# Patient Record
Sex: Female | Born: 1965 | Race: Black or African American | Hispanic: No | Marital: Married | Smoking: Never smoker
Health system: Southern US, Community
[De-identification: ages and names within clinical notes are randomized; demographics above are authoritative.]

## PROBLEM LIST (undated history)

## (undated) ENCOUNTER — Ambulatory Visit

## (undated) DIAGNOSIS — M5137 Other intervertebral disc degeneration, lumbosacral region: Secondary | ICD-10-CM

## (undated) DIAGNOSIS — D649 Anemia, unspecified: Secondary | ICD-10-CM

## (undated) DIAGNOSIS — K219 Gastro-esophageal reflux disease without esophagitis: Secondary | ICD-10-CM

## (undated) DIAGNOSIS — I4949 Other premature depolarization: Secondary | ICD-10-CM

## (undated) DIAGNOSIS — M25569 Pain in unspecified knee: Secondary | ICD-10-CM

## (undated) DIAGNOSIS — N83209 Unspecified ovarian cyst, unspecified side: Secondary | ICD-10-CM

## (undated) DIAGNOSIS — M5126 Other intervertebral disc displacement, lumbar region: Secondary | ICD-10-CM

## (undated) DIAGNOSIS — Z1509 Genetic susceptibility to other malignant neoplasm: Secondary | ICD-10-CM

## (undated) DIAGNOSIS — R112 Nausea with vomiting, unspecified: Secondary | ICD-10-CM

## (undated) DIAGNOSIS — Z15068 Genetic susceptibility to other malignant neoplasm of digestive system: Secondary | ICD-10-CM

## (undated) DIAGNOSIS — K869 Disease of pancreas, unspecified: Secondary | ICD-10-CM

## (undated) DIAGNOSIS — Z973 Presence of spectacles and contact lenses: Secondary | ICD-10-CM

## (undated) DIAGNOSIS — B009 Herpesviral infection, unspecified: Secondary | ICD-10-CM

## (undated) DIAGNOSIS — K635 Polyp of colon: Secondary | ICD-10-CM

## (undated) DIAGNOSIS — R5383 Other fatigue: Secondary | ICD-10-CM

## (undated) DIAGNOSIS — J00 Acute nasopharyngitis [common cold]: Secondary | ICD-10-CM

## (undated) DIAGNOSIS — T7840XA Allergy, unspecified, initial encounter: Secondary | ICD-10-CM

## (undated) DIAGNOSIS — L0291 Cutaneous abscess, unspecified: Secondary | ICD-10-CM

## (undated) DIAGNOSIS — Z8719 Personal history of other diseases of the digestive system: Secondary | ICD-10-CM

## (undated) DIAGNOSIS — K5909 Other constipation: Secondary | ICD-10-CM

## (undated) DIAGNOSIS — R42 Dizziness and giddiness: Secondary | ICD-10-CM

## (undated) DIAGNOSIS — G44209 Tension-type headache, unspecified, not intractable: Secondary | ICD-10-CM

## (undated) DIAGNOSIS — L0390 Cellulitis, unspecified: Secondary | ICD-10-CM

## (undated) DIAGNOSIS — J309 Allergic rhinitis, unspecified: Secondary | ICD-10-CM

## (undated) DIAGNOSIS — R21 Rash and other nonspecific skin eruption: Secondary | ICD-10-CM

## (undated) DIAGNOSIS — R0602 Shortness of breath: Secondary | ICD-10-CM

## (undated) DIAGNOSIS — R002 Palpitations: Secondary | ICD-10-CM

## (undated) DIAGNOSIS — I83893 Varicose veins of bilateral lower extremities with other complications: Secondary | ICD-10-CM

## (undated) DIAGNOSIS — M5432 Sciatica, left side: Secondary | ICD-10-CM

## (undated) DIAGNOSIS — R1011 Right upper quadrant pain: Secondary | ICD-10-CM

## (undated) DIAGNOSIS — D573 Sickle-cell trait: Secondary | ICD-10-CM

## (undated) DIAGNOSIS — Z8672 Personal history of thrombophlebitis: Secondary | ICD-10-CM

## (undated) DIAGNOSIS — R5381 Other malaise: Secondary | ICD-10-CM

## (undated) DIAGNOSIS — G43909 Migraine, unspecified, not intractable, without status migrainosus: Secondary | ICD-10-CM

## (undated) DIAGNOSIS — M549 Dorsalgia, unspecified: Secondary | ICD-10-CM

## (undated) DIAGNOSIS — R079 Chest pain, unspecified: Secondary | ICD-10-CM

## (undated) DIAGNOSIS — Z9889 Other specified postprocedural states: Secondary | ICD-10-CM

## (undated) DIAGNOSIS — F419 Anxiety disorder, unspecified: Secondary | ICD-10-CM

## (undated) HISTORY — DX: Tension-type headache, unspecified, not intractable: G44.209

## (undated) HISTORY — DX: Rash and other nonspecific skin eruption: R21

## (undated) HISTORY — PX: UPPER GASTROINTESTINAL ENDOSCOPY: SHX188

## (undated) HISTORY — DX: Disease of pancreas, unspecified: K86.9

## (undated) HISTORY — DX: Cellulitis, unspecified: L03.90

## (undated) HISTORY — DX: Anxiety disorder, unspecified: F41.9

## (undated) HISTORY — DX: Allergy, unspecified, initial encounter: T78.40XA

## (undated) HISTORY — DX: Cutaneous abscess, unspecified: L02.91

## (undated) HISTORY — DX: Pain in unspecified knee: M25.569

## (undated) HISTORY — DX: Sickle-cell trait: D57.3

## (undated) HISTORY — DX: Other premature depolarization: I49.49

## (undated) HISTORY — DX: Other malaise: R53.81

## (undated) HISTORY — DX: Migraine, unspecified, not intractable, without status migrainosus: G43.909

## (undated) HISTORY — DX: Shortness of breath: R06.02

## (undated) HISTORY — DX: Other constipation: K59.09

## (undated) HISTORY — DX: Personal history of thrombophlebitis: Z86.72

## (undated) HISTORY — DX: Right upper quadrant pain: R10.11

## (undated) HISTORY — DX: Polyp of colon: K63.5

## (undated) HISTORY — DX: Dorsalgia, unspecified: M54.9

## (undated) HISTORY — DX: Other fatigue: R53.83

## (undated) HISTORY — DX: Gastro-esophageal reflux disease without esophagitis: K21.9

## (undated) HISTORY — DX: Varicose veins of bilateral lower extremities with other complications: I83.893

## (undated) HISTORY — DX: Dizziness and giddiness: R42

## (undated) HISTORY — DX: Palpitations: R00.2

## (undated) HISTORY — DX: Unspecified ovarian cyst, unspecified side: N83.209

## (undated) HISTORY — DX: Chest pain, unspecified: R07.9

## (undated) HISTORY — DX: Acute nasopharyngitis (common cold): J00

## (undated) HISTORY — DX: Sciatica, left side: M54.32

## (undated) HISTORY — DX: Other intervertebral disc degeneration, lumbosacral region: M51.37

## (undated) HISTORY — DX: Allergic rhinitis, unspecified: J30.9

---

## 1998-07-02 ENCOUNTER — Encounter: Payer: Self-pay | Admitting: Internal Medicine

## 1998-07-02 ENCOUNTER — Ambulatory Visit (HOSPITAL_COMMUNITY): Admission: RE | Admit: 1998-07-02 | Discharge: 1998-07-02 | Payer: Self-pay | Admitting: Internal Medicine

## 1999-09-17 ENCOUNTER — Other Ambulatory Visit: Admission: RE | Admit: 1999-09-17 | Discharge: 1999-09-17 | Payer: Self-pay | Admitting: Obstetrics and Gynecology

## 2000-10-05 ENCOUNTER — Other Ambulatory Visit: Admission: RE | Admit: 2000-10-05 | Discharge: 2000-10-05 | Payer: Self-pay | Admitting: Obstetrics and Gynecology

## 2001-09-27 ENCOUNTER — Other Ambulatory Visit: Admission: RE | Admit: 2001-09-27 | Discharge: 2001-09-27 | Payer: Self-pay | Admitting: Obstetrics and Gynecology

## 2001-10-10 ENCOUNTER — Encounter: Admission: RE | Admit: 2001-10-10 | Discharge: 2001-10-10 | Payer: Self-pay | Admitting: Internal Medicine

## 2001-10-10 ENCOUNTER — Encounter: Payer: Self-pay | Admitting: Internal Medicine

## 2002-07-19 ENCOUNTER — Encounter: Payer: Self-pay | Admitting: Internal Medicine

## 2002-09-03 ENCOUNTER — Other Ambulatory Visit: Admission: RE | Admit: 2002-09-03 | Discharge: 2002-09-03 | Payer: Self-pay | Admitting: Gynecology

## 2002-10-11 ENCOUNTER — Encounter: Admission: RE | Admit: 2002-10-11 | Discharge: 2003-01-09 | Payer: Self-pay | Admitting: Gynecology

## 2003-04-16 ENCOUNTER — Inpatient Hospital Stay (HOSPITAL_COMMUNITY): Admission: AD | Admit: 2003-04-16 | Discharge: 2003-04-20 | Payer: Self-pay | Admitting: Gynecology

## 2003-04-17 ENCOUNTER — Encounter (INDEPENDENT_AMBULATORY_CARE_PROVIDER_SITE_OTHER): Payer: Self-pay

## 2003-05-27 ENCOUNTER — Other Ambulatory Visit: Admission: RE | Admit: 2003-05-27 | Discharge: 2003-05-27 | Payer: Self-pay | Admitting: Gynecology

## 2003-08-25 ENCOUNTER — Encounter: Admission: RE | Admit: 2003-08-25 | Discharge: 2003-08-25 | Payer: Self-pay | Admitting: Family Medicine

## 2004-05-28 ENCOUNTER — Ambulatory Visit: Payer: Self-pay | Admitting: Internal Medicine

## 2004-08-06 ENCOUNTER — Other Ambulatory Visit: Admission: RE | Admit: 2004-08-06 | Discharge: 2004-08-06 | Payer: Self-pay | Admitting: Internal Medicine

## 2004-08-06 ENCOUNTER — Ambulatory Visit: Payer: Self-pay | Admitting: Internal Medicine

## 2004-11-09 ENCOUNTER — Ambulatory Visit: Payer: Self-pay | Admitting: Cardiology

## 2004-11-09 ENCOUNTER — Ambulatory Visit: Payer: Self-pay | Admitting: Internal Medicine

## 2004-11-12 ENCOUNTER — Ambulatory Visit: Payer: Self-pay | Admitting: Internal Medicine

## 2004-12-02 ENCOUNTER — Encounter: Admission: RE | Admit: 2004-12-02 | Discharge: 2004-12-02 | Payer: Self-pay | Admitting: Emergency Medicine

## 2005-03-30 ENCOUNTER — Ambulatory Visit: Payer: Self-pay | Admitting: *Deleted

## 2005-08-12 ENCOUNTER — Other Ambulatory Visit: Admission: RE | Admit: 2005-08-12 | Discharge: 2005-08-12 | Payer: Self-pay | Admitting: Obstetrics and Gynecology

## 2006-08-10 ENCOUNTER — Emergency Department (HOSPITAL_COMMUNITY): Admission: EM | Admit: 2006-08-10 | Discharge: 2006-08-10 | Payer: Self-pay | Admitting: Emergency Medicine

## 2006-08-11 ENCOUNTER — Encounter: Payer: Self-pay | Admitting: Vascular Surgery

## 2006-08-11 ENCOUNTER — Ambulatory Visit: Payer: Self-pay | Admitting: Internal Medicine

## 2006-08-11 ENCOUNTER — Inpatient Hospital Stay (HOSPITAL_COMMUNITY): Admission: AD | Admit: 2006-08-11 | Discharge: 2006-08-13 | Payer: Self-pay | Admitting: Internal Medicine

## 2006-08-11 ENCOUNTER — Ambulatory Visit (HOSPITAL_COMMUNITY): Admission: RE | Admit: 2006-08-11 | Discharge: 2006-08-11 | Payer: Self-pay | Admitting: Emergency Medicine

## 2006-08-15 LAB — CONVERTED CEMR LAB
Basophils Absolute: 0 10*3/uL (ref 0.0–0.1)
Eosinophils Absolute: 0.1 10*3/uL (ref 0.0–0.6)
Eosinophils Relative: 2.8 % (ref 0.0–5.0)
Lymphocytes Relative: 42.5 % (ref 12.0–46.0)
MCHC: 33.3 g/dL (ref 30.0–36.0)
MCV: 82.1 fL (ref 78.0–100.0)
Neutro Abs: 2.2 10*3/uL (ref 1.4–7.7)
Platelets: 244 10*3/uL (ref 150–400)
RBC: 4.9 M/uL (ref 3.87–5.11)
WBC: 4.3 10*3/uL — ABNORMAL LOW (ref 4.5–10.5)

## 2006-08-18 ENCOUNTER — Ambulatory Visit: Payer: Self-pay | Admitting: Internal Medicine

## 2006-08-19 ENCOUNTER — Ambulatory Visit: Payer: Self-pay | Admitting: Internal Medicine

## 2006-08-24 ENCOUNTER — Ambulatory Visit: Payer: Self-pay | Admitting: Cardiology

## 2006-08-31 ENCOUNTER — Ambulatory Visit: Payer: Self-pay | Admitting: *Deleted

## 2006-09-05 ENCOUNTER — Emergency Department (HOSPITAL_COMMUNITY): Admission: EM | Admit: 2006-09-05 | Discharge: 2006-09-06 | Payer: Self-pay | Admitting: Emergency Medicine

## 2006-09-06 ENCOUNTER — Emergency Department (HOSPITAL_COMMUNITY): Admission: EM | Admit: 2006-09-06 | Discharge: 2006-09-06 | Payer: Self-pay | Admitting: Emergency Medicine

## 2006-09-07 ENCOUNTER — Ambulatory Visit: Payer: Self-pay | Admitting: Cardiovascular Disease

## 2006-09-14 ENCOUNTER — Ambulatory Visit: Payer: Self-pay | Admitting: Internal Medicine

## 2006-09-23 ENCOUNTER — Ambulatory Visit: Payer: Self-pay | Admitting: Cardiology

## 2006-09-27 ENCOUNTER — Ambulatory Visit: Payer: Self-pay | Admitting: Internal Medicine

## 2006-10-05 ENCOUNTER — Ambulatory Visit: Payer: Self-pay | Admitting: Cardiovascular Disease

## 2006-10-19 ENCOUNTER — Ambulatory Visit: Payer: Self-pay | Admitting: Cardiology

## 2006-10-21 ENCOUNTER — Ambulatory Visit: Payer: Self-pay | Admitting: Internal Medicine

## 2006-11-04 ENCOUNTER — Ambulatory Visit: Payer: Self-pay | Admitting: Cardiology

## 2006-11-11 ENCOUNTER — Ambulatory Visit: Payer: Self-pay | Admitting: Cardiology

## 2006-11-17 ENCOUNTER — Ambulatory Visit: Payer: Self-pay | Admitting: Cardiovascular Disease

## 2006-11-24 ENCOUNTER — Ambulatory Visit: Payer: Self-pay | Admitting: Cardiology

## 2006-11-28 ENCOUNTER — Ambulatory Visit: Payer: Self-pay | Admitting: Internal Medicine

## 2006-11-28 LAB — CONVERTED CEMR LAB
Basophils Relative: 1.1 % — ABNORMAL HIGH (ref 0.0–1.0)
Eosinophils Absolute: 0.1 10*3/uL (ref 0.0–0.6)
Lymphocytes Relative: 39.4 % (ref 12.0–46.0)
MCV: 79.6 fL (ref 78.0–100.0)
Monocytes Relative: 7.8 % (ref 3.0–11.0)
Neutro Abs: 2.2 10*3/uL (ref 1.4–7.7)
Platelets: 300 10*3/uL (ref 150–400)
Prothrombin Time: 18.4 s — ABNORMAL HIGH (ref 10.0–14.0)
RBC: 4.82 M/uL (ref 3.87–5.11)

## 2006-12-02 ENCOUNTER — Ambulatory Visit: Payer: Self-pay | Admitting: Cardiology

## 2006-12-06 ENCOUNTER — Ambulatory Visit: Payer: Self-pay | Admitting: Cardiology

## 2006-12-06 ENCOUNTER — Ambulatory Visit: Payer: Self-pay

## 2006-12-19 ENCOUNTER — Ambulatory Visit: Payer: Self-pay | Admitting: Internal Medicine

## 2006-12-19 ENCOUNTER — Ambulatory Visit: Payer: Self-pay | Admitting: Cardiovascular Disease

## 2006-12-29 ENCOUNTER — Ambulatory Visit: Payer: Self-pay | Admitting: Cardiology

## 2007-01-13 ENCOUNTER — Ambulatory Visit: Payer: Self-pay | Admitting: Cardiology

## 2007-01-17 ENCOUNTER — Ambulatory Visit: Payer: Self-pay | Admitting: Cardiology

## 2007-01-30 ENCOUNTER — Ambulatory Visit: Payer: Self-pay | Admitting: Internal Medicine

## 2007-02-27 ENCOUNTER — Ambulatory Visit: Payer: Self-pay | Admitting: Internal Medicine

## 2007-02-27 LAB — CONVERTED CEMR LAB
AntiThromb III Func: 107 % (ref 76–126)
Anticardiolipin IgG: <7 (ref <11)
Homocysteine: 11.3 micromoles/L (ref 4.0–15.4)

## 2007-03-01 ENCOUNTER — Ambulatory Visit: Payer: Self-pay | Admitting: Internal Medicine

## 2007-03-01 ENCOUNTER — Ambulatory Visit: Payer: Self-pay | Admitting: *Deleted

## 2007-03-01 ENCOUNTER — Inpatient Hospital Stay (HOSPITAL_COMMUNITY): Admission: EM | Admit: 2007-03-01 | Discharge: 2007-03-03 | Payer: Self-pay | Admitting: Emergency Medicine

## 2007-03-02 ENCOUNTER — Encounter: Payer: Self-pay | Admitting: Cardiology

## 2007-03-06 ENCOUNTER — Ambulatory Visit: Payer: Self-pay

## 2007-03-06 ENCOUNTER — Ambulatory Visit: Payer: Self-pay | Admitting: Cardiology

## 2007-03-07 ENCOUNTER — Encounter: Payer: Self-pay | Admitting: Internal Medicine

## 2007-03-07 DIAGNOSIS — F411 Generalized anxiety disorder: Secondary | ICD-10-CM | POA: Insufficient documentation

## 2007-03-21 ENCOUNTER — Ambulatory Visit: Payer: Self-pay | Admitting: Cardiology

## 2007-04-05 ENCOUNTER — Ambulatory Visit: Payer: Self-pay | Admitting: Internal Medicine

## 2007-04-05 ENCOUNTER — Ambulatory Visit: Payer: Self-pay

## 2007-04-07 ENCOUNTER — Ambulatory Visit: Payer: Self-pay | Admitting: Cardiovascular Disease

## 2007-04-09 ENCOUNTER — Encounter: Payer: Self-pay | Admitting: Internal Medicine

## 2007-04-09 DIAGNOSIS — K5909 Other constipation: Secondary | ICD-10-CM

## 2007-04-09 DIAGNOSIS — D573 Sickle-cell trait: Secondary | ICD-10-CM

## 2007-04-09 DIAGNOSIS — G44209 Tension-type headache, unspecified, not intractable: Secondary | ICD-10-CM | POA: Insufficient documentation

## 2007-04-09 DIAGNOSIS — Z8672 Personal history of thrombophlebitis: Secondary | ICD-10-CM

## 2007-04-09 DIAGNOSIS — G43909 Migraine, unspecified, not intractable, without status migrainosus: Secondary | ICD-10-CM | POA: Insufficient documentation

## 2007-04-09 HISTORY — DX: Other constipation: K59.09

## 2007-04-09 HISTORY — DX: Migraine, unspecified, not intractable, without status migrainosus: G43.909

## 2007-04-09 HISTORY — DX: Personal history of thrombophlebitis: Z86.72

## 2007-04-09 HISTORY — DX: Sickle-cell trait: D57.3

## 2007-04-09 HISTORY — DX: Tension-type headache, unspecified, not intractable: G44.209

## 2007-04-10 ENCOUNTER — Ambulatory Visit (HOSPITAL_COMMUNITY): Admission: RE | Admit: 2007-04-10 | Discharge: 2007-04-10 | Payer: Self-pay | Admitting: Cardiovascular Disease

## 2007-04-19 ENCOUNTER — Ambulatory Visit: Payer: Self-pay | Admitting: Internal Medicine

## 2007-05-12 ENCOUNTER — Ambulatory Visit: Payer: Self-pay | Admitting: Internal Medicine

## 2007-05-12 DIAGNOSIS — M51379 Other intervertebral disc degeneration, lumbosacral region without mention of lumbar back pain or lower extremity pain: Secondary | ICD-10-CM

## 2007-05-12 DIAGNOSIS — M5137 Other intervertebral disc degeneration, lumbosacral region: Secondary | ICD-10-CM

## 2007-05-12 HISTORY — DX: Other intervertebral disc degeneration, lumbosacral region: M51.37

## 2007-05-12 HISTORY — DX: Other intervertebral disc degeneration, lumbosacral region without mention of lumbar back pain or lower extremity pain: M51.379

## 2007-08-10 ENCOUNTER — Telehealth: Payer: Self-pay | Admitting: Internal Medicine

## 2007-09-11 ENCOUNTER — Ambulatory Visit: Payer: Self-pay | Admitting: Internal Medicine

## 2007-09-11 DIAGNOSIS — M25569 Pain in unspecified knee: Secondary | ICD-10-CM | POA: Insufficient documentation

## 2007-09-11 DIAGNOSIS — I83893 Varicose veins of bilateral lower extremities with other complications: Secondary | ICD-10-CM

## 2007-09-11 HISTORY — DX: Pain in unspecified knee: M25.569

## 2007-09-11 HISTORY — DX: Varicose veins of bilateral lower extremities with other complications: I83.893

## 2007-09-12 ENCOUNTER — Encounter: Payer: Self-pay | Admitting: Internal Medicine

## 2007-09-20 ENCOUNTER — Encounter: Payer: Self-pay | Admitting: Internal Medicine

## 2008-01-17 ENCOUNTER — Ambulatory Visit: Payer: Self-pay | Admitting: Internal Medicine

## 2008-01-17 ENCOUNTER — Telehealth (INDEPENDENT_AMBULATORY_CARE_PROVIDER_SITE_OTHER): Payer: Self-pay | Admitting: *Deleted

## 2008-01-17 DIAGNOSIS — R21 Rash and other nonspecific skin eruption: Secondary | ICD-10-CM

## 2008-01-17 HISTORY — DX: Rash and other nonspecific skin eruption: R21

## 2008-03-13 ENCOUNTER — Ambulatory Visit: Payer: Self-pay | Admitting: Internal Medicine

## 2008-03-13 DIAGNOSIS — J3501 Chronic tonsillitis: Secondary | ICD-10-CM

## 2008-04-04 ENCOUNTER — Encounter: Payer: Self-pay | Admitting: Internal Medicine

## 2008-05-20 ENCOUNTER — Ambulatory Visit: Payer: Self-pay | Admitting: Internal Medicine

## 2008-05-20 DIAGNOSIS — R5381 Other malaise: Secondary | ICD-10-CM | POA: Insufficient documentation

## 2008-05-20 DIAGNOSIS — R002 Palpitations: Secondary | ICD-10-CM | POA: Insufficient documentation

## 2008-05-20 DIAGNOSIS — R5383 Other fatigue: Secondary | ICD-10-CM

## 2008-05-20 DIAGNOSIS — R0602 Shortness of breath: Secondary | ICD-10-CM | POA: Insufficient documentation

## 2008-05-20 HISTORY — DX: Palpitations: R00.2

## 2008-05-20 HISTORY — DX: Other malaise: R53.81

## 2008-05-20 HISTORY — DX: Shortness of breath: R06.02

## 2008-05-22 ENCOUNTER — Emergency Department (HOSPITAL_BASED_OUTPATIENT_CLINIC_OR_DEPARTMENT_OTHER): Admission: EM | Admit: 2008-05-22 | Discharge: 2008-05-22 | Payer: Self-pay | Admitting: Emergency Medicine

## 2008-06-03 ENCOUNTER — Ambulatory Visit: Payer: Self-pay | Admitting: Internal Medicine

## 2008-06-10 ENCOUNTER — Ambulatory Visit: Payer: Self-pay

## 2008-06-10 ENCOUNTER — Encounter: Payer: Self-pay | Admitting: Internal Medicine

## 2008-06-20 ENCOUNTER — Telehealth: Payer: Self-pay | Admitting: Internal Medicine

## 2008-07-16 ENCOUNTER — Encounter (INDEPENDENT_AMBULATORY_CARE_PROVIDER_SITE_OTHER): Payer: Self-pay | Admitting: Cardiology

## 2008-07-16 ENCOUNTER — Ambulatory Visit (HOSPITAL_COMMUNITY): Admission: RE | Admit: 2008-07-16 | Discharge: 2008-07-16 | Payer: Self-pay | Admitting: Cardiology

## 2008-08-08 ENCOUNTER — Encounter: Payer: Self-pay | Admitting: Internal Medicine

## 2008-08-15 ENCOUNTER — Ambulatory Visit: Payer: Self-pay | Admitting: Internal Medicine

## 2008-08-15 DIAGNOSIS — R42 Dizziness and giddiness: Secondary | ICD-10-CM | POA: Insufficient documentation

## 2008-08-15 HISTORY — DX: Dizziness and giddiness: R42

## 2008-08-19 ENCOUNTER — Ambulatory Visit: Payer: Self-pay

## 2008-08-21 ENCOUNTER — Ambulatory Visit: Payer: Self-pay | Admitting: Internal Medicine

## 2008-08-21 ENCOUNTER — Ambulatory Visit: Payer: Self-pay | Admitting: Cardiology

## 2008-08-21 ENCOUNTER — Telehealth: Payer: Self-pay | Admitting: Internal Medicine

## 2008-08-21 LAB — CONVERTED CEMR LAB
CO2: 27 meq/L (ref 19–32)
Glucose, Bld: 89 mg/dL (ref 70–99)
Hemoglobin: 12.5 g/dL (ref 12.0–15.0)
Lymphocytes Relative: 37.4 % (ref 12.0–46.0)
Monocytes Relative: 7.2 % (ref 3.0–12.0)
Neutro Abs: 2.1 10*3/uL (ref 1.4–7.7)
Platelets: 209 10*3/uL (ref 150–400)
Potassium: 3.4 meq/L — ABNORMAL LOW (ref 3.5–5.1)
RDW: 14.9 % — ABNORMAL HIGH (ref 11.5–14.6)
Sodium: 141 meq/L (ref 135–145)

## 2008-08-23 ENCOUNTER — Ambulatory Visit: Payer: Self-pay | Admitting: Internal Medicine

## 2008-08-23 DIAGNOSIS — I4949 Other premature depolarization: Secondary | ICD-10-CM

## 2008-08-23 DIAGNOSIS — R079 Chest pain, unspecified: Secondary | ICD-10-CM

## 2008-08-23 DIAGNOSIS — R0789 Other chest pain: Secondary | ICD-10-CM | POA: Insufficient documentation

## 2008-08-23 HISTORY — DX: Other premature depolarization: I49.49

## 2008-08-23 HISTORY — DX: Chest pain, unspecified: R07.9

## 2008-11-04 ENCOUNTER — Telehealth: Payer: Self-pay | Admitting: Internal Medicine

## 2009-03-01 ENCOUNTER — Emergency Department (HOSPITAL_COMMUNITY): Admission: EM | Admit: 2009-03-01 | Discharge: 2009-03-01 | Payer: Self-pay | Admitting: Emergency Medicine

## 2009-07-18 HISTORY — PX: TONSILLECTOMY: SUR1361

## 2009-08-06 ENCOUNTER — Emergency Department (HOSPITAL_COMMUNITY): Admission: EM | Admit: 2009-08-06 | Discharge: 2009-08-06 | Payer: Self-pay | Admitting: Emergency Medicine

## 2009-10-10 ENCOUNTER — Ambulatory Visit: Payer: Self-pay | Admitting: Internal Medicine

## 2010-01-13 ENCOUNTER — Ambulatory Visit: Payer: Self-pay | Admitting: Internal Medicine

## 2010-01-13 DIAGNOSIS — L0291 Cutaneous abscess, unspecified: Secondary | ICD-10-CM

## 2010-01-13 DIAGNOSIS — L0390 Cellulitis, unspecified: Secondary | ICD-10-CM

## 2010-01-13 HISTORY — DX: Cutaneous abscess, unspecified: L02.91

## 2010-02-05 ENCOUNTER — Ambulatory Visit: Payer: Self-pay | Admitting: Internal Medicine

## 2010-02-05 DIAGNOSIS — M549 Dorsalgia, unspecified: Secondary | ICD-10-CM

## 2010-02-05 HISTORY — DX: Dorsalgia, unspecified: M54.9

## 2010-02-21 ENCOUNTER — Emergency Department (HOSPITAL_COMMUNITY): Admission: EM | Admit: 2010-02-21 | Discharge: 2010-02-21 | Payer: Self-pay | Admitting: Family Medicine

## 2010-02-23 ENCOUNTER — Ambulatory Visit: Payer: Self-pay | Admitting: Internal Medicine

## 2010-02-23 ENCOUNTER — Encounter: Admission: RE | Admit: 2010-02-23 | Discharge: 2010-02-23 | Payer: Self-pay | Admitting: Internal Medicine

## 2010-02-23 DIAGNOSIS — R1011 Right upper quadrant pain: Secondary | ICD-10-CM

## 2010-02-23 HISTORY — DX: Right upper quadrant pain: R10.11

## 2010-02-23 LAB — CONVERTED CEMR LAB
AST: 21 units/L (ref 0–37)
Albumin: 4.6 g/dL (ref 3.5–5.2)
Alkaline Phosphatase: 41 units/L (ref 39–117)
Basophils Relative: 1.3 % (ref 0.0–3.0)
Bilirubin, Direct: 0.1 mg/dL (ref 0.0–0.3)
CO2: 29 meq/L (ref 19–32)
Calcium: 9.7 mg/dL (ref 8.4–10.5)
Creatinine, Ser: 0.9 mg/dL (ref 0.4–1.2)
Eosinophils Relative: 2.1 % (ref 0.0–5.0)
GFR calc non Af Amer: 88.16 mL/min (ref >60)
H Pylori IgG: NEGATIVE
Hemoglobin, Urine: NEGATIVE
Hemoglobin: 12.6 g/dL (ref 12.0–15.0)
Ketones, ur: NEGATIVE mg/dL
Lymphocytes Relative: 42.6 % (ref 12.0–46.0)
Monocytes Relative: 6.6 % (ref 3.0–12.0)
Neutro Abs: 1.8 10*3/uL (ref 1.4–7.7)
Neutrophils Relative %: 47.4 % (ref 43.0–77.0)
RBC: 4.94 M/uL (ref 3.87–5.11)
Sodium: 140 meq/L (ref 135–145)
Total Protein, Urine: NEGATIVE mg/dL
Total Protein: 8 g/dL (ref 6.0–8.3)
Urine Glucose: NEGATIVE mg/dL
Urobilinogen, UA: 0.2 (ref 0.0–1.0)
WBC: 3.9 10*3/uL — ABNORMAL LOW (ref 4.5–10.5)

## 2010-05-07 ENCOUNTER — Ambulatory Visit: Payer: Self-pay | Admitting: Internal Medicine

## 2010-05-07 DIAGNOSIS — J00 Acute nasopharyngitis [common cold]: Secondary | ICD-10-CM | POA: Insufficient documentation

## 2010-05-07 HISTORY — DX: Acute nasopharyngitis (common cold): J00

## 2010-05-27 IMAGING — CR DG CHEST 2V
2 series · 2 of 2 positions shown · non-contrast
Comparison: CT chest 03/02/2007

CLINICAL DATA: Chest flutters, shortness of breath.

CHEST - 2 VIEW

[view not recorded (1 of 2)]
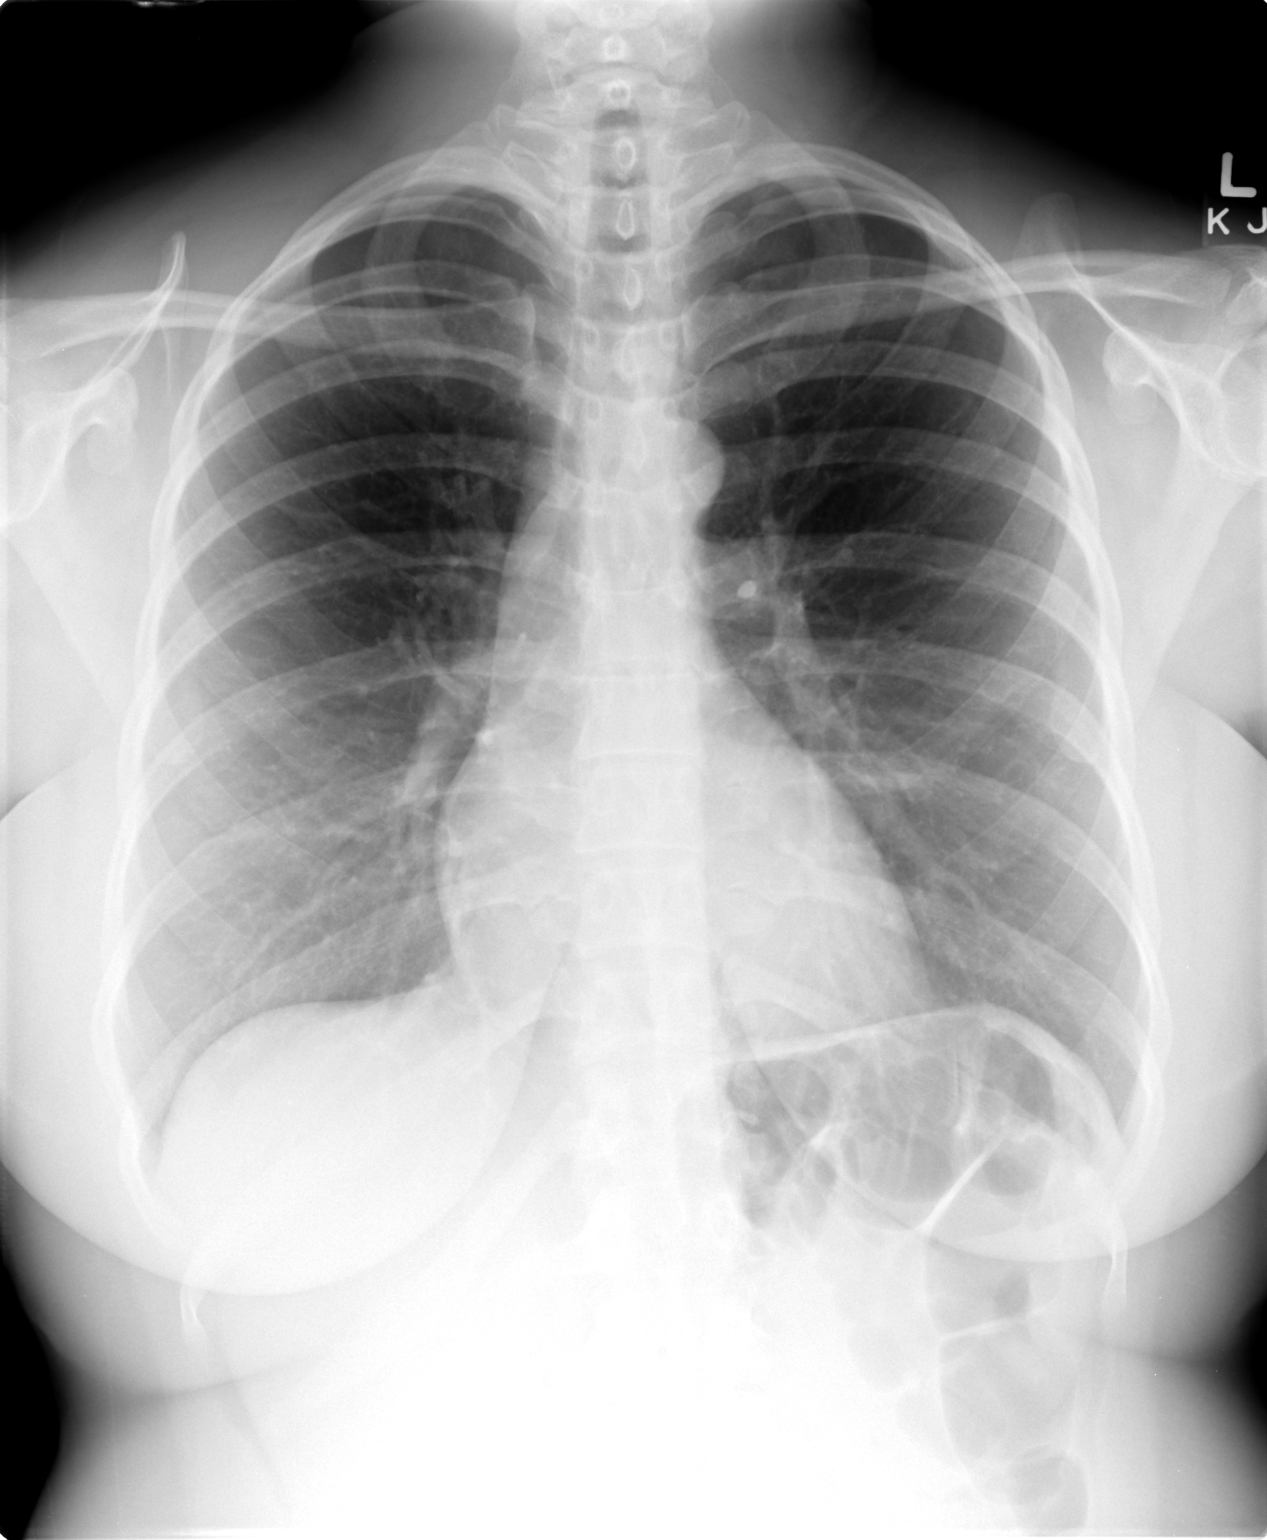

[view not recorded (2 of 2)]
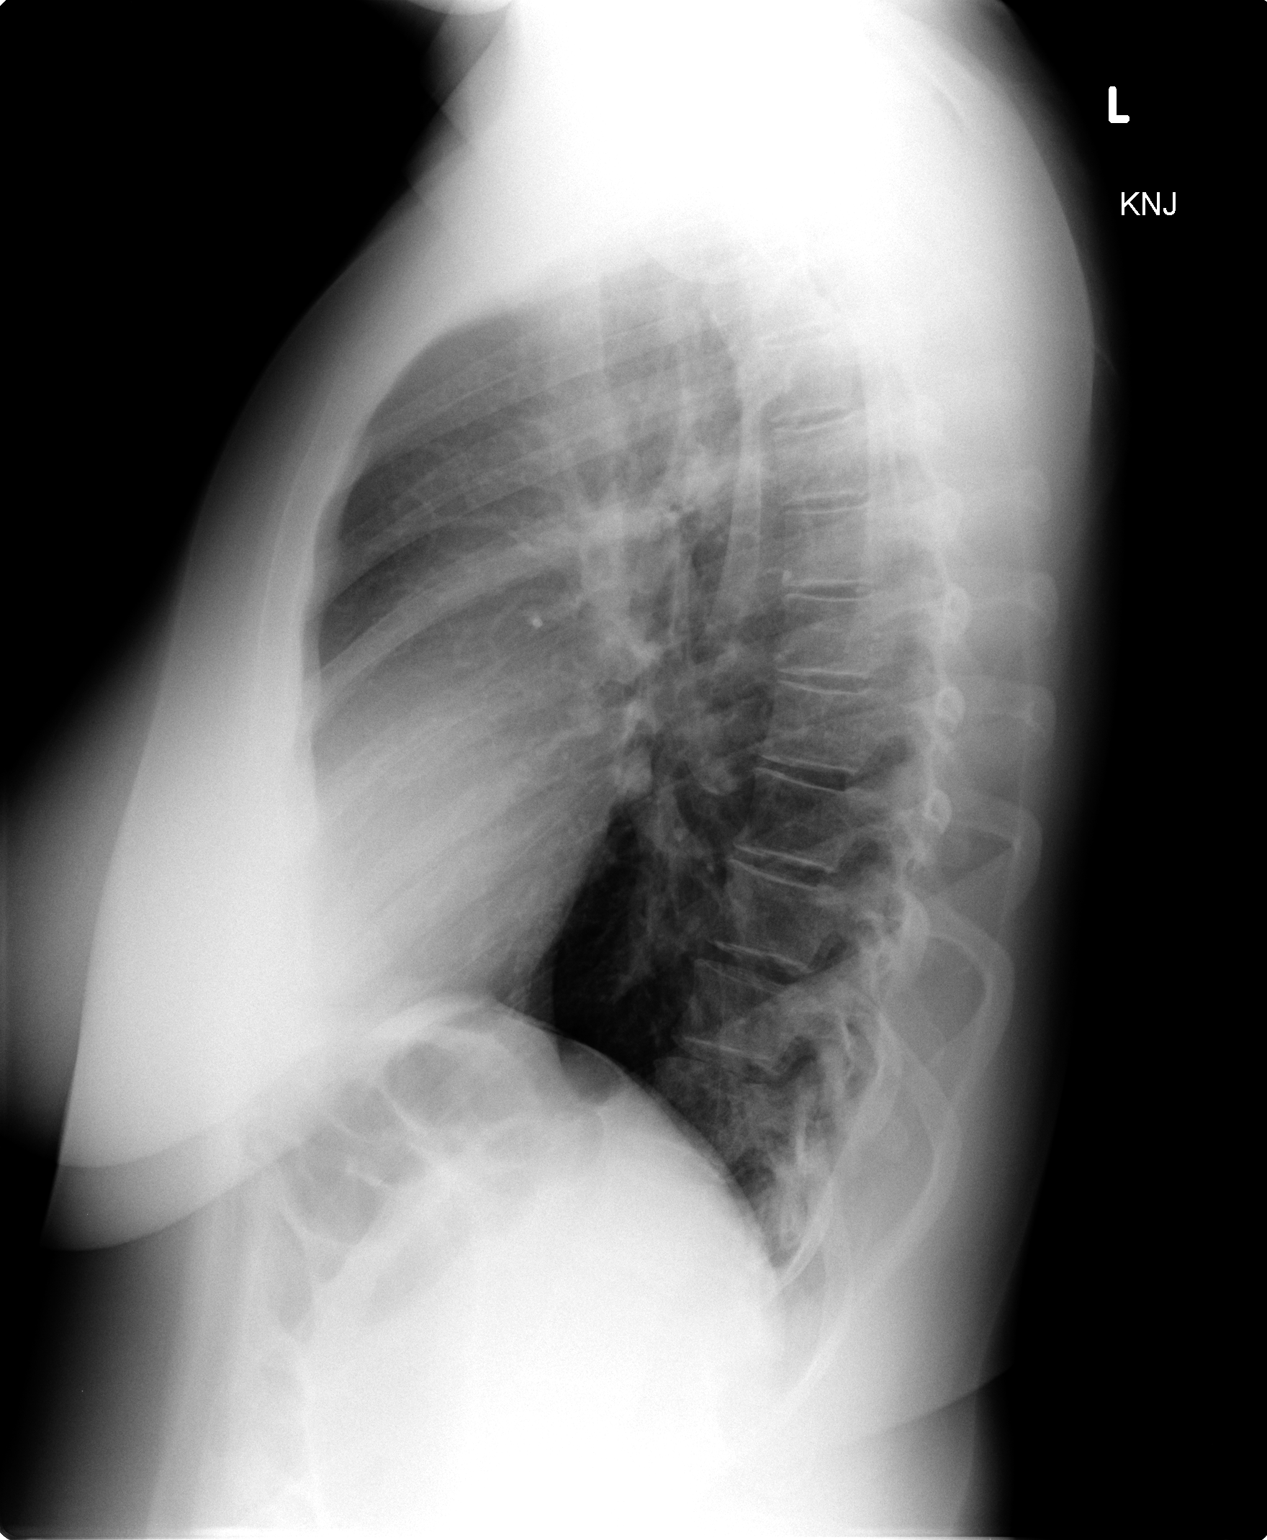

[2 of 2 positions shown; findings below may reference images not displayed]

FINDINGS: Trachea is midline.  Heart size normal.  Lungs are clear.
No pleural fluid.
IMPRESSION: No acute findings.

## 2010-08-09 ENCOUNTER — Encounter: Payer: Self-pay | Admitting: Gastroenterology

## 2010-08-16 LAB — CONVERTED CEMR LAB
ALT: 15 units/L (ref 0–35)
AST: 19 units/L (ref 0–37)
Albumin: 4.5 g/dL (ref 3.5–5.2)
Basophils Absolute: 0.1 10*3/uL (ref 0.0–0.1)
Basophils Relative: 0.7 % (ref 0.0–3.0)
Bilirubin Urine: NEGATIVE
CO2: 29 meq/L (ref 19–32)
CO2: 29 meq/L (ref 19–32)
Calcium: 9.3 mg/dL (ref 8.4–10.5)
Chloride: 105 meq/L (ref 96–112)
Cholesterol: 149 mg/dL (ref 0–200)
Creatinine, Ser: 1 mg/dL (ref 0.4–1.2)
Eosinophils Absolute: 0.1 10*3/uL (ref 0.0–0.7)
Eosinophils Relative: 2.5 % (ref 0.0–5.0)
Folate: 5.3 ng/mL
GFR calc Af Amer: 79 mL/min
Glucose, Bld: 90 mg/dL (ref 70–99)
HCT: 34.3 % — ABNORMAL LOW (ref 36.0–46.0)
HCT: 37.7 % (ref 36.0–46.0)
HDL: 64.6 mg/dL (ref >39.00)
Hemoglobin: 11.6 g/dL — ABNORMAL LOW (ref 12.0–15.0)
Hemoglobin: 12.6 g/dL (ref 12.0–15.0)
Iron: 119 ug/dL (ref 42–145)
Lymphs Abs: 1.8 10*3/uL (ref 0.7–4.0)
MCHC: 33.8 g/dL (ref 30.0–36.0)
Monocytes Relative: 3 % (ref 3.0–12.0)
Neutro Abs: 3.8 10*3/uL (ref 1.4–7.7)
Nitrite: NEGATIVE
Nitrite: NEGATIVE
Potassium: 4.1 meq/L (ref 3.5–5.1)
RBC: 4.7 M/uL (ref 3.87–5.11)
RDW: 18.3 % — ABNORMAL HIGH (ref 11.5–14.6)
Saturation Ratios: 23.9 % (ref 20.0–50.0)
Sed Rate: 10 mm/hr (ref 0–22)
Sodium: 142 meq/L (ref 135–145)
Specific Gravity, Urine: 1.01 (ref 1.000–1.030)
TSH: 1.04 microintl units/mL (ref 0.35–5.50)
Total Bilirubin: 0.8 mg/dL (ref 0.3–1.2)
Total Protein, Urine: NEGATIVE mg/dL
Total Protein, Urine: NEGATIVE mg/dL
Total Protein: 7.8 g/dL (ref 6.0–8.3)
Transferrin: 355 mg/dL (ref 212.0–360.0)
VLDL: 9.4 mg/dL (ref 0.0–40.0)
Vitamin B-12: 373 pg/mL (ref 211–911)
WBC: 4.5 10*3/uL (ref 4.5–10.5)
pH: 5.5 (ref 5.0–8.0)
pH: 7.5 (ref 5.0–8.0)

## 2010-08-20 NOTE — Assessment & Plan Note (Signed)
Summary: EYE INFECTION/NWS   Vital Signs:  Patient profile:   45 year old female Height:      65 inches Weight:      150 pounds BMI:     25.05 O2 Sat:      98 % on Room air Temp:     97.1 degrees F oral Pulse rate:   84 / minute BP sitting:   114 / 76  (left arm) Cuff size:   regular  Vitals Entered ByZella Ball Ewing (October 10, 2009 3:52 PM)  O2 Flow:  Room air CC: eye infection, sore throat/RE   CC:  eye infection and sore throat/RE.  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   No blurred vision or visual problem.    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 1 By Mouth Qd 2)  Alprazolam 0.5 Mg  Tabs (Alprazolam) .Marland Kitchen.. 1 By Mouth Three Times A Day As Needed 3)  Triamcinolone Acetonide 0.5 %  Crea (Triamcinolone Acetonide) ....  Use Asd Two Times A Day As Needed 4)  Metoprolol Tartrate 25 Mg Tabs (Metoprolol Tartrate) .Marland Kitchen.. 1 By Mouth Two Times A Day  Current Medications (verified): 1)  Ecotrin Low Strength 81 Mg  Tbec (Aspirin) .Marland Kitchen.. 1 By Mouth Qd 2)  Alprazolam 0.5 Mg  Tabs (Alprazolam) .Marland Kitchen.. 1 By Mouth Three Times A Day As Needed 3)  Triamcinolone Acetonide 0.5 %  Crea (Triamcinolone Acetonide) .... Use Asd Two Times A Day As Needed 4)  Metoprolol Tartrate 25 Mg Tabs (Metoprolol Tartrate) .Marland Kitchen.. 1 By Mouth Two Times A Day 5)  Azithromycin 200 Mg/71ml Susr (Azithromycin) .Marland Kitchen.. 12.5 Cc By Mouth X 1 Day, Then 10 Cc By Mouth Once Daily For 4 Days 6)  Tobramycin Sulfate 0.3 % Soln (Tobramycin Sulfate) .... 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  , mild ill  Head:  normocephalic and atraumatic.   Eyes:  vision grossly intact, pupils equal, and pupils round., bilat conjucntiva wtih mild erythema and slight d/c   Ears:  bilat tm's mild red, sinus nontender Nose:  nasal dischargemucosal pallor and mucosal edema.   Mouth:  severe pharyngeal erythema and fair dentition.   Neck:  supple and cervical lymphadenopathy.   Lungs:  normal respiratory effort and normal breath sounds.   Heart:  normal rate and regular rhythm.   Extremities:  no edema, no  erythema    Impression & Recommendations:  Problem # 1:  PHARYNGITIS-ACUTE (ICD-462)  Her updated medication list for this problem includes:    Ecotrin Low Strength 81 Mg Tbec (Aspirin) .Marland Kitchen... 1 by mouth qd    Azithromycin 200 Mg/48ml Susr (Azithromycin) .Marland Kitchen... 12.5 cc by mouth x 1 day, then 10 cc by mouth once daily for 4 days treat as above, f/u any worsening signs or symptoms   Problem # 2:  CONJUNCTIVITIS (ICD-372.30)  Her updated medication list for this problem includes:    Tobramycin Sulfate 0.3 % Soln (Tobramycin sulfate) .Marland Kitchen... 2 gtts ou four times per day for 10 days treat as above, f/u any worsening signs or symptoms   Complete Medication List: 1)  Ecotrin Low Strength 81 Mg Tbec (Aspirin) .Marland Kitchen.. 1 by mouth qd 2)  Alprazolam 0.5 Mg Tabs (Alprazolam) .Marland Kitchen.. 1 by mouth three times a day as needed 3)  Triamcinolone Acetonide 0.5 % Crea (Triamcinolone acetonide) .... Use asd two times a day as needed 4)  Metoprolol Tartrate 25 Mg Tabs (Metoprolol tartrate) .Marland Kitchen.. 1 by mouth two times a day 5)  Azithromycin 200 Mg/53ml Susr (Azithromycin) .Marland Kitchen.. 12.5 cc by mouth x 1 day, then 10 cc by mouth once daily for 4 days 6)  Tobramycin Sulfate 0.3 % Soln (Tobramycin sulfate) .... 2 gtts ou four times per day for 10 days  Patient Instructions: 1)  Please take all new medications as prescribed 2)  Continue all previous medications as before this visit  3)  Please schedule a follow-up appointment as needed. Prescriptions: AZITHROMYCIN 200 MG/5ML SUSR (AZITHROMYCIN) 12.5 cc by mouth x 1 day, then 10 cc by mouth once daily for 4 days  #6 days above x 1   Entered and Authorized by:   Corwin Levins MD   Signed by:   Corwin Levins MD on 10/10/2009   Method used:   Electronically to        [facility]  [address] [id number] (retail)       [address]       [place], [place]  [address]       Ph: [phone] or [phone]       Fax: [phone]   RxID:   [id number] TOBRAMYCIN SULFATE  0.3 % SOLN (TOBRAMYCIN SULFATE) 2 gtts OU four times per day for 10 days  #1bottle x 0   Entered and Authorized by:   Corwin Levins MD   Signed by:   Corwin Levins MD on 10/10/2009   Method used:   Print then Give to Patient   RxID:   [id number] AZITHROMYCIN 250 MG TABS (AZITHROMYCIN) 2po qd for 1 day, then 1po qd for 4days, then stop  #6 x 1   Entered and Authorized  by:   Corwin Levins MD   Signed by:   Corwin Levins MD on 10/10/2009   Method used:   Print then Give to Patient   RxID:   [id number]

## 2010-08-20 NOTE — Assessment & Plan Note (Signed)
Summary: TICK BITE/ NWS   Vital Signs:  Patient profile:   45 year old female Height:      65 inches Weight:      150.25 pounds BMI:     25.09 O2 Sat:      97 % on Room air Temp:     98.4 degrees F oral Pulse rate:   86 / minute BP sitting:   100 / 70  (left arm) Cuff size:   regular  Vitals Entered By: Zella Ball Ewing CMA Duncan Dull) (January 13, 2010 2:54 PM)  O2 Flow:  Room air CC: Tick Bite/RE   CC:  Tick Bite/RE.  History of Present Illness: here after found a tick to the right neck base, removed several days ago, but since then has incrased local erythema, tender , swelling, headache, malaise, and an episode of vomiting yesterday as well; right neck seems "sore and hard".  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 1 By Mouth Qd 2)  Alprazolam 0.5 Mg  Tabs (Alprazolam) .Marland Kitchen.. 1 By Mouth Three Times A Day As Needed 3)  Triamcinolone Acetonide 0.5 %  Crea (Triamcinolone Acetonide) .... Use Asd Two Times A Day As Needed 4)  Metoprolol Tartrate 25 Mg  Tabs (Metoprolol Tartrate) .Marland Kitchen.. 1 By Mouth Two Times A Day 5)  Azithromycin 200 Mg/72ml Susr (Azithromycin) .Marland Kitchen.. 12.5 Cc By Mouth X 1 Day, Then 10 Cc By Mouth Once Daily For 4 Days 6)  Tobramycin Sulfate 0.3 % Soln (Tobramycin Sulfate) .... 2 Gtts Ou Four Times Per Day For 10 Days  Current Medications (verified): 1)  Ecotrin Low Strength 81 Mg  Tbec (Aspirin) .Marland Kitchen.. 1 By Mouth Qd 2)  Doxycycline Hyclate 100 Mg Caps (Doxycycline Hyclate) .Marland Kitchen.. 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   Head:  normocephalic and atraumatic.   Eyes:  vision grossly intact, pupils equal, and pupils round.   Ears:  R ear normal and L  ear normal.   Nose:  no external deformity and no nasal discharge.   Mouth:  no gingival abnormalities and pharynx pink and moist.   Neck:  supple and no masses.   Lungs:  normal respiratory effort and normal breath sounds.   Heart:  normal rate and regular rhythm.   Extremities:  no edema, no erythema  Skin:  right neck with right scm lower third with tender LA, and mild erythema/tender approx  3 cm area nondiscrete, no ulceration   Impression & Recommendations:  Problem # 1:  CELLULITIS (ICD-682.9)  The following medications were removed from the medication list:    Azithromycin 200 Mg/62ml Susr (Azithromycin)  .Marland Kitchen... 12.5 cc by mouth x 1 day, then 10 cc by mouth once daily for 4 days Her updated medication list for this problem includes:    Doxycycline Hyclate 100 Mg Caps (Doxycycline hyclate) .Marland Kitchen... 1 by mouth two times a day right base of neck - ? tick related - for doxy course, check labs per pt request  Orders: T-Lyme Disease [id number]) T-[place] Spotted Fever AB IgG IgM ([id number])  Complete Medication List: 1)  Ecotrin Low Strength 81 Mg Tbec (Aspirin) .Marland Kitchen.. 1 by mouth qd 2)  Doxycycline Hyclate 100 Mg Caps (Doxycycline hyclate) .Marland Kitchen.. 1 by mouth two times a day  Patient Instructions: 1)  Please take all new medications as prescribed 2)  Continue all previous medications as before this visit  3)  Please go to the Lab in the basement for your blood and/or urine tests today 4)  Please schedule a follow-up appointment in 1 month with CPX labs Prescriptions: DOXYCYCLINE HYCLATE 100 MG CAPS (DOXYCYCLINE HYCLATE) 1 by mouth two times a day  #20 x 0   Entered and Authorized by:   Corwin Levins MD   Signed by:   Corwin Levins MD on 01/13/2010   Method used:   Print then Give to Patient   RxID:   [id number]

## 2010-08-20 NOTE — Assessment & Plan Note (Signed)
Summary: WENT TO [facility]/ STOMACH AND BACK PAIN/NWS   Vital Signs:  Patient profile:   45 year old female Height:      65 inches Weight:      147.75 pounds BMI:     24.68 O2 Sat:      98 % on Room air Temp:     96.7 degrees F oral Pulse rate:   72 / minute BP sitting:   102 / 80  (left arm) Cuff size:   regular  Vitals Entered By: Zella Ball Ewing CMA (AAMA) (February 23, 2010 12:02 PM)  O2 Flow:  Room air CC: Right side pain, nauseated/Re   CC:  Right side pain and nauseated/Re.  History of Present Illness: here with recent onset 6 days gradually worsening RUQ pain, now moderate, tender to palpate and worse to twist at the waist; no trauma, fever or injury, but pain is aching and seems worse with deep breathing and eating;  saw urgent care 2 days ago  - attempted blood labs but could not complete tests for some reason, she remembers UA was neg and denies n/v/d except for a few dry heaves a few days ago, or other bowel or bladder change;  no back pain/flank or spine pain or LE pain, weak/numb.  No rash, redness or sweling of the area.  Still with GB and no hx of known stones, or renal stones.  No fever, wt loss, night sweats, loss of appetite or other constitutional symptoms Pt denies CP, sob, doe, wheezing, orthopnea, pnd, worsening LE edema, palps, dizziness or syncope   Problems Prior to Update: 1)  Ruq Pain  (ICD-789.01) 2)  Preventive Health Care  (ICD-V70.0) 3)  Back Pain  (ICD-724.5) 4)  Cellulitis  (ICD-682.9) 5)  Premature Ventricular Contractions  (ICD-427.69) 6)  Chest Pain Unspecified  (ICD-786.50) 7)  Dizziness  (ICD-780.4) 8)  Dyspnea  (ICD-786.05) 9)  Fatigue  (ICD-780.79) 10)  Palpitations  (ICD-785.1) 11)  Tonsillitis, Chronic  (ICD-474.00) 12)  Rash-nonvesicular  (ICD-782.1) 13)  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 1 By Mouth Qd 2)  Naproxen 500 Mg Tabs (Naproxen) .Marland Kitchen.. 1po Two Times A Day As Needed  Current Medications (verified): 1)  Ecotrin Low Strength 81 Mg  Tbec (Aspirin) .Marland Kitchen.. 1 By Mouth Qd 2)  Naproxen 500 Mg Tabs (Naproxen) .Marland Kitchen.. 1po Two Times A Day As Needed 3)  Hydrocodone-Acetaminophen 5-325 Mg Tabs (Hydrocodone-Acetaminophen) .Marland Kitchen.. 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   Head:  normocephalic and atraumatic.   Eyes:  vision grossly intact, pupils equal, and pupils round.   Ears:  R ear normal and L ear normal.   Nose:  no external deformity and no nasal discharge.   Mouth:  no gingival abnormalities and pharynx pink and moist.   Neck:  supple and no masses.   Lungs:  normal respiratory effort and normal breath sounds.   Heart:  normal rate and regular rhythm.   Abdomen:  soft and normal bowel sounds.  with tender RUQ and rib cage approx t10 level tender to ruq, lateral and postlat as well;  no spine tender or paravertebral tender,  red or sweling or rash Msk:  no joint tenderness and no joint swelling.   Extremities:  no edema, no erythema  Neurologic:  cranial nerves II-XII intact, strength normal in all extremities, and gait normal.   Skin:  color normal and no rashes.   Psych:  not depressed appearing and moderately anxious.     Impression & Recommendations:  Problem # 1:  RUQ PAIN (ICD-789.01) unclear etiology including GB, pancreatis, gastritis, MSK or neuritc pain;  for labs and u/s, cont same meds for now, med given for pain control Orders: Radiology Referral (Radiology) TLB-BMP (Basic Metabolic Panel-BMET) (80048-METABOL) TLB-CBC Platelet - w/Differential (85025-CBCD) TLB-Hepatic/Liver Function Pnl (80076-HEPATIC) TLB-Lipase (83690-LIPASE) TLB-Udip w/ Micro (81001-URINE) TLB-Sedimentation Rate (ESR) (85652-ESR) TLB-H. Pylori Abs(Helicobacter Pylori) (86677-HELICO)  Complete Medication List: 1)  Ecotrin Low Strength 81 Mg Tbec (Aspirin) .Marland Kitchen.. 1 by mouth qd 2)  Naproxen 500 Mg Tabs (Naproxen) .Marland Kitchen.. 1po two times a day as needed 3)  Hydrocodone-acetaminophen 5-325 Mg Tabs (Hydrocodone-acetaminophen) .Marland Kitchen.. 1po q 6 hrs as needed pain  Patient Instructions: 1)  Please take all new medications as prescribed 2)  Continue all previous medications as before this visit  3)  Please go to the Lab in the basement for your blood and/or urine tests today 4)  You will be contacted about the referral(s) to: ultrasound 5)  Please schedule a follow-up appointment as needed. Prescriptions: HYDROCODONE-ACETAMINOPHEN 5-325 MG TABS (HYDROCODONE-ACETAMINOPHEN) 1po q 6 hrs as needed pain  #50 x 0   Entered and Authorized by:   Corwin Levins MD   Signed by:   Corwin Levins MD on 02/23/2010   Method used:   Print then Give to Patient   RxID:   [id number]

## 2010-08-20 NOTE — Assessment & Plan Note (Signed)
Summary: SORES IN  NOSE , HEADACHES / NWS   Vital Signs:  Patient profile:   45 year old female Height:      65 inches Weight:      150.75 pounds BMI:     25.18 O2 Sat:      98 % on Room air Temp:     98 degrees F oral Pulse rate:   75 / minute BP sitting:   110 / 70  (left arm) Cuff size:   regular  Vitals Entered By: Zella Ball Ewing CMA (AAMA) (May 07, 2010 3:00 PM)  O2 Flow:  Room air CC: Headaches, nose sores and blisters, fatigue/RE   CC:  Headaches, nose sores and blisters, and fatigue/RE.  History of Present Illness: here for acute- with c/o 3 days onset mild to mod headaches, fever, nasal and sinus congestion with greenish d/c, facial pain, pressure, and slight ST, without cough and Pt denies CP, worsening sob, doe, wheezing, orthopnea, pnd, worsening LE edema, palps, dizziness or syncope  Also has several painful sores exspecially to the left inner nose without d/c or blood, differeent from usual cold sores, and assoc with increased fatigue for 1 wk.  No wt loss, night sweats, loss of appetite or other constitutional symptoms Pt denies new neuro symptoms such as headache, facial or extremity weakness  Overall good compliance with meds, good tolerability.  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 1 By Mouth Qd 2)  Naproxen 500 Mg Tabs (Naproxen) .Marland Kitchen.. 1po Two Times A Day As Needed 3)  Hydrocodone-Acetaminophen 5-325 Mg Tabs (Hydrocodone-Acetaminophen) .Marland Kitchen.. 1po Q 6 Hrs As Needed Pain  Current Medications (verified): 1)  Ecotrin Low Strength 81 Mg  Tbec (Aspirin) .Marland Kitchen.. 1 By Mouth Qd 2)  Naproxen 500 Mg Tabs (Naproxen) .Marland Kitchen.. 1po Two Times A Day As Needed 3)  Hydrocodone-Acetaminophen 5-325 Mg Tabs (Hydrocodone-Acetaminophen) .Marland Kitchen.. 1po Q 6 Hrs As Needed Pain 4)  Doxycycline Hyclate 100 Mg Caps (Doxycycline Hyclate) .Marland Kitchen.. 1po Two Times A Day 5)  Mupirocin 2 % Oint (Mupirocin) .... 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   Eyes:  vision grossly intact, pupils equal, and pupils round.   Ears:  left TM mild red, right TM ok, sinus nontender Nose:  left nares with approx 6 mm sore/ulceration, tender without d/c Mouth:  pharyngeal erythema and fair dentition.   Neck:  supple  and cervical lymphadenopathy.   Lungs:  normal respiratory effort and normal breath sounds.   Heart:  normal rate and regular rhythm.   Extremities:  no edema, no erythema    Impression & Recommendations:  Problem # 1:  ACUTE NASOPHARYNGITIS (ICD-460)  Her updated medication list for this problem includes:    Ecotrin Low Strength 81 Mg Tbec (Aspirin) .Marland Kitchen... 1 by mouth qd    Naproxen 500 Mg Tabs (Naproxen) .Marland Kitchen... 1po two times a day as needed treat as above, f/u any worsening signs or symptoms , with doxy course and empiric mupirocin; f/u anhy worsening symptoms  Complete Medication List: 1)  Ecotrin Low Strength 81 Mg Tbec (Aspirin) .Marland Kitchen.. 1 by mouth qd 2)  Naproxen 500 Mg Tabs (Naproxen) .Marland Kitchen.. 1po two times a day as needed 3)  Hydrocodone-acetaminophen 5-325 Mg Tabs (Hydrocodone-acetaminophen) .Marland Kitchen.. 1po q 6 hrs as needed pain 4)  Doxycycline Hyclate 100 Mg Caps (Doxycycline hyclate) .Marland Kitchen.. 1po two times a day 5)  Mupirocin 2 % Oint (Mupirocin) .... Use asd two times a day for 3 days  Patient Instructions: 1)  Please take all new medications as prescribed 2)  Continue all previous medications as before this visit  3)  Please schedule a follow-up appointment in July 2012 wtih CPX labs Prescriptions: MUPIROCIN 2 % OINT (MUPIROCIN) use asd two times a day for 3 days  #1 x 0   Entered and Authorized by:   Corwin Levins MD   Signed by:   Corwin Levins MD on 05/07/2010   Method used:   Print then Give to Patient   RxID:   [id number] DOXYCYCLINE HYCLATE 100 MG CAPS (DOXYCYCLINE HYCLATE) 1po two times a day  #20 x 0   Entered and Authorized by:   Corwin Levins MD   Signed by:   Corwin Levins MD on 05/07/2010   Method used:   Print then Give to Patient   RxID:   [id number]    Orders Added: 1)  Est. Patient Level III [84696]

## 2010-08-20 NOTE — Assessment & Plan Note (Signed)
Summary: CPX/ NEEDED BEFORE 7-31/ OK'D PER JWJ/NWS  #   Vital Signs:  Patient profile:   45 year old female Height:      65 inches Weight:      149.75 pounds BMI:     25.01 O2 Sat:      98 % on Room air Temp:     99.4 degrees F oral Pulse rate:   80 / minute BP sitting:   106 / 62  (left arm) Cuff size:   regular  Vitals Entered By: Zella Ball Ewing CMA Duncan Dull) (February 05, 2010 3:29 PM)  O2 Flow:  Room air  Preventive Care Screening  Pap Smear:    Date:  11/16/2009    Results:  normal   Mammogram:    Date:  02/16/2009    Results:  normal   CC: Adult Physical/RE   CC:  Adult Physical/RE.  History of Present Illness: overall doing well, Pt denies CP, sob, doe, wheezing, orthopnea, pnd, worsening LE edema, palps, dizziness or syncope  Pt denies new neuro symptoms such as headache, facial or extremity weakness   No fever, wt loss, night sweats, loss of appetite or other constitutional symptoms   Preventive Screening-Counseling & Management      Drug Use:  no.    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 1 By Mouth Qd 2)  Doxycycline Hyclate 100 Mg Caps (Doxycycline Hyclate) .Marland Kitchen.. 1 By Mouth Two Times A  Day  Current Medications (verified): 1)  Ecotrin Low Strength 81 Mg  Tbec (Aspirin) .Marland Kitchen.. 1 By Mouth Qd 2)  Naproxen 500 Mg Tabs (Naproxen) .Marland Kitchen.. 1po Two Times A Day As Needed  Allergies (verified): No Known Drug Allergies  Past History:  Past Medical History: Last updated: 08/23/2008 Palplitations associated with PVCs Chest Pain- non cardiac sickle cell trait Anxiety migraines episoidic tension type  h/a chronic constipation Hx of LLE DVT Lumbar disc disease  Past Surgical History: Last updated: 08/23/2008 Tonsillectomy - 2009 C section  Family History: Last updated: 02/05/2010 no DVT 3 aunts with cancer - brain, lung and liver father died with heart disease, and hx of pancreatic stent mother with heart disease, HTN  Social History: Last updated: 02/05/2010 Never Smoked 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 no DVT 3 aunts with cancer - brain, lung and liver father died with heart disease, and hx of pancreatic stent mother with heart disease, HTN  Social History: Reviewed history from 05/20/2008 and no changes required. 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         all otherwise negative per pt -  - palpiataiotns have resolved, now on treadmill 30 mi  3 times per wk; also with mild recurring low midline lumbar back pain, worse with excercise and bending;  no bowel or bladder changes, no gait change ,falls, fever or wt loss  Physical Exam  General:  alert and well-developed.   Head:  normocephalic and atraumatic.   Eyes:  vision grossly intact, pupils equal, and pupils round.   Ears:  R ear normal and L ear normal.   Nose:  no external deformity and no nasal discharge.   Mouth:  no gingival abnormalities and pharynx pink and moist.   Neck:  supple and no masses.   Lungs:  normal respiratory effort and normal breath sounds.   Heart:  normal rate and regular rhythm.   Abdomen:  soft, non-tender, and normal bowel sounds.   Msk:  no joint tenderness and no joint swelling.  , has mild midline lumbar tender without swelling or erythema approx l5 level Extremities:  no edema, no erythema  Neurologic:  cranial nerves II-XII intact, strength normal in all extremities, sensation intact to light touch, gait normal, and DTRs symmetrical and normal.   Skin:  color normal and no rashes.   Psych:  slightly anxious.     Impression & Recommendations:  Problem # 1:  Preventive Health Care (ICD-V70.0)  Overall doing well, age appropriate education and counseling updated and referral for appropriate preventive services done unless declined, immunizations up to date or declined, diet counseling done if overweight, urged to quit smoking if smokes , most recent labs reviewed and current ordered if appropriate, ecg reviewed or declined (interpretation per ECG scanned in the EMR if done); information regarding Medicare Prevention requirements given if appropriate; speciality referrals updated as appropriate   Orders: EKG w/ Interpretation (93000) TLB-BMP (Basic Metabolic Panel-BMET) (80048-METABOL) TLB-CBC Platelet - w/Differential (85025-CBCD) TLB-Hepatic/Liver Function Pnl (80076-HEPATIC) TLB-Lipid Panel (80061-LIPID) TLB-TSH (Thyroid Stimulating  Hormone) (84443-TSH) TLB-Udip ONLY (81003-UDIP)  Problem # 2:  BACK PAIN (ICD-724.5)  Her updated medication list for this problem includes:    Ecotrin Low Strength 81 Mg Tbec (Aspirin) .Marland Kitchen... 1 by mouth qd    Naproxen 500 Mg Tabs (Naproxen) .Marland Kitchen... 1po two times a day as needed c/w ? underlying DJD/DDD - mild, exam benign , for naproxen as needed   Complete Medication List: 1)  Ecotrin Low Strength 81 Mg Tbec (Aspirin) .Marland Kitchen.. 1 by mouth qd 2)  Naproxen 500 Mg Tabs (Naproxen) .Marland Kitchen.. 1po two times a day as needed  Patient Instructions: 1)  Please take all new medications as prescribed 2)  Continue all previous medications as before this visit  3)  Please go to the Lab in the basement for your blood and/or urine tests today  4)  Please call the number on the blue card for results later 5)  Your EKG was OK today 6)  You are otherwise up to date on prevention, except please call for your mammogram as you are due 7)  Please schedule a follow-up appointment in 1 year or sooner if needed Prescriptions: NAPROXEN 500 MG TABS (NAPROXEN) 1po two times  a day as needed  #60 x 3   Entered and Authorized by:   Corwin Levins MD   Signed by:   Corwin Levins MD on 02/05/2010   Method used:   Print then Give to Patient   RxID:   [id number]

## 2010-10-02 LAB — HEPATIC FUNCTION PANEL
ALT: 14 U/L (ref 0–35)
Albumin: 4.4 g/dL (ref 3.5–5.2)
Alkaline Phosphatase: 50 U/L (ref 39–117)
Indirect Bilirubin: 0.5 mg/dL (ref 0.3–0.9)
Total Protein: 8.1 g/dL (ref 6.0–8.3)

## 2010-10-02 LAB — POCT URINALYSIS DIPSTICK
Glucose, UA: NEGATIVE mg/dL
Ketones, ur: NEGATIVE mg/dL
Specific Gravity, Urine: 1.015 (ref 1.005–1.030)

## 2010-10-02 LAB — POCT PREGNANCY, URINE: Preg Test, Ur: NEGATIVE

## 2010-10-04 LAB — DIFFERENTIAL
Basophils Relative: 1 % (ref 0–1)
Eosinophils Absolute: 0.1 10*3/uL (ref 0.0–0.7)
Eosinophils Relative: 1 % (ref 0–5)
Lymphs Abs: 1.8 10*3/uL (ref 0.7–4.0)
Monocytes Absolute: 0.4 10*3/uL (ref 0.1–1.0)
Monocytes Relative: 8 % (ref 3–12)
Neutrophils Relative %: 50 % (ref 43–77)

## 2010-10-04 LAB — CBC
HCT: 42.9 % (ref 36.0–46.0)
Hemoglobin: 14.2 g/dL (ref 12.0–15.0)
MCHC: 33.1 g/dL (ref 30.0–36.0)
MCV: 80.3 fL (ref 78.0–100.0)
RBC: 5.34 MIL/uL — ABNORMAL HIGH (ref 3.87–5.11)
WBC: 4.5 10*3/uL (ref 4.0–10.5)

## 2010-10-04 LAB — POCT CARDIAC MARKERS
Myoglobin, poc: 46.4 ng/mL (ref 12–200)
Troponin i, poc: <0.05 ng/mL (ref 0.00–0.09)
Troponin i, poc: <0.05 ng/mL (ref 0.00–0.09)
Troponin i, poc: <0.05 ng/mL (ref 0.00–0.09)

## 2010-10-04 LAB — BASIC METABOLIC PANEL
CO2: 24 mEq/L (ref 19–32)
Chloride: 106 mEq/L (ref 96–112)
GFR calc Af Amer: >60 mL/min (ref >60)
Potassium: 3.4 mEq/L — ABNORMAL LOW (ref 3.5–5.1)

## 2010-12-01 NOTE — H&P (Signed)
[name], [name]                 ACCOUNT NO.:  [id number]   MEDICAL RECORD NO.:  [id number]          PATIENT TYPE:  INP   LOCATION:  3734                         FACILITY:  [facility]   PHYSICIAN:  Garrison Columbus. Haithcock, MDDATE OF BIRTH:  1966-05-11   DATE OF ADMISSION:  03/01/2007  DATE OF DISCHARGE:                              HISTORY & PHYSICAL   This is on behalf of Dr. Antoine Poche.   CHIEF COMPLAINT:  Chest pain, shortness of breath.   HISTORY OF PRESENT ILLNESS:  This is a 45 year old female with a history  of sickle cell trait.  She has had a history of deep venous thrombosis,  which was diagnosed in January 2008.  It had been treated with Coumadin,  which was discontinued last month, currently undergoing a  hypercoagulable workup.  She had remote history of birth control use,  but no other clear reasons for thromboembolic disease.  With the current  situation, she has been complaining of heart fluttering for  approximately a month, although for the last 8 hours, she has had a  sharp, left-sided chest pain.  It reached its worst today while at work  upright sitting at a computer.  She also noted tenderness, and had  difficulty taking deep breaths.  When the pain was at its worst, it was  10/10, and it was associated with lightheadedness.  The pain has  subsequently resolved, however she has residual chest tightness, rated 1  to 2 out of 10.   PAST MEDICAL HISTORY:  Sickle cell trait, anxiety, migraines, and deep  venous thrombosis as described above.   ALLERGIES:  No known drug allergies.   MEDICATIONS:  Currently none.   SOCIAL HISTORY:  No tobacco, no smoking, no drugs.   FAMILY HISTORY:  Unremarkable.  She reports that her mother may have a  heart problem, but she is unclear what it is.  She has 3 healthy  children, ages 39, 28, and 3.   REVIEW OF SYSTEMS:  Otherwise negative.  Of note, last menstrual period  was March 18, 2007.   PHYSICAL EXAMINATION:  VITAL SIGNS:   Temperature 98.7, pulse 84,  respiratory rate 18, blood pressure 134/76.  Saturation is 98% on room  air.  GENERAL:  She is alert, oriented x3, with no apparent distress.  NECK:  Supple.  No lymphadenopathy.  CARDIOVASCULAR:  Regular, with no murmurs, rubs, or gallops.  LUNGS:  Clear to auscultation with no wheezes, rales, or rhonchi.  SKIN:  No lesions.  ABDOMEN:  Soft, nontender.  Positive bowel sounds.  EXTREMITIES:  No clubbing, cyanosis, or edema.  MUSCULOSKELETAL:  Examination was unremarkable.  NEUROLOGIC:  Examination was nonfocal.   Chest x-ray is pending.  EKG shows normal sinus rhythm with T-wave  inversions in V3.  Currently, laboratory studies are pending.   ASSESSMENT:  This is a 45 year old woman with the history as listed  above, recent deep venous thrombosis, sickle cell trait, who now  presents with chest pain and shortness of breath as well as  palpitations.   Given her history of deep venous thrombosis, we will  admit the patient  with Lovenox therapy, check a CT angiography for possible pulmonary  embolus, and order serial cardiac enzymes, and manage expectantly.      Daniel B. Haithcock, MD  Electronically Signed     DBH/MEDQ  D:  03/01/2007  T:  03/02/2007  Job:  [id number]

## 2010-12-01 NOTE — Assessment & Plan Note (Signed)
[facility]                            CARDIOLOGY OFFICE NOTE   NAME:[name], [name] NOTTE                        MRN:          [id number]  DATE:03/21/2007                            DOB:          1965/08/03    PRIMARY:  Dr. Jonny Ruiz.   REASON FOR PRESENTATION:  Evaluate patient with chest pain.   HISTORY OF PRESENT ILLNESS:  Patient presents for followup of chest  discomfort.  She was hospitalized on March 01, 2007 for this.  She has  a substernal discomfort.  It happens at rest.  It can happen with  activity such as vacuuming.  She describes it as a tightness with  shortness of breath.  It does not radiate to her arms or neck.  It is  moderate in intensity.  There is no associated, nausea, vomiting or  diaphoresis.  It goes away on its own after several minutes.  She was  hospitalized and did not have evidence of myocardial infarction.  Echocardiogram demonstrated no acute abnormalities.  There is trivial  aortic regurgitation.  She has also complained of palpitations, had an  event monitor.  However, this has demonstrated no significant  dysrhythmias.  An outpatient stress perfusion study on August 18 did  demonstrate the EF to be 69%.  There was a question of mild anterior  ischemia.  The patient continue to have the symptoms as described above.  Of note, the patient did have a spiral CT with pulmonary emboli protocol  that was negative for any emboli.   PAST MEDICAL HISTORY:  1. Sickle cell trait.  2. Anxiety.  3. Migraines.  4. Deep venous thrombosis, previously treated with Coumadin.   ALLERGIES:  NONE.   MEDICATIONS:  Aspirin 81 mg daily.   REVIEW OF SYSTEMS:  As stated in the HPI and otherwise negative for  other systems.   PHYSICAL EXAMINATION:  Patient is in no distress.  She is pleasant.  Blood pressure 121/83, heart rate 90 and regular.  HEENT:  Eyelids unremarkable, pupils equally round and reactive to  light, fundi not visualized,  oral mucosa unremarkable.  NECK:  No jugular venous distention, wave form within normal limits,  carotid upstroke brisk and symmetrical, no bruits, no thyromegaly.  LYMPHATICS:  No cervical, axillary, inguinal adenopathy.  LUNGS:  Clear to auscultation bilaterally.  BACK:  No costovertebral angle tenderness.  CHEST:  Unremarkable.  HEART:  PMI not displaced or sustained, S1 and S2 within normal limits,  no S3, no S4, no clicks, no rubs, no murmurs.  ABDOMEN:  Flat, positive bowel sounds normal in frequency and pitch, no  bruits, no rebound, no guarding, no midline pulsatile mass, no  hepatomegaly, splenomegaly.  SKIN:  No rashes, no nodules.  EXTREMITIES:  With 2+ pulses throughout, no edema, no cyanosis, no  clubbing.  NEURO:  Oriented to person, place, and time; cranial nerves II-XII  grossly intact, motor grossly intact throughout.   ASSESSMENT/PLAN:  1. Chest discomfort.  The patient continues to have chest discomfort.      We could not exclude ischemia with the perfusion study and  it is      suggested there could be some anterior ischemia.  Therefore, I      think she needs further testing for this abnormal perfusion study      with ongoing symptoms.  I would suggest a cardiac CT to evaluate      her coronary arteries.  We will go ahead and arrange this.  The      patient will continue with the aspirin.  2. Palpitations.  She will continue to wear the monitor.  Can have it      taken off for the study.  3. Followup.  I will see her after the results of the above.     Rollene Rotunda, MD, [facility]  Electronically Signed    JH/MedQ  DD: 03/21/2007  DT: 03/21/2007  Job #: [id number]   cc:   Dr. Jonny Ruiz

## 2010-12-01 NOTE — Assessment & Plan Note (Signed)
[facility]                 COUMADIN / CHRONIC HEART FAILURE CLINIC NOTE   NAME:[name], [name]                        MRN:          [id number]  DATE:11/29/2006                            DOB:          Apr 01, 1966    TELEPHONE DOCUMENTATION:  [name] is a pleasant patient seen in the anticoagulation clinic  status post deep venous thrombosis of her left lower extremity back in  January of 2008.  The patient has been maintained on Coumadin since that  time.  She has had a period of subtherapeutic INR prior to which and  during which she has had complaints of pain in her involved leg.  She  has had no shortness of breath, no weakness or fatigue.  Last week, when  the patient's INR became what sounded to be subtherapeutic, the patient  was started back on treatments doses of low molecular weight heparin  (enoxaparin 1 mg/kg subcutaneously twice a day).  Her INR subsequently  returned to a value of greater than 2, and this was discontinued on  Monday of this week.  I spoke with Dr. Jonny Ruiz today (Tuesday) to determine  if follow up Doppler was indicated.  After reviewing the case with Dr.  Jonny Ruiz, he did wish to obtain a follow up venous Doppler to rule out  extension or worsening of clot.  Of note, it is important that the  patient has not been wearing her compression stocking on the involved  lower extremity.  I have given instructions to our scheduler to given  [name] a call, and [name] will follow up on that.  I contacted Mrs.  Cohick to let her know of the plan to recheck this test and spent  greater than 10 minutes reviewing the importance of compression stocking  utilization on the left leg due to slowed venous return status post  clot.  The patient agrees that she will try to obtain this stocking.  I  will continue weekly follow up with the patient until her INRs are  stable.      [name], PharmD, BCPS, CPP       Rollene Rotunda, MD,  [facility]    MP/MedQ  DD: 11/30/2006  DT: 11/30/2006  Job #: [id number]   cc:   Corwin Levins, MD

## 2010-12-01 NOTE — Letter (Signed)
August 23, 2008    Corwin Levins, MD  [address]  [place], [place] [address]   RE:  JAMY, WHYTE  MRN:  [id number]  /  DOB:  Dec 03, 1965   Dear Rosanne Ashing,   It was a pleasure seeing Chaunte Hornbeck today at your request because of  palpitations.  She dates these back to when she had H1N1 flu in October,  although her chart describes an admission in August 2008, were  palpitations, this is the number 2 thing on the problem list.  The  patient does not recall that been an issue.   In any case, she is having almost daily episodes of these palpitations  and they come under two potentially distinct labels the first is racing  and the second is flutters.  We discussed at some time the distinction  between two and she thought that the latter might be less hard, but  potentially faster.  In any case, these episodes would occur repeatedly  through the day, each of them was quite brief.  Over the recent months,  they have become increasingly frequent.  They are accompanied by  shortness of breath.  The shortness of breath that she has while  initially described as dyspnea on exertion.  She says it is always only  associated with the palpitations.  She has stopped using caffeine and  chocolate with no impact.  This occasionally waking her on at night.   She has some shower lightheadedness.  She does not have orthostatic  lightheadedness.   Her evaluation of late is included in an echo undertaken at the end of  December which was normal, read by Dr. Algie Coffer.  She had a stress test  in our office in August 2008 under Dr. Jenene Slicker direction and this was  normal.  An echo was done also at that time, it too was normal.   PAST MEDICAL HISTORY:  Notable for:  1. Sickle trait.  2. DVT question idiopathic treated with Coumadin for 6 months.   PAST SURGICAL HISTORY:  Notable for:  T&A and C-section.   REVIEW OF SYSTEMS:  Across multiple organ systems is notable only for  eye glasses, is otherwise  broadly negative.   SOCIAL HISTORY:  She is married.  She has 3 children ranging in age from  61-19.  She does not use cigarettes or recreational drugs.  She uses  alcohol occasionally.  She works in the [employer].  She is currently wearing life watch monitor that she prescribed.   MEDICATIONS:  Metoprolol 25 b.i.d. recently prescribed by you aspirin.   ALLERGIES:  She has no known drug allergies.   PHYSICAL EXAMINATION:  VITAL SIGNS:  Her blood pressure 111/79 with a  pulse of 76.  She was admitted with orthostatic vital signs which were  notable for no significant change in heart rate, her blood pressure over  5 minutes of standing, but she was dizzy throughout.  She says that the  dizziness that was accompanied this was singular and she does not have  orthostatic symptoms for chronic dizziness hardly at all.  HEENT:  No icterus or xanthoma.  NECK:  Veins are flat.  The carotids are brisk and full, bilaterally  without bruits.  Without kyphosis or scoliosis.  LUNGS:  Clear.  HEART:  Sounds are regular without murmurs or gallops.  ABDOMEN:  Soft with active bowel sounds without midline pulsation or  hepatomegaly.  EXTREMITIES:  Femoral pulses are 2+ and  distal pulses were intact.  There is no clubbing, cyanosis, or edema.  NEUROLOGIC:  Grossly normal.  SKIN:  Warm and dry.   Electrocardiogram dated today demonstrated sinus rhythm at 77 with  interval of 0.12/0.9/0.37.  There was T-wave inversions in the  inferolateral leads which was present a year and half ago, although they  are more prominent today.   Review of the event recorder demonstrated PVCs corresponding with her  symptoms.  These PVCs were seen variably, sometimes in patterns of  trigeminy or bigeminy over having as many recorded as 10 beats over 90  seconds.  In addition, there were 0 variable coupling intervals some are  much closer than others.   IMPRESSION:  1. Recurrent palpitations with  distinct syndromes.      a.     Fluttering.      b.     Bracing.  2. Documented premature ventricular contractions with variable      coupling intervals potentially explained the above.  3. History of deep venous thrombosis, previously on Coumadin.  4. Chest pain syndromes with previously negative Myoview scan.  5. Mild hypokalemia.   Jim, five metoprolol therapy initiated for #1 without benefit.   Rosanne Ashing, Ms. Bayley has recurrent symptoms of palpitations which likely  represents PVCs, which likely are explained by the PVCs which are  represented in the recording.  The two syndromes may be related to the  variable coupling intervals.   I assured her that the context of a normal heart.  The prognostic  implications of PVCs are scarcely measurable.  It is unlikely that this  represents any harbinger of any other disease given the extensive  cardiac evaluation she has had here up to date.   The beta-blockers prescribed have not been particularly beneficial.  She  was largely reassured by the explanation.  Please the transition  transcription school back up and had the problem list.   This labs were notable for potassium of 3.4.  I wonder whether this may  not be contributing.  I have asked that she go ahead and had been given  over-the-counter magnesium supplement.  I gave her a prescription for  potassium 10 mEq to take every other day.  I have also asked her  followup with you about 2 months' time, however, this can be repeated.   If there is anything further, we can do to help, please do not hesitate  to contact me.  We have not scheduled a cardiac followup.    Sincerely,      Duke Salvia, MD, [facility]  Electronically Signed    SCK/MedQ  DD: 08/23/2008  DT: 08/24/2008  Job #: [id number]

## 2010-12-01 NOTE — Discharge Summary (Signed)
[name] NO.:  [id number]   MEDICAL RECORD NO.:  [id number]          PATIENT TYPE:  INP   LOCATION:  3734                         FACILITY:  [facility]   PHYSICIAN:  Jonelle Sidle, MD DATE OF BIRTH:  09-30-1965   DATE OF ADMISSION:  03/01/2007  DATE OF DISCHARGE:  03/03/2007                               DISCHARGE SUMMARY   PRIMARY CARDIOLOGIST:  Rollene Rotunda, MD, [facility]   PRIMARY CARE [facility]ry[facility] Veasey:  Corwin Levins, MD   DISCHARGE DIAGNOSES:  Chest pain.   SECONDARY DIAGNOSES:  1. History of palpitations.  2. History of sickle cell trait.  3. History of DVT and previous Coumadin therapy for 6 months,      discontinued in July of 2008.  4. Anxiety.  5. History of migraine headache.   ALLERGIES:  NO KNOWN DRUG ALLERGIES.   PROCEDURES:  CT of the chest, 2D echocardiogram.   HISTORY OF PRESENT ILLNESS:  A 45 year old African-American female with  the above problem list, who was in her usual state of health up until  approximately 1 month ago, when she began to experience intermittent  heart fluttering.  On the day of admission, March 01, 2007, she  developed sharp left-sided chest discomfort while sitting at a computer  at work and that it was somewhat worse with palpation as well as with  deep breathing.  There was no associated dyspnea, diaphoresis, nausea,  or vomiting.  Because of prolonged periods of discomfort persisting for  8 hours, she presented to the [facility] ED for evaluation.  In the ED,  cardiac markers were negative, and ECG showed nonspecific T-wave  abnormality.  She was admitted for further evaluation.   HOSPITAL COURSE:  The patient underwent CT of the chest, August 14,  which showed no pulmonary embolism or other acute process.  She ruled  out for MI by cardiac markers x3.  Her ECG has had no acute changes  since admission.  She underwent 2D echocardiogram on August 14, which  revealed normal LV function with an EF of 60-65%  without regional wall  motion abnormalities and trivial aortic insufficiency.  She has  continued to have intermittent 2 to 3/10 chest tightness despite all of  these findings and is otherwise comfortable this morning.  We have  arranged for her to be discharged today with plans for an outpatient  exercise Myoview on Monday, August 18, at 12 noon.  She will also obtain  an event recorder at that time, given her history of palpitations.   DISCHARGE LABORATORY:  Hemoglobin 11.7, hematocrit 35.7, WBC 6.5,  platelets 274, MCV of 76.5, neutrophils 58.  Sodium 141, potassium 4.1,  chloride 107, CO2 of 25, BUN 6, creatinine 0.87, glucose 76.  PT 13.1,  INR 1.0, PTT 27, total bilirubin 0.5, alkaline phosphatase 46, AST 18,  ALT 13, albumin 3.6.  CK 95, MB 0.9, troponin I 0.01.  Total cholesterol  131, triglycerides 36, HDL 54, LDL 70, calcium 9.3, magnesium 1.9.  BNP  less than 30, D-dimer less than 0.22.   DISPOSITION:  The patient  is being discharged home today in good  condition.   FOLLOWUP PLANS AND APPOINTMENTS:  She will undergo an exercise Myoview  stress testing on August 18 at 12 noon.  She will also pick up an event  recorder on that day.  She will follow up with Dr. Fayrene [facility] on September 2, at 10:15 am.   DISCHARGE MEDICATIONS:  1. Aspirin 81 mg daily.  2. Prilosec OTC 20 mg daily.   OUTSTANDING LABORATORY STUDIES:  None.   DURATION OF DISCHARGE ENCOUNTER:  40 minutes including physician time.      [name], ANP      Jonelle Sidle, MD  Electronically Signed    CB/MEDQ  D:  03/03/2007  T:  03/03/2007  Job:  [id number]   cc:   Corwin Levins, MD

## 2010-12-04 NOTE — Discharge Summary (Signed)
[name], [name]                 ACCOUNT NO.:  [id number]   MEDICAL RECORD NO.:  [id number]          PATIENT TYPE:  INP   LOCATION:  5731                         FACILITY:  [facility]   PHYSICIAN:  Gordy Savers, MDDATE OF BIRTH:  [birth year]   DATE OF ADMISSION:  08/11/2006  DATE OF DISCHARGE:  08/13/2006                               DISCHARGE SUMMARY   FINAL DIAGNOSIS:  Left lower extremity deep vein thrombosis.   HISTORY OF PRESENT ILLNESS:  The patient is a 45 year old black female  recently treated with birth control pills who presented with one day  history of pain and swelling involving her left lower extremity.  Outpatient venous Doppler examination confirmed a popliteal DVT. She was  admitted for further evaluation and treatment.   The patient was admitted to the hospital where she was placed on  subcutaneous Lovenox as well as Coumadin.   LABORATORY STUDIES:  Fairly unremarkable. White count was 4.7, H&H 13.5  and 41.3. Serial protimes monitored here in the hospital. D-dimer  surprisingly was normal at 0.28. __________  level was normal. Protein C  level 143. __________ 129. Lupus anticoagulant profile was negative.   HOSPITAL COURSE:  Hospital course was uneventful. She was instructed on  self-injection of Lovenox, and she was quite comfortable doing this. At  time of discharge she was asymptomatic. She had some minimal discomfort  in the left calf only. A CT angio of the chest was negative for  pulmonary embolism.   DISPOSITION:  The patient was discharged today to complete seven days of  Lovenox 80 mg every 12 hours. She has also been discharged on Coumadin  10 mg daily. She has been instructed to return to the care of her  primary care [name] in 48 hours, and another protime will be monitored  at that time. INR at time of discharge was 1.2. Condition on discharge  stable.      Gordy Savers, MD  Electronically Signed     PFK/MEDQ  D:   08/13/2006  T:  08/13/2006  Job:  [id number]

## 2010-12-04 NOTE — Discharge Summary (Signed)
   NAME:  [name], [name]                           ACCOUNT NO.:  [id number]   MEDICAL RECORD NO.:  [id number]                   PATIENT TYPE:  INP   LOCATION:  9121                                 [facility]   PHYSICIAN:  Ivor Costa. Farrel Gobble, M.D.              DATE OF BIRTH:  1966-06-20   DATE OF ADMISSION:  04/16/2003  DATE OF DISCHARGE:  04/20/2003                                 DISCHARGE SUMMARY   DISCHARGE DIAGNOSES:  1. Intrauterine pregnancy at term at 40-1/[redacted] weeks gestation.  2. Oligohydramnios.  3. Cephalopelvic disproportion.   PROCEDURE:  Primary cesarean section with the delivery of a viable infant.   HISTORY OF PRESENT ILLNESS:  A 45 year old gravida 3, para 2 with an LMP of  July 05, 2002.  EDC was April 12, 2003.  Currently 40 and 4/[redacted] weeks  gestation.  She had been seen in the office for routine visit due to changes  in fundal height.  Estimated fetal weight was 55th percentile for 40-1/2  weeks but amniotic fluid index was 7.2 placing her less than 5th percent.  Nitrazine/ferning was negative.  Her last AFI was 65th percentile with an  AFI of 17.1 cm.  She declined amniocentesis due to her age during her  prenatal course.  No other complications were noted.   LABORATORIES:  Blood type B+.  Antibody negative.  Serology nonreactive.  Rubella titer immune.  Hepatitis negative.  HIV nonreactive.  Group B Strep  negative.   HOSPITAL COURSE:  She presented on April 16, 2003 for an induction due  to the oligohydramnios.  She progressed to about 5-6 and remained there for  hours.  Plan was made with patient and husband.  They proceeded with a  primary cesarean section due to the oligohydramnios, CPD, failure to  progress.  She did deliver a viable female with Apgars of 7 and 9 with a  birth weight of 7 pounds 10 ounces.  A true knot was noted in the cord.  Postpartum she remained afebrile, voiding, lochia decreasing.  She did well.  She was discharged home on  her postoperative day three.   DISCHARGE LABORATORIES:  White count 11.8, hemoglobin 9.2, hematocrit 22.6,  platelets 187,000.   DISPOSITION:  She was discharged to home with instructions to follow up in  six weeks or as needed.  Continue prenatal vitamins and iron.  A  prescription for Lortab was given.  GGA discharge booklet.  Staples were  removed and Steri-Strips applied.     Davonna Belling. Young, N.P.                      Ivor Costa. Farrel Gobble, M.D.    [facility]  D:  05/09/2003  T:  05/09/2003  Job:  [id number]

## 2010-12-04 NOTE — H&P (Signed)
[name], RAUSCH                 ACCOUNT NO.:  [id number]   MEDICAL RECORD NO.:  [id number]          PATIENT TYPE:  INP   LOCATION:  5731                         FACILITY:  [facility]   PHYSICIAN:  Corwin Levins, MD      DATE OF BIRTH:  August 09, 1965   DATE OF ADMISSION:  08/11/2006  DATE OF DISCHARGE:                              HISTORY & PHYSICAL   CHIEF COMPLAINT:  Recently found left lower extremity DVT below the  knee.   HISTORY OF PRESENT ILLNESS:  Ms. Older is a 45 year old black female  who has been off her birth control pills for four weeks, but has  previously used them for ten years.  Yesterday, she had acute onset of  pain and swelling of the left lower extremity below the knees, she went  to the urgent care and was referred to [facility] where she had  venous Doppler ultrasound positive for DVT below the popliteal fossa.  She had also some chest pain and shortness of breath yesterday.  She has  had a long history of musculoskeletal pains and anxiety previously.   PAST MEDICAL HISTORY:  1. Anxiety.  2. Migraine.  3. Sickle cell trait.   SOCIAL HISTORY:  No tobacco, no alcohol.   FAMILY HISTORY:  No DVT.   REVIEW OF SYSTEMS:  Otherwise negative   PHYSICAL EXAMINATION:  GENERAL:  A 45 year old black female, anxious.  VITAL SIGNS:  Blood pressure 102/77, heart rate 76, temperature 98.1,  weight 177.  ENT EXAM:  Sclerae anicteric.  TMs clear.  Pharynx benign.  NECK:  Without lymphadenopathy, JVD, thyromegaly.  CHEST:  No rales or wheezing.  CARDIAC EXAM:  Regular rate and rhythm.  ABDOMEN:  Soft, nontender, positive bowel sounds.  No organomegaly, no  masses.  EXTREMITIES:  The left calf below the knee with 2+ swelling, tenderness  and warmth, but little erythema.  There is positive Homans sign.   LABS:  Left lower extremity venous Doppler January 23 with a DVT below  the popliteal fossa.   ASSESSMENT/PLAN:  1. DVT left lower extremity below the knee.  2.  Chest pain.  3. Shortness of breath.  4. Questionable pulmonary embolus.   She is to be admitted, start Lovenox and Coumadin and check chest CT to  rule out pulmonary embolus.  She will need to learn Lovenox shots and  possibly home in one to two days if stable.      Corwin Levins, MD  Electronically Signed     JWJ/MEDQ  D:  08/11/2006  T:  08/11/2006  Job:  [id number]

## 2010-12-04 NOTE — Op Note (Signed)
NAME:  [name], [name]                           ACCOUNT NO.:  [id number]   MEDICAL RECORD NO.:  [id number]                   PATIENT TYPE:  INP   LOCATION:  9167                                 [facility]ACILITY[facility]   PHYSICIAN:  Ivor Costa. Farrel Gobble, M.D.              DATE OF BIRTH:  [birth year]   DATE OF PROCEDURE:  04/17/2003  DATE OF DISCHARGE:                                 OPERATIVE REPORT   PREOPERATIVE DIAGNOSES:  1. Cephalopelvic disproportion.  2. Arrest of dilation.  3. Oligohydramnios.   POSTOPERATIVE DIAGNOSES:  1. Cephalopelvic disproportion.  2. Arrest of dilation.  3. Oligohydramnios.   PROCEDURE:  Primary cesarean section, low flap transverse.   SURGEON:  Ivor Costa. Farrel Gobble, M.D.   ANESTHESIA:  spinal.   FLUIDS REPLACED:  4 L lactated Ringer's.   ESTIMATED BLOOD LOSS:  600 mL.   URINE OUTPUT:  100 mL.   FINDINGS:  A viable female infant in the vertex presentation.  A true knot  was noted in the cord.  Apgars were 7 and 9, birth weight 7 pounds 10  ounces.  Normal uterus, tubes, and ovaries.   COMPLICATIONS:  None.   PATHOLOGY:  Placenta.   DESCRIPTION OF PROCEDURE:  The patient was taken to the operating room and  placed in a supine position with left lateral displacement, prepped and  draped in the usual sterile fashion.  After adequate anesthesia was ensured,  a  Pfannenstiel skin incision was made with a scalpel, carried through the  underlying layer of fascia with electrocautery.  The fascia was scored in  the midline and the incision was extended laterally with the Mayo scissors  and the inferior aspect of the fascial incision was grasped with Kochers.  The underlying rectus muscles were dissected off with blunt and sharp  dissection.  In a similar fashion, the superior aspect of the incision was  grasped with Kochers.  The underlying rectus muscles were separated sharply.  The rectus muscles were identified.  The peritoneum was identified and  entered  bluntly.  The peritoneal incision was then extended superiorly and  inferiorly with good visualization of the underlying bowel and bladder.  The  orientation view was confirmed, the bladder blade was inserted.  The  vesicouterine peritoneum was identified, tented up sharply with the  Metzenbaums.  The bladder flap was created digitally.  The bladder blade was  then reinserted and the lower uterine segment was incised in a transverse  fashion with a scalpel.  The infant was delivered from the vertex  presentation, the cord was cut and clamped, and handed off to the awaiting  pediatricians.  The pH and cord bloods were obtained.  The placenta  separated naturally.  The uterus was then cleared of all clots and debris.  The uterine incision was repaired with a running locked layer of 0 chromic  and noted to be hemostatic.  The  pelvis was then irrigated with copious  amounts of warm saline.  The adnexa were inspected and noted to be  unremarkable.  Reinspection of the incision and adnexa assured [place] of  hemostasis.  The peritoneum, fascia, and muscles were also inspected and  treated accordingly.  The fascia was then closed with a running layer of 0  Vicryl.  The subcu was then irrigated.  The skin was closed with staples.  The patient tolerated the procedure well.  Sponge, lap, and needle counts  correct x2.  She was transferred to the PACU in stable condition.  The  patient received Ancef at cord clamp.                                               Ivor Costa. Farrel Gobble, M.D.    THL/MEDQ  D:  04/17/2003  T:  04/18/2003  Job:  [id number]
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
## 2010-12-15 ENCOUNTER — Emergency Department (HOSPITAL_COMMUNITY)
Admission: EM | Admit: 2010-12-15 | Discharge: 2010-12-15 | Disposition: A | Discharge status: Home / Self Care | Payer: 59 | Attending: Emergency Medicine | Admitting: Emergency Medicine

## 2010-12-15 ENCOUNTER — Telehealth: Payer: Self-pay | Admitting: *Deleted

## 2010-12-15 ENCOUNTER — Emergency Department (HOSPITAL_COMMUNITY): Payer: 59

## 2010-12-15 DIAGNOSIS — Z86718 Personal history of other venous thrombosis and embolism: Secondary | ICD-10-CM | POA: Insufficient documentation

## 2010-12-15 DIAGNOSIS — R079 Chest pain, unspecified: Secondary | ICD-10-CM | POA: Insufficient documentation

## 2010-12-15 LAB — COMPREHENSIVE METABOLIC PANEL
ALT: 11 U/L (ref 0–35)
AST: 16 U/L (ref 0–37)
Alkaline Phosphatase: 39 U/L (ref 39–117)
CO2: 26 mEq/L (ref 19–32)
Calcium: 9 mg/dL (ref 8.4–10.5)
Chloride: 105 mEq/L (ref 96–112)
GFR calc Af Amer: >60 mL/min (ref >60)
GFR calc non Af Amer: >60 mL/min (ref >60)
Glucose, Bld: 77 mg/dL (ref 70–99)
Potassium: 3.6 mEq/L (ref 3.5–5.1)
Sodium: 141 mEq/L (ref 135–145)
Total Bilirubin: 0.2 mg/dL — ABNORMAL LOW (ref 0.3–1.2)

## 2010-12-15 LAB — CBC
HCT: 36 % (ref 36.0–46.0)
MCHC: 33.1 g/dL (ref 30.0–36.0)
MCV: 74.8 fL — ABNORMAL LOW (ref 78.0–100.0)
RDW: 15.4 % (ref 11.5–15.5)

## 2010-12-15 LAB — DIFFERENTIAL
Eosinophils Absolute: 0.1 10*3/uL (ref 0.0–0.7)
Eosinophils Relative: 2 % (ref 0–5)
Lymphocytes Relative: 38 % (ref 12–46)
Lymphs Abs: 1.7 10*3/uL (ref 0.7–4.0)
Monocytes Absolute: 0.3 10*3/uL (ref 0.1–1.0)

## 2010-12-15 NOTE — Telephone Encounter (Signed)
Pt c/o sharp CP early Monday am. Since then she has been having sweats, h/a, slight SOB and left CP off and on. Advised ER now and to f/u with Dr Jonny Ruiz after, pt agreed.

## 2011-01-12 DIAGNOSIS — F411 Generalized anxiety disorder: Secondary | ICD-10-CM

## 2011-01-13 ENCOUNTER — Encounter: Payer: Self-pay | Admitting: Internal Medicine

## 2011-01-13 ENCOUNTER — Other Ambulatory Visit: Payer: 59

## 2011-01-13 ENCOUNTER — Ambulatory Visit (INDEPENDENT_AMBULATORY_CARE_PROVIDER_SITE_OTHER): Payer: 59 | Admitting: Internal Medicine

## 2011-01-13 VITALS — BP 120/84 | HR 76 | Temp 98.1°F | Ht 65.0 in | Wt 151.5 lb

## 2011-01-13 DIAGNOSIS — F411 Generalized anxiety disorder: Secondary | ICD-10-CM

## 2011-01-13 DIAGNOSIS — R519 Headache, unspecified: Secondary | ICD-10-CM | POA: Insufficient documentation

## 2011-01-13 DIAGNOSIS — Z0001 Encounter for general adult medical examination with abnormal findings: Secondary | ICD-10-CM | POA: Insufficient documentation

## 2011-01-13 DIAGNOSIS — I8390 Asymptomatic varicose veins of unspecified lower extremity: Secondary | ICD-10-CM

## 2011-01-13 DIAGNOSIS — Z0000 Encounter for general adult medical examination without abnormal findings: Secondary | ICD-10-CM | POA: Insufficient documentation

## 2011-01-13 DIAGNOSIS — R51 Headache: Secondary | ICD-10-CM

## 2011-01-13 MED ORDER — LEVOFLOXACIN 500 MG PO TABS: 500.0000 mg | ORAL_TABLET | Freq: Every day | ORAL | Status: AC

## 2011-01-13 NOTE — Assessment & Plan Note (Signed)
Has chronic LLE post phebitic swelling (mild) but symptomatic post left knee varicosity as well - will refer vein clinic

## 2011-01-13 NOTE — Assessment & Plan Note (Signed)
Unclear etiology but diff would include sinusitis or trigeminal neuralgia vs other;  Is currently afeb but with pain and swelling will tx for sinusits first, check ct sinus , but consider neurology or ENT if not improved in next few days

## 2011-01-13 NOTE — Progress Notes (Signed)
Subjective:    Patient ID: [name], female    DOB: 1965-12-21, 45 y.o.   MRN: [id number]  HPI  Here with c/o 1 wk onset left facial pain, burgning type with ? Mild swelling, seeme to start in front of the left ear, and also left maxillary area,  No nasal symtpoms, and breathes through nostril ok;   Pt denies fever, wt loss, night sweats, loss of appetite, or other constitutional symptoms  No HA, ST, cough, or right facial pain.  Not worse to talk, eat.  No blurred vision and no mouth pain.  No trauma.  Also with ongoing mild to mod LLE varicositie getting larger with pain to the part post to the left knee, but no cord or new inflammation.  Pt denies chest pain, increased sob or doe, wheezing, orthopnea, PND, increased LE swelling, palpitations, dizziness or syncope.  Pt denies new neurological symptoms such as new headache, or facial or extremity weakness or numbness   Pt denies polydipsia, polyuria.  Also S/p left ovary cyst and BTL done June 14 - doing ok.  Denies worsening depressive symptoms, suicidal ideation, or panic, though has ongoing anxiety, not increased recently per pt Past Medical History  Diagnosis Date  . ACUTE NASOPHARYNGITIS 05/07/2010  . BACK PAIN 02/05/2010  . CELLULITIS 01/13/2010  . CHEST PAIN UNSPECIFIED 08/23/2008  . CONSTIPATION, CHRONIC 04/09/2007  . DEEP VENOUS THROMBOPHLEBITIS, LEFT, LEG, HX OF 04/09/2007  . 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 DIZZINESS 08/15/2008  . DYSPNEA 05/20/2008  . FATIGUE 05/20/2008  . KNEE PAIN, LEFT 09/11/2007  . MIGRAINE HEADACHE 04/09/2007  . Palpitations 05/20/2008  . PREMATURE VENTRICULAR CONTRACTIONS 08/23/2008  . RASH-NONVESICULAR 01/17/2008  . RUQ PAIN 02/23/2010  . SICKLE CELL TRAIT 04/09/2007  . Tension headache 04/09/2007  . TONSILLITIS, CHRONIC 03/13/2008  . VARICOSE VEINS LOWER EXTREMITIES W/OTH COMPS 09/11/2007   Past Surgical History  Procedure Date  . Tonsillectomy   . Cesarean section     reports that she has  never smoked. She does not have any smokeless tobacco history on file. She reports that she drinks alcohol. She reports that she does not use illicit drugs. family history includes Cancer in her other; Heart disease in her mother; and Hypertension in her mother. No Known Allergies Current Outpatient Prescriptions on File Prior to Visit  Medication Sig Dispense Refill  . aspirin 81 MG EC tablet Take 81 mg by mouth daily.        Marland Kitchen doxycycline (VIBRAMYCIN) 100 MG capsule Take 100 mg by mouth 2 (two) times daily.        Marland Kitchen HYDROcodone-acetaminophen (NORCO) 5-325 MG per tablet Take 1 tablet by mouth every 6 (six) hours as needed.        . mupirocin (BACTROBAN) 2 % ointment Apply topically 2 (two) times daily.        . naproxen (NAPROSYN) 500 MG tablet Take 500 mg by mouth 2 (two) times daily as needed.         Review of Systems Review of Systems  Constitutional: Negative for diaphoresis and unexpected weight change.  HENT: Negative for drooling and tinnitus.   Eyes: Negative for photophobia and visual disturbance.  Respiratory: Negative for choking and stridor.   .       Objective:   Physical Exam BP 120/84  Pulse 76  Temp(Src) 98.1 F (36.7 C) (Oral)  Ht 5\' 5"  (1.651 m)  Wt 151 lb 8 oz (68.72 kg)  BMI 25.21 kg/m2  SpO2 97%  LMP 01/05/2011 Physical Exam  VS noted Constitutional: Pt appears well-developed and well-nourished.  HENT: Head: Normocephalic.  Right Ear: External ear normal.  Left Ear: External ear normal.  Bilat tm's normal in appearance, sinus nontender, ? Mild swelling/tender left max sinus area, pharynx benign Eyes: Conjunctivae and EOM are normal. Pupils are equal, round, and reactive to light.  Neck: Normal range of motion. Neck supple.  Cardiovascular: Normal rate and regular rhythm.   Pulmonary/Chest: Effort normal and breath sounds normal.  Neurological: Pt is alert. No cranial nerve deficit. motor/sens/dtr intact Skin: Skin is warm. No erythema. LLE varicosity  noted, with some tender post left knee, but no cord, neg homans, no LE edema or redness Psychiatric: Pt behavior is normal. Thought content normal. 1+ nervous        Assessment & Plan:

## 2011-01-13 NOTE — Patient Instructions (Addendum)
Take all new medications as prescribed Continue all other medications as before You will be contacted regarding the referral for: CT sinus (please see PCC's to see if this can be done today) You will be contacted regarding the referral for: vein clinic for the left leg varicose vein Please return in 6 mo with Lab testing done 3-5 days before

## 2011-01-14 ENCOUNTER — Telehealth: Payer: Self-pay

## 2011-01-14 NOTE — Telephone Encounter (Signed)
Message copied by Pincus Sanes on Thu Jan 14, 2011 11:52 AM ------      Message from: Corwin Levins      Created: Wed Jan 13, 2011  5:08 PM      Regarding: RE: prescription for pain       I think already has naproxen and norco on her med list;  I would avoid more pain med than this      ----- Message -----         From: Scharlene Gloss         Sent: 01/13/2011  11:32 AM           To: Oliver Barre, MD      Subject: prescription for pain                                    Patient would like something for the pain sent to her pharmacy.

## 2011-01-14 NOTE — Telephone Encounter (Signed)
Called informed patient of MD's instructions.

## 2011-01-20 ENCOUNTER — Encounter: Payer: Self-pay | Admitting: Internal Medicine

## 2011-01-20 NOTE — Assessment & Plan Note (Signed)
stable overall by hx and exam, most recent data reviewed with pt, and pt to continue medical treatment as before  Lab Results  Component Value Date   WBC 4.6 12/15/2010   HGB 11.9* 12/15/2010   HCT 36.0 12/15/2010   PLT 215 12/15/2010   CHOL 149 02/05/2010   TRIG 47.0 02/05/2010   HDL 64.60 02/05/2010   ALT 11 12/15/2010   AST 16 12/15/2010   NA 141 12/15/2010   K 3.6 12/15/2010   CL 105 12/15/2010   CREATININE 0.85 12/15/2010   BUN 10 12/15/2010   CO2 26 12/15/2010   TSH 0.76 02/05/2010   INR 2.2 RATIO* 11/28/2006

## 2011-04-13 ENCOUNTER — Other Ambulatory Visit (INDEPENDENT_AMBULATORY_CARE_PROVIDER_SITE_OTHER): Payer: 59

## 2011-04-13 DIAGNOSIS — Z0000 Encounter for general adult medical examination without abnormal findings: Secondary | ICD-10-CM

## 2011-04-13 LAB — CBC WITH DIFFERENTIAL/PLATELET
Eosinophils Relative: 1.6 % (ref 0.0–5.0)
HCT: 35.8 % — ABNORMAL LOW (ref 36.0–46.0)
Lymphs Abs: 1.2 10*3/uL (ref 0.7–4.0)
Monocytes Relative: 8.1 % (ref 3.0–12.0)
Neutrophils Relative %: 63.9 % (ref 43.0–77.0)
Platelets: 282 10*3/uL (ref 150.0–400.0)
RBC: 4.7 Mil/uL (ref 3.87–5.11)
WBC: 4.6 10*3/uL (ref 4.5–10.5)

## 2011-04-13 LAB — HEPATIC FUNCTION PANEL
ALT: 16 U/L (ref 0–35)
AST: 22 U/L (ref 0–37)
Bilirubin, Direct: 0.1 mg/dL (ref 0.0–0.3)
Total Bilirubin: 0.6 mg/dL (ref 0.3–1.2)

## 2011-04-13 LAB — BASIC METABOLIC PANEL
BUN: 8 mg/dL (ref 6–23)
Calcium: 9.1 mg/dL (ref 8.4–10.5)
Creatinine, Ser: 0.9 mg/dL (ref 0.4–1.2)
GFR: 86.58 mL/min (ref >60.00)
Glucose, Bld: 87 mg/dL (ref 70–99)

## 2011-04-13 LAB — URINALYSIS, ROUTINE W REFLEX MICROSCOPIC
Bilirubin Urine: NEGATIVE
Ketones, ur: NEGATIVE
Specific Gravity, Urine: 1.015 (ref 1.000–1.030)
Urine Glucose: NEGATIVE
Urobilinogen, UA: 0.2 (ref 0.0–1.0)

## 2011-04-13 LAB — TSH: TSH: 0.91 u[IU]/mL (ref 0.35–5.50)

## 2011-04-19 ENCOUNTER — Encounter: Payer: Self-pay | Admitting: Internal Medicine

## 2011-04-19 ENCOUNTER — Ambulatory Visit (INDEPENDENT_AMBULATORY_CARE_PROVIDER_SITE_OTHER): Payer: 59 | Admitting: Internal Medicine

## 2011-04-19 VITALS — BP 102/70 | HR 76 | Temp 98.1°F | Ht 65.0 in | Wt 149.4 lb

## 2011-04-19 DIAGNOSIS — M5432 Sciatica, left side: Secondary | ICD-10-CM

## 2011-04-19 DIAGNOSIS — Z0000 Encounter for general adult medical examination without abnormal findings: Secondary | ICD-10-CM

## 2011-04-19 DIAGNOSIS — J309 Allergic rhinitis, unspecified: Secondary | ICD-10-CM

## 2011-04-19 DIAGNOSIS — Z23 Encounter for immunization: Secondary | ICD-10-CM

## 2011-04-19 HISTORY — DX: Sciatica, left side: M54.32

## 2011-04-19 HISTORY — DX: Allergic rhinitis, unspecified: J30.9

## 2011-04-19 MED ORDER — FLUTICASONE PROPIONATE 50 MCG/ACT NA SUSP: 2.0000 | Freq: Every day | NASAL | Status: DC

## 2011-04-19 MED ORDER — FEXOFENADINE HCL 180 MG PO TABS: 180.0000 mg | ORAL_TABLET | Freq: Every day | ORAL | Status: DC

## 2011-04-19 NOTE — Assessment & Plan Note (Signed)
Mild to mod, for allegra/flonase course,  to f/u any worsening symptoms or concerns  

## 2011-04-19 NOTE — Assessment & Plan Note (Signed)

## 2011-04-19 NOTE — Progress Notes (Signed)
Subjective:    Patient ID: [name], female    DOB: Jun 16, 1966, 45 y.o.   MRN: [id number]  HPI Here for wellness and f/u;  Overall doing ok;  Pt denies CP, worsening SOB, DOE, wheezing, orthopnea, PND, worsening LE edema, palpitations, dizziness or syncope.  Pt denies neurological change such as new Headache, facial or extremity weakness.  Pt denies polydipsia, polyuria, or low sugar symptoms. Pt states overall good compliance with treatment and medications, good tolerability, and trying to follow lower cholesterol diet.  Pt denies worsening depressive symptoms, suicidal ideation or panic. No fever, wt loss, night sweats, loss of appetite, or other constitutional symptoms.  Pt states good ability with ADL's, low fall risk, home safety reviewed and adequate, no significant changes in hearing or vision, and occasionally active with exercise. Does have several wks ongoing nasal allergy symptoms with clear congestion, itch and sneeze, without fever, pain, ST, cough or wheezing.  Pt continues to have recurring mild left LBP without change in severity, bowel or bladder change, fever, wt loss,  worsening LE pain/numbness/weakness, gait change or falls though has some occasional LE flashes of pain/sciatica like.  No change in LLE chronic edema s/p hx of dvt Past Medical History  Diagnosis Date  . ACUTE NASOPHARYNGITIS 05/07/2010  . BACK PAIN 02/05/2010  . CELLULITIS 01/13/2010  . CHEST PAIN UNSPECIFIED 08/23/2008  . CONSTIPATION, CHRONIC 04/09/2007  . DEEP VENOUS THROMBOPHLEBITIS, LEFT, LEG, HX OF 04/09/2007  . 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 DIZZINESS 08/15/2008  . DYSPNEA 05/20/2008  . FATIGUE 05/20/2008  . KNEE PAIN, LEFT 09/11/2007  . MIGRAINE HEADACHE 04/09/2007  . Palpitations 05/20/2008  . PREMATURE VENTRICULAR CONTRACTIONS 08/23/2008  . RASH-NONVESICULAR 01/17/2008  . RUQ PAIN 02/23/2010  . SICKLE CELL TRAIT 04/09/2007  . Tension headache 04/09/2007  . TONSILLITIS, CHRONIC 03/13/2008    . VARICOSE VEINS LOWER EXTREMITIES W/OTH COMPS 09/11/2007   Past Surgical History  Procedure Date  . Tonsillectomy   . Cesarean section     reports that she has never smoked. She does not have any smokeless tobacco history on file. She reports that she drinks alcohol. She reports that she does not use illicit drugs. family history includes Cancer in her other; Heart disease in her mother; and Hypertension in her mother. No Known Allergies Current Outpatient Prescriptions on File Prior to Visit  Medication Sig Dispense Refill  . aspirin 81 MG EC tablet Take 81 mg by mouth daily.        Marland Kitchen doxycycline (VIBRAMYCIN) 100 MG capsule Take 100 mg by mouth 2 (two) times daily.        Marland Kitchen HYDROcodone-acetaminophen (NORCO) 5-325 MG per tablet Take 1 tablet by mouth every 6 (six) hours as needed.        . mupirocin (BACTROBAN) 2 % ointment Apply topically 2 (two) times daily.        . naproxen (NAPROSYN) 500 MG tablet Take 500 mg by mouth 2 (two) times daily as needed.         Review of Systems Review of Systems  Constitutional: Negative for diaphoresis, activity change, appetite change and unexpected weight change.  HENT: Negative for hearing loss, ear pain, facial swelling, mouth sores and neck stiffness.   Eyes: Negative for pain, redness and visual disturbance.  Respiratory: Negative for shortness of breath and wheezing.   Cardiovascular: Negative for chest pain and palpitations.  Gastrointestinal: Negative for diarrhea, blood in stool, abdominal distention and rectal pain.  Genitourinary: Negative for  hematuria, flank pain and decreased urine volume.  Musculoskeletal: Negative for myalgias and joint swelling.  Skin: Negative for color change and wound.  Neurological: Negative for syncope and numbness.  Hematological: Negative for adenopathy.  Psychiatric/Behavioral: Negative for hallucinations, self-injury, decreased concentration and agitation.      Objective:   Physical Exam BP 102/70   Pulse 76  Temp(Src) 98.1 F (36.7 C) (Oral)  Ht 5\' 5"  (1.651 m)  Wt 149 lb 6 oz (67.756 kg)  BMI 24.86 kg/m2  SpO2 99%  LMP 03/23/2011 Physical Exam  VS noted Constitutional: Pt is oriented to person, place, and time. Appears well-developed and well-nourished.  HENT:  Head: Normocephalic and atraumatic.  Right Ear: External ear normal.  Left Ear: External ear normal.  Nose: Nose normal.  Mouth/Throat: Oropharynx is clear and moist.  Bilat tm's mild erythema.  Sinus nontender.  Pharynx mild erythema Eyes: Conjunctivae and EOM are normal. Pupils are equal, round, and reactive to light.  Neck: Normal range of motion. Neck supple. No JVD present. No tracheal deviation present.  Cardiovascular: Normal rate, regular rhythm, normal heart sounds and intact distal pulses.   Pulmonary/Chest: Effort normal and breath sounds normal.  Abdominal: Soft. Bowel sounds are normal. There is no tenderness.  Musculoskeletal: Normal range of motion. Exhibits no edema.  Lymphadenopathy:  Has no cervical adenopathy.  Neurological: Pt is alert and oriented to person, place, and time. Pt has normal reflexes. No cranial nerve deficit.  Skin: Skin is warm and dry. No rash noted. trace to 1+ LLE edema - chronic/venous Psychiatric:  Has  normal mood and affect. Behavior is normal.         Assessment & Plan:

## 2011-04-19 NOTE — Patient Instructions (Addendum)
You had the flu shot today Take all new medications as prescribed Continue all other medications as before Please remember to followup with your GYN for the yearly pap smear and/or mammogram\ Please return in 1 year for your yearly visit, or sooner if needed, with Lab testing done 3-5 days before

## 2011-04-20 LAB — BASIC METABOLIC PANEL
CO2: 28
Calcium: 9.4
Chloride: 103
GFR calc Af Amer: >60
Sodium: 140

## 2011-04-30 LAB — CARDIAC PANEL(CRET KIN+CKTOT+MB+TROPI)
CK, MB: 0.9
Relative Index: INVALID
Troponin I: 0.01

## 2011-05-03 LAB — COMPREHENSIVE METABOLIC PANEL
ALT: 13
AST: 18
Albumin: 3.6
CO2: 25
Calcium: 9.3
GFR calc Af Amer: >60
GFR calc non Af Amer: >60
Sodium: 141
Total Protein: 7

## 2011-05-03 LAB — BASIC METABOLIC PANEL
GFR calc non Af Amer: >60
Glucose, Bld: 95
Potassium: 3.7
Sodium: 138

## 2011-05-03 LAB — POCT PREGNANCY, URINE: Preg Test, Ur: NEGATIVE

## 2011-05-03 LAB — B-NATRIURETIC PEPTIDE (CONVERTED LAB): Pro B Natriuretic peptide (BNP): <30

## 2011-05-03 LAB — LIPID PANEL
HDL: 54
Total CHOL/HDL Ratio: 2.4
VLDL: 7

## 2011-05-03 LAB — DIFFERENTIAL
Basophils Absolute: 0
Eosinophils Relative: 1
Lymphocytes Relative: 34
Neutro Abs: 3.7

## 2011-05-03 LAB — CBC
HCT: 35.7 — ABNORMAL LOW
Platelets: 274
RDW: 18.5 — ABNORMAL HIGH

## 2011-05-03 LAB — CK TOTAL AND CKMB (NOT AT ARMC)
CK, MB: 1.2
Relative Index: 1.1
Total CK: 117

## 2011-05-03 LAB — D-DIMER, QUANTITATIVE: D-Dimer, Quant: <0.22

## 2011-05-03 LAB — POCT CARDIAC MARKERS: CKMB, poc: <1 — ABNORMAL LOW

## 2011-05-03 LAB — APTT: aPTT: 27

## 2011-10-04 ENCOUNTER — Other Ambulatory Visit (INDEPENDENT_AMBULATORY_CARE_PROVIDER_SITE_OTHER): Payer: 59

## 2011-10-04 ENCOUNTER — Ambulatory Visit (INDEPENDENT_AMBULATORY_CARE_PROVIDER_SITE_OTHER): Payer: 59 | Admitting: Endocrinology

## 2011-10-04 ENCOUNTER — Encounter: Payer: Self-pay | Admitting: Endocrinology

## 2011-10-04 VITALS — BP 130/82 | HR 76 | Temp 97.6°F | Ht 65.0 in | Wt 149.0 lb

## 2011-10-04 DIAGNOSIS — R259 Unspecified abnormal involuntary movements: Secondary | ICD-10-CM

## 2011-10-04 DIAGNOSIS — IMO0002 Reserved for concepts with insufficient information to code with codable children: Secondary | ICD-10-CM

## 2011-10-04 DIAGNOSIS — R42 Dizziness and giddiness: Secondary | ICD-10-CM

## 2011-10-04 DIAGNOSIS — R209 Unspecified disturbances of skin sensation: Secondary | ICD-10-CM | POA: Insufficient documentation

## 2011-10-04 DIAGNOSIS — R251 Tremor, unspecified: Secondary | ICD-10-CM | POA: Insufficient documentation

## 2011-10-04 LAB — URINALYSIS, ROUTINE W REFLEX MICROSCOPIC
Bilirubin Urine: NEGATIVE
Ketones, ur: NEGATIVE
Leukocytes, UA: NEGATIVE
Nitrite: NEGATIVE
Specific Gravity, Urine: 1.015 (ref 1.000–1.030)
Urobilinogen, UA: 0.2 (ref 0.0–1.0)
pH: 6 (ref 5.0–8.0)

## 2011-10-04 LAB — BASIC METABOLIC PANEL
BUN: 10 mg/dL (ref 6–23)
CO2: 26 mEq/L (ref 19–32)
Calcium: 9.4 mg/dL (ref 8.4–10.5)
Creatinine, Ser: 1 mg/dL (ref 0.4–1.2)
GFR: 77.48 mL/min (ref >60.00)
Glucose, Bld: 95 mg/dL (ref 70–99)

## 2011-10-04 LAB — CBC WITH DIFFERENTIAL/PLATELET
Basophils Absolute: 0 10*3/uL (ref 0.0–0.1)
Eosinophils Absolute: 0.1 10*3/uL (ref 0.0–0.7)
Lymphocytes Relative: 38.6 % (ref 12.0–46.0)
MCHC: 31.5 g/dL (ref 30.0–36.0)
Monocytes Relative: 5.4 % (ref 3.0–12.0)
Platelets: 264 10*3/uL (ref 150.0–400.0)
RDW: 18 % — ABNORMAL HIGH (ref 11.5–14.6)

## 2011-10-04 MED ORDER — MECLIZINE HCL 25 MG PO TABS: 25.0000 mg | ORAL_TABLET | Freq: Three times a day (TID) | ORAL | Status: AC | PRN

## 2011-10-04 NOTE — Progress Notes (Signed)
Subjective:    Patient ID: [name], female    DOB: 07-01-1966, 46 y.o.   MRN: [id number]  HPI Pt states 1 month of intermittent moderate non-vertigenous dizziness, but no assoc n/v.  She feels as though her hands and feet are nor receiving enough blood supply.  She reports dark urine.  Past Medical History  Diagnosis Date  . ACUTE NASOPHARYNGITIS 05/07/2010  . BACK PAIN 02/05/2010  . CELLULITIS 01/13/2010  . CHEST PAIN UNSPECIFIED 08/23/2008  . CONSTIPATION, CHRONIC 04/09/2007  . DEEP VENOUS THROMBOPHLEBITIS, LEFT, LEG, HX OF 04/09/2007  . 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 DIZZINESS 08/15/2008  . DYSPNEA 05/20/2008  . FATIGUE 05/20/2008  . KNEE PAIN, LEFT 09/11/2007  . MIGRAINE HEADACHE 04/09/2007  . Palpitations 05/20/2008  . PREMATURE VENTRICULAR CONTRACTIONS 08/23/2008  . RASH-NONVESICULAR 01/17/2008  . RUQ PAIN 02/23/2010  . SICKLE CELL TRAIT 04/09/2007  . Tension headache 04/09/2007  . TONSILLITIS, CHRONIC 03/13/2008  . VARICOSE VEINS LOWER EXTREMITIES W/OTH COMPS 09/11/2007  . Allergic rhinitis, cause unspecified 04/19/2011  . Left sided sciatica 04/19/2011    Past Surgical History  Procedure Date  . Tonsillectomy   . Cesarean section     History   Social History  . Marital Status: Married    Spouse Name: N/A    Number of Children: 3  . Years of Education: N/A   Occupational History  . [employer] specialist     Social History Main Topics  . Smoking status: Never Smoker   . Smokeless tobacco: Not on file  . Alcohol Use: Yes  . Drug Use: No  . Sexually Active: Not on file   Other Topics Concern  . Not on file   Social History Narrative  . No narrative on file    Current Outpatient Prescriptions on File Prior to Visit  Medication Sig Dispense Refill  . aspirin 81 MG EC tablet Take 81 mg by mouth daily.        . fexofenadine (ALLEGRA) 180 MG tablet Take 1 tablet (180 mg total) by mouth daily.  30 tablet  2  . fluticasone (FLONASE) 50  MCG/ACT nasal spray Place 2 sprays into the nose daily.  16 g  2    No Known Allergies  Family History  Problem Relation Age of Onset  . Heart disease Mother   . Hypertension Mother   . Cancer Other     brain, lung and liver cancer    BP 130/82  Pulse 76  Temp(Src) 97.6 F (36.4 C) (Oral)  Ht 5\' 5"  (1.651 m)  Wt 149 lb (67.586 kg)  BMI 24.79 kg/m2  SpO2 96%  LMP 09/30/2011  Review of Systems She has intermittent pain at the left temporal area.  No loc    Objective:   Physical Exam VS: see vs page GEN: no distress HEAD: head: no deformity eyes: no periorbital swelling, no proptosis.  Optic fundi are normal external nose and ears are normal mouth: no lesion seen NECK: supple, thyroid is not enlarged CHEST WALL: no deformity LUNGS:  Clear to auscultation BREASTS:  No mass.  No d/c CV: reg rate and rhythm, no murmur ABD: abdomen is soft, nontender.  no hepatosplenomegaly.  not distended.  no hernia MUSCULOSKELETAL: muscle bulk and strength are grossly normal.  no obvious joint swelling.  gait is normal and steady EXTEMITIES: no deformity. no edema.  Feet are slightly cool Pulses: dorsalis pedis intact bilat.  no carotid bruit NEURO:  cn 2-12  grossly intact.   readily moves all 4's.  sensation is intact to touch on the feet.  Slight tremor of hands.  SKIN:  Normal texture and temperature.  No rash or suspicious lesion is visible.   NODES:  None palpable at the neck PSYCH: alert, oriented x3.  Does not appear anxious nor depressed.      Assessment & Plan:  Dizziness, uncertain etiology Urinary sxs, uncertain etiology

## 2011-10-04 NOTE — Patient Instructions (Addendum)
Let's check a circulation test. you will receive a phone call, about a day and time for an appointment blood tests are being requested for you today.  You will receive a letter with results.  i have sent a prescription to your pharmacy, for a medication to take as needed for dizziness. I hope you feel better soon.  If you don't feel better by next week, please call dr Jonny Ruiz.   (see letter)

## 2011-10-05 ENCOUNTER — Telehealth: Payer: Self-pay | Admitting: *Deleted

## 2011-10-05 NOTE — Telephone Encounter (Signed)
Pt informed of lab results. Also mailed letter to pt's address.

## 2011-11-01 ENCOUNTER — Encounter (INDEPENDENT_AMBULATORY_CARE_PROVIDER_SITE_OTHER): Payer: 59

## 2011-11-01 DIAGNOSIS — I739 Peripheral vascular disease, unspecified: Secondary | ICD-10-CM

## 2011-11-03 ENCOUNTER — Encounter: Payer: Self-pay | Admitting: Endocrinology

## 2011-11-05 ENCOUNTER — Telehealth: Payer: Self-pay | Admitting: *Deleted

## 2011-11-05 NOTE — Telephone Encounter (Signed)
Pt informed of results of circulation test.

## 2012-01-06 HISTORY — PX: TUBAL LIGATION: SHX77

## 2012-09-08 ENCOUNTER — Telehealth: Payer: Self-pay

## 2012-09-08 DIAGNOSIS — Z0000 Encounter for general adult medical examination without abnormal findings: Secondary | ICD-10-CM

## 2012-09-08 NOTE — Telephone Encounter (Signed)
Lab order entered.

## 2012-09-19 ENCOUNTER — Ambulatory Visit (HOSPITAL_COMMUNITY)
Admission: RE | Admit: 2012-09-19 | Discharge: 2012-09-19 | Disposition: A | Discharge status: Home / Self Care | Payer: 59 | Source: Ambulatory Visit | Attending: Obstetrics and Gynecology | Admitting: Obstetrics and Gynecology

## 2012-09-19 ENCOUNTER — Other Ambulatory Visit (HOSPITAL_COMMUNITY): Payer: Self-pay | Admitting: Obstetrics and Gynecology

## 2012-09-19 DIAGNOSIS — N200 Calculus of kidney: Secondary | ICD-10-CM | POA: Insufficient documentation

## 2012-10-04 ENCOUNTER — Encounter: Payer: Self-pay | Admitting: Internal Medicine

## 2012-10-04 ENCOUNTER — Other Ambulatory Visit (INDEPENDENT_AMBULATORY_CARE_PROVIDER_SITE_OTHER): Payer: 59

## 2012-10-04 ENCOUNTER — Ambulatory Visit (INDEPENDENT_AMBULATORY_CARE_PROVIDER_SITE_OTHER): Payer: 59 | Admitting: Internal Medicine

## 2012-10-04 VITALS — BP 114/80 | HR 57 | Temp 97.1°F | Ht 65.0 in | Wt 154.5 lb

## 2012-10-04 DIAGNOSIS — R253 Fasciculation: Secondary | ICD-10-CM | POA: Insufficient documentation

## 2012-10-04 DIAGNOSIS — R29810 Facial weakness: Secondary | ICD-10-CM | POA: Insufficient documentation

## 2012-10-04 LAB — CBC WITH DIFFERENTIAL/PLATELET
Eosinophils Relative: 6.2 % — ABNORMAL HIGH (ref 0.0–5.0)
HCT: 35.4 % — ABNORMAL LOW (ref 36.0–46.0)
Hemoglobin: 11.3 g/dL — ABNORMAL LOW (ref 12.0–15.0)
Lymphocytes Relative: 34.7 % (ref 12.0–46.0)
Lymphs Abs: 1.5 10*3/uL (ref 0.7–4.0)
Monocytes Relative: 5.8 % (ref 3.0–12.0)
Platelets: 278 10*3/uL (ref 150.0–400.0)
WBC: 4.2 10*3/uL — ABNORMAL LOW (ref 4.5–10.5)

## 2012-10-04 LAB — BASIC METABOLIC PANEL
BUN: 11 mg/dL (ref 6–23)
Calcium: 9.3 mg/dL (ref 8.4–10.5)
GFR: 87.1 mL/min (ref >60.00)
Glucose, Bld: 84 mg/dL (ref 70–99)

## 2012-10-04 LAB — LIPID PANEL
Cholesterol: 145 mg/dL (ref 0–200)
HDL: 64 mg/dL (ref >39.00)
Triglycerides: 29 mg/dL (ref 0.0–149.0)
VLDL: 5.8 mg/dL (ref 0.0–40.0)

## 2012-10-04 LAB — URINALYSIS, ROUTINE W REFLEX MICROSCOPIC
Bilirubin Urine: NEGATIVE
Nitrite: NEGATIVE
Specific Gravity, Urine: 1.02 (ref 1.000–1.030)
Total Protein, Urine: NEGATIVE
pH: 6 (ref 5.0–8.0)

## 2012-10-04 LAB — HEPATIC FUNCTION PANEL: Albumin: 4.2 g/dL (ref 3.5–5.2)

## 2012-10-04 LAB — TSH: TSH: 0.46 u[IU]/mL (ref 0.35–5.50)

## 2012-10-04 NOTE — Progress Notes (Signed)
Subjective:    Patient ID: [name], female    DOB: Jun 27, 1966, 47 y.o.   MRN: [id number]  HPI Here for wellness and f/u;  Overall doing ok;  Pt denies CP, worsening SOB, DOE, wheezing, orthopnea, PND, worsening LE edema, palpitations, dizziness or syncope.  Pt denies neurological change such as new headache, facial or extremity weakness.  Pt denies polydipsia, polyuria, or low sugar symptoms. Pt states overall good compliance with treatment and medications, good tolerability, and has been trying to follow lower cholesterol diet.  Pt denies worsening depressive symptoms, suicidal ideation or panic. No fever, night sweats, wt loss, loss of appetite, or other constitutional symptoms.  Pt states good ability with ADL's, has low fall risk, home safety reviewed and adequate, no other significant changes in hearing or vision, and only occasionally active with exercise.  Has adenomyosis, fibroids, recurring ovary cyst, for planned TAH vs tahbso - apr 7. Has hx of herpes labialis, also recent ? Left facial weakness, periorbital and tongue fasciculations as well  Past Medical History  Diagnosis Date  . ACUTE NASOPHARYNGITIS 05/07/2010  . BACK PAIN 02/05/2010  . CELLULITIS 01/13/2010  . CHEST PAIN UNSPECIFIED 08/23/2008  . CONSTIPATION, CHRONIC 04/09/2007  . DEEP VENOUS THROMBOPHLEBITIS, LEFT, LEG, HX OF 04/09/2007  . 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 DIZZINESS 08/15/2008  . DYSPNEA 05/20/2008  . FATIGUE 05/20/2008  . KNEE PAIN, LEFT 09/11/2007  . MIGRAINE HEADACHE 04/09/2007  . Palpitations 05/20/2008  . PREMATURE VENTRICULAR CONTRACTIONS 08/23/2008  . RASH-NONVESICULAR 01/17/2008  . RUQ PAIN 02/23/2010  . SICKLE CELL TRAIT 04/09/2007  . Tension headache 04/09/2007  . TONSILLITIS, CHRONIC 03/13/2008  . VARICOSE VEINS LOWER EXTREMITIES W/OTH COMPS 09/11/2007  . Allergic rhinitis, cause unspecified 04/19/2011  . Left sided sciatica 04/19/2011   Past Surgical History  Procedure Laterality Date  .  Tonsillectomy    . Cesarean section      reports that she has never smoked. She does not have any smokeless tobacco history on file. She reports that  drinks alcohol. She reports that she does not use illicit drugs. family history includes Cancer in her other; Heart disease in her mother; and Hypertension in her mother. No Known Allergies Current Outpatient Prescriptions on File Prior to Visit  Medication Sig Dispense Refill  . aspirin 81 MG EC tablet Take 81 mg by mouth daily.        . fexofenadine (ALLEGRA) 180 MG tablet Take 1 tablet (180 mg total) by mouth daily.  30 tablet  2  . fluticasone (FLONASE) 50 MCG/ACT nasal spray Place 2 sprays into the nose daily.  16 g  2   No current facility-administered medications on file prior to visit.   Review of Systems Constitutional: Negative for diaphoresis, activity change, appetite change or unexpected weight change.  HENT: Negative for hearing loss, ear pain, facial swelling, mouth sores and neck stiffness.   Eyes: Negative for pain, redness and visual disturbance.  Respiratory: Negative for shortness of breath and wheezing.   Cardiovascular: Negative for chest pain and palpitations.  Gastrointestinal: Negative for diarrhea, blood in stool, abdominal distention or other pain Genitourinary: Negative for hematuria, flank pain or change in urine volume.  Musculoskeletal: Negative for myalgias and joint swelling.  Skin: Negative for color change and wound.  Neurological: Negative for syncope and numbness. other than noted Hematological: Negative for adenopathy.  Psychiatric/Behavioral: Negative for hallucinations, self-injury, decreased concentration and agitation.      Objective:   Physical Exam BP  114/80  Pulse 57  Temp(Src) 97.1 F (36.2 C) (Oral)  Ht 5\' 5"  (1.651 m)  Wt 154 lb 8 oz (70.081 kg)  BMI 25.71 kg/m2  SpO2 97%  LMP 09/14/2012 VS noted,  Constitutional: Pt is oriented to person, place, and time. Appears well-developed  and well-nourished.  Head: Normocephalic and atraumatic.  Right Ear: External ear normal.  Left Ear: External ear normal.  Nose: Nose normal.  Mouth/Throat: Oropharynx is clear and moist.  Eyes: Conjunctivae and EOM are normal. Pupils are equal, round, and reactive to light.  Neck: Normal range of motion. Neck supple. No JVD present. No tracheal deviation present.  Cardiovascular: Normal rate, regular rhythm, normal heart sounds and intact distal pulses.   Pulmonary/Chest: Effort normal and breath sounds normal.  Abdominal: Soft. Bowel sounds are normal. There is no tenderness. No HSM  Musculoskeletal: Normal range of motion. Exhibits no edema.  Lymphadenopathy:  Has no cervical adenopathy.  Neurological: Pt is alert and oriented to person, place, and time. Pt has normal reflexes. No cranial nerve deficit. Except ? Mild left facial weakness/asymettry with some left nasolab fold straightening, no visible fasciculations Skin: Skin is warm and dry. No rash noted.  Psychiatric:  Has  normal mood and affect. Behavior is normal.     Assessment & Plan:

## 2012-10-04 NOTE — Patient Instructions (Addendum)
Please continue all other medications as before, and refills have been done if requested. Please go to the LAB in the Basement (turn left off the elevator) for the tests to be done today You will be contacted regarding the referral for: neurology Please keep your appointments with your specialists as you have planned  - surgury next week You will be contacted by phone if any changes related to the labs work needs to be made immediately.  Otherwise, you will receive a letter about your results with an explanation, but please check with MyChart first. Thank you for enrolling in MyChart. Please follow the instructions below to securely access your online medical record. MyChart allows you to send messages to your doctor, view your test results, renew your prescriptions, schedule appointments, and more. To Log into My Chart online, please go by [employer] or [employer] to [email or web address], or download the MyChart App from the [employer] of [employer] .  Your Username is:  [email or web address] ([email or web address]) Please send a practice Message on Mychart later today. Please return in 1 year for your yearly visit, or sooner if needed

## 2012-10-04 NOTE — Assessment & Plan Note (Signed)

## 2012-10-04 NOTE — Assessment & Plan Note (Signed)
None today, pt very concerned, for neuro referral

## 2012-10-04 NOTE — Assessment & Plan Note (Signed)
?   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

## 2012-10-12 ENCOUNTER — Encounter (HOSPITAL_COMMUNITY): Payer: Self-pay | Admitting: Pharmacist

## 2012-10-16 ENCOUNTER — Encounter (HOSPITAL_COMMUNITY): Payer: Self-pay

## 2012-10-16 ENCOUNTER — Encounter (HOSPITAL_COMMUNITY)
Admission: RE | Admit: 2012-10-16 | Discharge: 2012-10-16 | Disposition: A | Discharge status: Home / Self Care | Payer: 59 | Source: Ambulatory Visit | Attending: Obstetrics and Gynecology | Admitting: Obstetrics and Gynecology

## 2012-10-16 HISTORY — DX: Nausea with vomiting, unspecified: R11.2

## 2012-10-16 HISTORY — DX: Anemia, unspecified: D64.9

## 2012-10-16 HISTORY — DX: Other specified postprocedural states: Z98.890

## 2012-10-16 LAB — CBC
Hemoglobin: 11.7 g/dL — ABNORMAL LOW (ref 12.0–15.0)
MCH: 23.5 pg — ABNORMAL LOW (ref 26.0–34.0)
MCV: 73.4 fL — ABNORMAL LOW (ref 78.0–100.0)
Platelets: 302 10*3/uL (ref 150–400)
RBC: 4.97 MIL/uL (ref 3.87–5.11)
WBC: 4.6 10*3/uL (ref 4.0–10.5)

## 2012-10-16 LAB — SURGICAL PCR SCREEN: MRSA, PCR: NEGATIVE

## 2012-10-16 NOTE — Patient Instructions (Signed)
Your procedure is scheduled on:10/23/12  Enter through the Main Entrance at  6am: Pick up desk phone and dial [phone] and inform us of your arrival.  Please call [phone] if you have any problems the morning of surgery.  Remember: Do not eat or drink after midnight:Sunday   DO NOT wear jewelry, eye make-up, lipstick,body lotion, or dark fingernail polish. Do not shave for 48 hours prior to surgery.  If you are to be admitted after surgery, leave suitcase in car until your room has been assigned. Patients discharged on the day of surgery will not be allowed to drive home.

## 2012-10-17 NOTE — H&P (Signed)
[name]  DICTATION # [id number] CSN# [id number]   Meriel Pica, MD 10/17/2012 9:45 AM

## 2012-10-20 NOTE — H&P (Signed)
[name], [place]                 ACCOUNT NO.:  [id number]  MEDICAL RECORD NO.:  [id number]  LOCATION:                                 FACILITY:  PHYSICIAN:  Duke Salvia. Marcelle Overlie, M.D.DATE OF BIRTH:  1966-05-22  DATE OF ADMISSION: DATE OF DISCHARGE:                             HISTORY & PHYSICAL   CHIEF COMPLAINT:  Dysmenorrhea, pelvic pain.  HPI:  A 47 year old, G3, P3, prior tubal.  This patient has a 1 year history of dysmenorrhea and increased pelvic pain.  Evaluation in our office, ultrasound 10/13 demonstrated several small fibroids, some thickened myometrium suspicious for adenomyosis.  Due to worsening symptoms, she presents at this time for definitive LAVH.  This procedure including specific risks related to bleeding, infection, adjacent organ injury, the possible need for open additional surgery, transfusion, wound infection, phlebitis all discussed with her which she understands and accepts.  CT dated September 28, 2012, performed to rule out nephrolithiasis showed no acute findings in the abdomen or pelvis with no evidence of stone.  PAST MEDICAL HISTORY:  Allergies:  None. Current Medications:  Hydrocodone p.r.n.  SURGICAL HISTORY:  Cesarean section, tonsillectomy, tubal ligation with removal of cyst 2012.  REVIEW OF SYSTEMS:  Significant for history of HSV.  FAMILY HISTORY:  Otherwise negative.  SOCIAL HISTORY:  Denies drug or tobacco use.  Two drinks per month.  She is married.  Last Pap 1/14 was normal.  PHYSICAL EXAMINATION:  VITAL SIGNS:  Temp 98.2, blood pressure 120/78. HEENT:  Unremarkable. NECK:  Supple without masses. LUNGS:  Clear. CARDIOVASCULAR:  Regular rate and rhythm without murmurs, rubs, gallops noted. BREASTS:  Without masses. ABDOMEN:  Soft, flat, nontender. PELVIC:  Normal external genitalia.  Vagina and cervix were clear. Uterus was upper limit normal size, mobile.  Adnexa negative. EXTREMITIES:  Unremarkable. NEUROLOGIC:   Unremarkable.  IMPRESSION:  Dysmenorrhea, pelvic pain, possible adenomyosis.  PLAN:  LAVH procedure and risks discussed as above.     Lenisha Lacap M. Marcelle Overlie, M.D.     RM[facility]/MEDQ  D:  10/17/2012  T:  10/17/2012  Job:  [id number]

## 2012-10-23 ENCOUNTER — Ambulatory Visit (HOSPITAL_COMMUNITY): Payer: 59 | Admitting: Anesthesiology

## 2012-10-23 ENCOUNTER — Encounter (HOSPITAL_COMMUNITY): Payer: Self-pay | Admitting: Anesthesiology

## 2012-10-23 ENCOUNTER — Encounter (HOSPITAL_COMMUNITY)
Admission: RE | Disposition: A | Discharge status: Home / Self Care | Payer: Self-pay | Source: Ambulatory Visit | Attending: Obstetrics and Gynecology

## 2012-10-23 ENCOUNTER — Encounter (HOSPITAL_COMMUNITY): Payer: Self-pay | Admitting: *Deleted

## 2012-10-23 ENCOUNTER — Observation Stay (HOSPITAL_COMMUNITY)
Admission: RE | Admit: 2012-10-23 | Discharge: 2012-10-24 | Disposition: A | Discharge status: Home / Self Care | Payer: 59 | Source: Ambulatory Visit | Attending: Obstetrics and Gynecology | Admitting: Obstetrics and Gynecology

## 2012-10-23 DIAGNOSIS — D251 Intramural leiomyoma of uterus: Secondary | ICD-10-CM | POA: Insufficient documentation

## 2012-10-23 DIAGNOSIS — N949 Unspecified condition associated with female genital organs and menstrual cycle: Secondary | ICD-10-CM | POA: Insufficient documentation

## 2012-10-23 DIAGNOSIS — N8000 Endometriosis of the uterus, unspecified: Secondary | ICD-10-CM | POA: Insufficient documentation

## 2012-10-23 DIAGNOSIS — N946 Dysmenorrhea, unspecified: Principal | ICD-10-CM | POA: Insufficient documentation

## 2012-10-23 DIAGNOSIS — N8003 Adenomyosis of the uterus: Secondary | ICD-10-CM

## 2012-10-23 HISTORY — PX: LAPAROSCOPIC ASSISTED VAGINAL HYSTERECTOMY: SHX5398

## 2012-10-23 LAB — CBC
Platelets: 189 10*3/uL (ref 150–400)
RBC: 4.2 MIL/uL (ref 3.87–5.11)
WBC: 6 10*3/uL (ref 4.0–10.5)

## 2012-10-23 LAB — PREGNANCY, URINE: Preg Test, Ur: NEGATIVE

## 2012-10-23 LAB — CREATININE, SERUM
Creatinine, Ser: 0.8 mg/dL (ref 0.50–1.10)
GFR calc Af Amer: >90 mL/min (ref >90)

## 2012-10-23 SURGERY — HYSTERECTOMY, VAGINAL, LAPAROSCOPY-ASSISTED
Anesthesia: General | Site: Abdomen | Wound class: Clean Contaminated

## 2012-10-23 MED ORDER — SODIUM CHLORIDE 0.9 % IJ SOLN
INTRAMUSCULAR | Status: DC | PRN
  Administered 2012-10-23: 3 mL

## 2012-10-23 MED ORDER — OXYCODONE-ACETAMINOPHEN 5-325 MG PO TABS
1.0000 | ORAL_TABLET | ORAL | Status: DC | PRN
  Administered 2012-10-24 (×2): 2 via ORAL
  Filled 2012-10-23 (×2): qty 2

## 2012-10-23 MED ORDER — LIDOCAINE HCL (CARDIAC) 20 MG/ML IV SOLN
INTRAVENOUS | Status: AC
  Filled 2012-10-23: qty 5

## 2012-10-23 MED ORDER — LIDOCAINE HCL (CARDIAC) 20 MG/ML IV SOLN
INTRAVENOUS | Status: DC | PRN
  Administered 2012-10-23: 50 mg via INTRAVENOUS

## 2012-10-23 MED ORDER — DIPHENHYDRAMINE HCL 50 MG/ML IJ SOLN
12.5000 mg | Freq: Four times a day (QID) | INTRAMUSCULAR | Status: DC | PRN
  Administered 2012-10-23: 12.5 mg via INTRAVENOUS
  Filled 2012-10-23: qty 1

## 2012-10-23 MED ORDER — GLYCOPYRROLATE 0.2 MG/ML IJ SOLN
INTRAMUSCULAR | Status: AC
  Filled 2012-10-23: qty 2

## 2012-10-23 MED ORDER — BUPIVACAINE HCL (PF) 0.25 % IJ SOLN
INTRAMUSCULAR | Status: DC | PRN
  Administered 2012-10-23: 4 mL

## 2012-10-23 MED ORDER — FENTANYL CITRATE 0.05 MG/ML IJ SOLN
INTRAMUSCULAR | Status: DC | PRN
  Administered 2012-10-23 (×3): 50 ug via INTRAVENOUS
  Administered 2012-10-23: 100 ug via INTRAVENOUS

## 2012-10-23 MED ORDER — BUPIVACAINE HCL (PF) 0.25 % IJ SOLN
INTRAMUSCULAR | Status: AC
  Filled 2012-10-23: qty 30

## 2012-10-23 MED ORDER — NALOXONE HCL 0.4 MG/ML IJ SOLN: 0.4000 mg | INTRAMUSCULAR | Status: DC | PRN

## 2012-10-23 MED ORDER — HYDROMORPHONE HCL PF 1 MG/ML IJ SOLN
0.2500 mg | INTRAMUSCULAR | Status: DC | PRN
  Administered 2012-10-23: 0.5 mg via INTRAVENOUS

## 2012-10-23 MED ORDER — DEXAMETHASONE SODIUM PHOSPHATE 10 MG/ML IJ SOLN
INTRAMUSCULAR | Status: DC | PRN
  Administered 2012-10-23: 10 mg via INTRAVENOUS

## 2012-10-23 MED ORDER — HYDROMORPHONE HCL PF 1 MG/ML IJ SOLN
INTRAMUSCULAR | Status: AC
  Administered 2012-10-23: 0.5 mg via INTRAVENOUS
  Filled 2012-10-23: qty 1

## 2012-10-23 MED ORDER — MIDAZOLAM HCL 5 MG/5ML IJ SOLN
INTRAMUSCULAR | Status: DC | PRN
  Administered 2012-10-23: 2 mg via INTRAVENOUS

## 2012-10-23 MED ORDER — ONDANSETRON HCL 4 MG/2ML IJ SOLN
INTRAMUSCULAR | Status: AC
  Filled 2012-10-23: qty 2

## 2012-10-23 MED ORDER — PROPOFOL 10 MG/ML IV EMUL
INTRAVENOUS | Status: AC
  Filled 2012-10-23: qty 20

## 2012-10-23 MED ORDER — ROCURONIUM BROMIDE 100 MG/10ML IV SOLN
INTRAVENOUS | Status: DC | PRN
  Administered 2012-10-23: 40 mg via INTRAVENOUS
  Administered 2012-10-23: 10 mg via INTRAVENOUS

## 2012-10-23 MED ORDER — ONDANSETRON HCL 4 MG/2ML IJ SOLN
INTRAMUSCULAR | Status: DC | PRN
  Administered 2012-10-23: 4 mg via INTRAVENOUS

## 2012-10-23 MED ORDER — DEXAMETHASONE SODIUM PHOSPHATE 10 MG/ML IJ SOLN
INTRAMUSCULAR | Status: AC
  Filled 2012-10-23: qty 1

## 2012-10-23 MED ORDER — MEPERIDINE HCL 25 MG/ML IJ SOLN: 6.2500 mg | INTRAMUSCULAR | Status: DC | PRN

## 2012-10-23 MED ORDER — DEXTROSE IN LACTATED RINGERS 5 % IV SOLN
INTRAVENOUS | Status: DC
  Administered 2012-10-23 – 2012-10-24 (×3): via INTRAVENOUS

## 2012-10-23 MED ORDER — SODIUM CHLORIDE 0.9 % IJ SOLN: 9.0000 mL | INTRAMUSCULAR | Status: DC | PRN

## 2012-10-23 MED ORDER — SCOPOLAMINE 1 MG/3DAYS TD PT72: 1.0000 | MEDICATED_PATCH | TRANSDERMAL | Status: DC

## 2012-10-23 MED ORDER — KETOROLAC TROMETHAMINE 30 MG/ML IJ SOLN
30.0000 mg | Freq: Four times a day (QID) | INTRAMUSCULAR | Status: DC
  Administered 2012-10-23 – 2012-10-24 (×3): 30 mg via INTRAVENOUS
  Filled 2012-10-23: qty 1

## 2012-10-23 MED ORDER — CEFAZOLIN SODIUM-DEXTROSE 2-3 GM-% IV SOLR
2.0000 g | INTRAVENOUS | Status: AC
  Administered 2012-10-23: 2 g via INTRAVENOUS

## 2012-10-23 MED ORDER — KETOROLAC TROMETHAMINE 30 MG/ML IJ SOLN
INTRAMUSCULAR | Status: DC | PRN
  Administered 2012-10-23: 30 mg via INTRAVENOUS

## 2012-10-23 MED ORDER — ACETAMINOPHEN 10 MG/ML IV SOLN
INTRAVENOUS | Status: AC
  Administered 2012-10-23: 1000 mg via INTRAVENOUS
  Filled 2012-10-23: qty 100

## 2012-10-23 MED ORDER — MIDAZOLAM HCL 2 MG/2ML IJ SOLN
INTRAMUSCULAR | Status: AC
  Filled 2012-10-23: qty 2

## 2012-10-23 MED ORDER — ACETAMINOPHEN 10 MG/ML IV SOLN: 1000.0000 mg | Freq: Once | INTRAVENOUS | Status: DC

## 2012-10-23 MED ORDER — DIPHENHYDRAMINE HCL 12.5 MG/5ML PO ELIX: 12.5000 mg | ORAL_SOLUTION | Freq: Four times a day (QID) | ORAL | Status: DC | PRN

## 2012-10-23 MED ORDER — ONDANSETRON HCL 4 MG/2ML IJ SOLN: 4.0000 mg | Freq: Four times a day (QID) | INTRAMUSCULAR | Status: DC | PRN

## 2012-10-23 MED ORDER — NEOSTIGMINE METHYLSULFATE 1 MG/ML IJ SOLN
INTRAMUSCULAR | Status: AC
  Filled 2012-10-23: qty 1

## 2012-10-23 MED ORDER — LACTATED RINGERS IR SOLN
Status: DC | PRN
  Administered 2012-10-23: 3000 mL

## 2012-10-23 MED ORDER — EPHEDRINE SULFATE 50 MG/ML IJ SOLN
INTRAMUSCULAR | Status: DC | PRN
  Administered 2012-10-23 (×2): 10 mg via INTRAVENOUS

## 2012-10-23 MED ORDER — FENTANYL CITRATE 0.05 MG/ML IJ SOLN
INTRAMUSCULAR | Status: AC
  Filled 2012-10-23: qty 5

## 2012-10-23 MED ORDER — GLYCOPYRROLATE 0.2 MG/ML IJ SOLN
INTRAMUSCULAR | Status: DC | PRN
  Administered 2012-10-23: .4 mg via INTRAVENOUS

## 2012-10-23 MED ORDER — KETOROLAC TROMETHAMINE 30 MG/ML IJ SOLN
INTRAMUSCULAR | Status: AC
  Filled 2012-10-23: qty 1

## 2012-10-23 MED ORDER — MORPHINE SULFATE (PF) 1 MG/ML IV SOLN
INTRAVENOUS | Status: DC
  Administered 2012-10-23: 14 mL via INTRAVENOUS
  Administered 2012-10-23: 11 mL via INTRAVENOUS
  Administered 2012-10-23: 11:00:00 via INTRAVENOUS
  Administered 2012-10-23: 23 mg via INTRAVENOUS
  Filled 2012-10-23 (×2): qty 25

## 2012-10-23 MED ORDER — METOCLOPRAMIDE HCL 5 MG/ML IJ SOLN: 10.0000 mg | Freq: Once | INTRAMUSCULAR | Status: DC | PRN

## 2012-10-23 MED ORDER — NEOSTIGMINE METHYLSULFATE 1 MG/ML IJ SOLN
INTRAMUSCULAR | Status: DC | PRN
  Administered 2012-10-23: 3 mg via INTRAVENOUS

## 2012-10-23 MED ORDER — ENOXAPARIN SODIUM 40 MG/0.4ML ~~LOC~~ SOLN
40.0000 mg | SUBCUTANEOUS | Status: DC
  Administered 2012-10-24: 40 mg via SUBCUTANEOUS
  Filled 2012-10-23 (×2): qty 0.4

## 2012-10-23 MED ORDER — KETOROLAC TROMETHAMINE 30 MG/ML IJ SOLN: 30.0000 mg | Freq: Four times a day (QID) | INTRAMUSCULAR | Status: DC

## 2012-10-23 MED ORDER — SCOPOLAMINE 1 MG/3DAYS TD PT72
MEDICATED_PATCH | TRANSDERMAL | Status: AC
  Administered 2012-10-23: 1.5 mg via TRANSDERMAL
  Filled 2012-10-23: qty 1

## 2012-10-23 MED ORDER — ROCURONIUM BROMIDE 50 MG/5ML IV SOLN
INTRAVENOUS | Status: AC
  Filled 2012-10-23: qty 1

## 2012-10-23 MED ORDER — CEFAZOLIN SODIUM-DEXTROSE 2-3 GM-% IV SOLR
INTRAVENOUS | Status: AC
  Filled 2012-10-23: qty 50

## 2012-10-23 MED ORDER — MENTHOL 3 MG MT LOZG: 1.0000 | LOZENGE | OROMUCOSAL | Status: DC | PRN

## 2012-10-23 MED ORDER — PROPOFOL 10 MG/ML IV EMUL
INTRAVENOUS | Status: DC | PRN
  Administered 2012-10-23: 150 mg via INTRAVENOUS
  Administered 2012-10-23: 50 mg via INTRAVENOUS

## 2012-10-23 MED ORDER — DIPHENHYDRAMINE HCL 50 MG/ML IJ SOLN
INTRAMUSCULAR | Status: AC
  Administered 2012-10-23: 12.5 mg via INTRAVENOUS
  Filled 2012-10-23: qty 1

## 2012-10-23 MED ORDER — BUTORPHANOL TARTRATE 1 MG/ML IJ SOLN: 1.0000 mg | INTRAMUSCULAR | Status: DC | PRN

## 2012-10-23 MED ORDER — ZOLPIDEM TARTRATE 5 MG PO TABS: 5.0000 mg | ORAL_TABLET | Freq: Every evening | ORAL | Status: DC | PRN

## 2012-10-23 MED ORDER — DIPHENHYDRAMINE HCL 50 MG/ML IJ SOLN: 12.5000 mg | Freq: Once | INTRAMUSCULAR | Status: AC

## 2012-10-23 MED ORDER — KETOROLAC TROMETHAMINE 30 MG/ML IJ SOLN
30.0000 mg | Freq: Once | INTRAMUSCULAR | Status: DC
  Filled 2012-10-23 (×2): qty 1

## 2012-10-23 MED ORDER — LACTATED RINGERS IV SOLN
INTRAVENOUS | Status: DC
  Administered 2012-10-23 (×2): via INTRAVENOUS

## 2012-10-23 SURGICAL SUPPLY — 33 items
CABLE HIGH FREQUENCY MONO STRZ (ELECTRODE) IMPLANT
CATH ROBINSON RED A/P 16FR (CATHETERS) IMPLANT
CLOTH BEACON ORANGE TIMEOUT ST (SAFETY) ×2 IMPLANT
CONT PATH 16OZ SNAP LID 3702 (MISCELLANEOUS) ×2 IMPLANT
COVER TABLE BACK 60X90 (DRAPES) ×2 IMPLANT
DECANTER SPIKE VIAL GLASS SM (MISCELLANEOUS) ×2 IMPLANT
DERMABOND ADVANCED (GAUZE/BANDAGES/DRESSINGS) ×1
DERMABOND ADVANCED .7 DNX12 (GAUZE/BANDAGES/DRESSINGS) ×1 IMPLANT
ELECT LIGASURE LONG (ELECTRODE) ×2 IMPLANT
ELECT REM PT RETURN 9FT ADLT (ELECTROSURGICAL) ×2
ELECTRODE REM PT RTRN 9FT ADLT (ELECTROSURGICAL) ×1 IMPLANT
GLOVE BIO SURGEON STRL SZ7 (GLOVE) ×4 IMPLANT
GLOVE BIOGEL PI IND STRL 6.5 (GLOVE) ×1 IMPLANT
GLOVE BIOGEL PI INDICATOR 6.5 (GLOVE) ×1
GOWN STRL REIN XL XLG (GOWN DISPOSABLE) ×8 IMPLANT
NEEDLE INSUFFLATION 120MM (ENDOMECHANICALS) ×2 IMPLANT
NS IRRIG 1000ML POUR BTL (IV SOLUTION) ×2 IMPLANT
PACK LAVH (CUSTOM PROCEDURE TRAY) ×2 IMPLANT
PROTECTOR NERVE ULNAR (MISCELLANEOUS) ×2 IMPLANT
SEALER TISSUE G2 CVD JAW 45CM (ENDOMECHANICALS) ×4 IMPLANT
SET IRRIG TUBING LAPAROSCOPIC (IRRIGATION / IRRIGATOR) ×2 IMPLANT
SUT MON AB 2-0 CT1 36 (SUTURE) ×4 IMPLANT
SUT VIC AB 0 CT1 18XCR BRD8 (SUTURE) ×3 IMPLANT
SUT VIC AB 0 CT1 36 (SUTURE) ×2 IMPLANT
SUT VIC AB 0 CT1 8-18 (SUTURE) ×3
SUT VICRYL 0 TIES 12 18 (SUTURE) ×2 IMPLANT
SUT VICRYL 4-0 PS2 18IN ABS (SUTURE) ×2 IMPLANT
TOWEL OR 17X24 6PK STRL BLUE (TOWEL DISPOSABLE) ×4 IMPLANT
TRAY FOLEY CATH 14FR (SET/KITS/TRAYS/PACK) ×2 IMPLANT
TROCAR OPTI TIP 5M 100M (ENDOMECHANICALS) ×4 IMPLANT
TROCAR XCEL DIL TIP R 11M (ENDOMECHANICALS) ×2 IMPLANT
WARMER LAPAROSCOPE (MISCELLANEOUS) ×2 IMPLANT
WATER STERILE IRR 1000ML POUR (IV SOLUTION) ×2 IMPLANT

## 2012-10-23 NOTE — Op Note (Signed)
Preoperative diagnosis: Pelvic pain, leiomyoma, adenomyosis  Postoperative diagnosis: Same  Procedure: LAVH  Surgeon: Marcelle Overlie  Assistant: McComb  EBL: 300 cc  Specimens removed: Uterus and cervix, to pathology  Drains: Foley to straight drain  Complications: None  Procedure and findings:  The patient taken the operating room after an adequate level of general anesthesia was obtained with the legs in stirrups, appropriate timeout for taken at that point. The abdomen and vagina were prepped and draped in the usual fashion for LAVH Foley catheter positioned draining clear urine. Hulka tenaculum was positioned. Attention directed to the abdomen where the subumbilical area was infiltrated with quarter percent Marcaine plain, small incision was made in the varies needle was introduced without difficulty.. Its intra-abdominal position was verified by pressure water testing. A 3 L pneumoperitoneum was then created in and lap scopic trocar and sleeve were then introduced, no evidence of any bleeding or trauma. 3 finger breaths above the symphysis in the midline a 5 mm trocar was inserted under direct visualization.   Pelvic findings as follows: The uterus itself was 4-6 weeks size symmetrically enlarged slightly irregular at the fundus she had a prior Filshie clip tubal bilateral adnexa pelvic sidewall upper abdomen otherwise unremarkable. Using the in seal device starting on the right the utero-ovarian pedicle was coagulated down to and including the round ligament with excellent hemostasis the exact same repeated on the opposite side thus conserving both ovaries. The vaginal portion the procedure started at this point   The legs were extended weighted speculum was positioned cervix grasped with tenaculum the cervical vaginal mucosa was incised, bladder advanced superiorly with sharp and blunt dissection. Posterior culdotomy performed without difficulty, in sequential fashion the uterosacral ligament,  cardinal ligament and uterine basket or pedicles were clamped divided and suture ligated with 0 Vicryl suture. This was done after assuring that the bladder was well below, the anterior peritoneal reflection could be identified this was entered sharply and a retractor used to gently elevate the bladder out of the field. Remaining upper broad ligament pedicles were clamped divided and suture ligated. The fundus of the uterus is in delivered posteriorly remaining pedicles clamped divided and free tie with 0 Vicryl suture vaginal cuff closed from 3 to 9:00 with a running locked 2-0 Vicryl suture. Prior to closure sponge denies precast reported as correct x2 vaginal mucosa closed right to left with interrupted 2-0 Monocryl sutures with excellent hemostasis clear urine noted throughout. Repeat laparoscopy was then carried out with the [place], excellent hemostasis even at reduced pressure. Its was removed gas allowed to escape the upper defect closed with 4-0 Vicryl subcuticular suture and Dermabond on the lower incision she tolerated this well went to recovery in good condition.  Dictated with [facility]  Tynell Winchell M. Milana Obey.D.

## 2012-10-23 NOTE — Progress Notes (Signed)
The patient was re-examined with no change in status 

## 2012-10-23 NOTE — Anesthesia Postprocedure Evaluation (Signed)
  Anesthesia Post-op Note  Patient: [name]  Procedure(s) Performed: Procedure(s): LAPAROSCOPIC ASSISTED VAGINAL HYSTERECTOMY (N/A)  Patient Location: PACU  Anesthesia Type:General  Level of Consciousness: awake, alert  and oriented  Airway and Oxygen Therapy: Patient Spontanous Breathing  Post-op Pain: none  Post-op Assessment: Post-op Vital signs reviewed, Patient's Cardiovascular Status Stable, Respiratory Function Stable, Patent Airway, No signs of Nausea or vomiting and Pain level controlled  Post-op Vital Signs: Reviewed and stable  Complications: No apparent anesthesia complications

## 2012-10-23 NOTE — Transfer of Care (Signed)
Immediate Anesthesia Transfer of Care Note  Patient: [name]  Procedure(s) Performed: Procedure(s): LAPAROSCOPIC ASSISTED VAGINAL HYSTERECTOMY (N/A)  Patient Location: PACU  Anesthesia Type:General  Level of Consciousness: awake  Airway & Oxygen Therapy: Patient Spontanous Breathing  Post-op Assessment: Report given to PACU RN  Post vital signs: stable  Filed Vitals:   10/23/12 0625  BP: 121/78  Pulse: 91  Temp: 36.7 C  Resp: 16    Complications: No apparent anesthesia complications

## 2012-10-23 NOTE — Anesthesia Postprocedure Evaluation (Signed)
  Anesthesia Post-op Note  Patient: [name]  Procedure(s) Performed: Procedure(s): LAPAROSCOPIC ASSISTED VAGINAL HYSTERECTOMY (N/A)  Patient Location: [facility]  Anesthesia Type:General  Level of Consciousness: awake, alert , oriented and patient cooperative  Airway and Oxygen Therapy: Patient Spontanous Breathing and Patient connected to nasal cannula oxygen  Post-op Pain: mild  Post-op Assessment: Patient's Cardiovascular Status Stable, Respiratory Function Stable, Patent Airway, No signs of Nausea or vomiting, Adequate PO intake and Pain level controlled  Post-op Vital Signs: Reviewed and stable  Complications: No apparent anesthesia complications

## 2012-10-23 NOTE — Anesthesia Preprocedure Evaluation (Signed)
Anesthesia Evaluation  Patient identified by MRN, date of birth, ID band Patient awake    Reviewed: Allergy & Precautions, H&P , NPO status , Patient's Chart, lab work & pertinent test results  History of Anesthesia Complications (+) PONV  Airway Mallampati: II TM Distance: >3 FB Neck ROM: Full    Dental no notable dental hx. 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   Neuro/Psych  Headaches, Anxiety  Neuromuscular disease    GI/Hepatic negative GI ROS, Neg liver ROS,   Endo/Other    Renal/GU   negative genitourinary   Musculoskeletal   Abdominal   Peds  Hematology  (+) Blood dyscrasia, Sickle cell trait and anemia ,   Anesthesia Other Findings   Reproductive/Obstetrics Pelvic pain  Dysmenorrhea                           Anesthesia Physical Anesthesia Plan  ASA: II  Anesthesia Plan: General   Post-op Pain Management:    Induction: Intravenous  Airway Management Planned: Oral ETT  Additional Equipment:   Intra-op Plan:   Post-operative Plan: Extubation in OR  Informed Consent: I have reviewed the patients History and Physical, chart, labs and discussed the procedure including the risks, benefits and alternatives for the proposed anesthesia with the patient or authorized representative who has indicated his/her understanding and acceptance.   Dental advisory given  Plan Discussed with: CRNA, Anesthesiologist and Surgeon  Anesthesia Plan Comments:         Anesthesia Quick Evaluation

## 2012-10-24 ENCOUNTER — Encounter (HOSPITAL_COMMUNITY): Payer: Self-pay | Admitting: Obstetrics and Gynecology

## 2012-10-24 LAB — CBC
MCHC: 32 g/dL (ref 30.0–36.0)
MCV: 73.7 fL — ABNORMAL LOW (ref 78.0–100.0)
Platelets: 204 10*3/uL (ref 150–400)
RDW: 17.9 % — ABNORMAL HIGH (ref 11.5–15.5)
WBC: 9.4 10*3/uL (ref 4.0–10.5)

## 2012-10-24 MED ORDER — ENOXAPARIN SODIUM 40 MG/0.4ML ~~LOC~~ SOLN: 40.0000 mg | Freq: Every day | SUBCUTANEOUS | Status: DC

## 2012-10-24 MED ORDER — OXYCODONE-ACETAMINOPHEN 5-325 MG PO TABS: 1.0000 | ORAL_TABLET | ORAL | Status: DC | PRN

## 2012-10-24 NOTE — Progress Notes (Signed)
1 Day Post-Op Procedure(s) (LRB): LAPAROSCOPIC ASSISTED VAGINAL HYSTERECTOMY (N/A)  Subjective: Patient reports tolerating PO.    Objective: I have reviewed patient's vital signs and labs. BP 99/63  Pulse 86  Temp(Src) 98 F (36.7 C) (Oral)  Resp 17  Ht 5\' 5"  (1.651 m)  Wt 68.947 kg (152 lb)  BMI 25.29 kg/m2  SpO2 99%  LMP 10/06/2012 CBC    Component Value Date/Time   WBC 9.4 10/24/2012 0540   RBC 3.99 10/24/2012 0540   HGB 9.4* 10/24/2012 0540   HCT 29.4* 10/24/2012 0540   PLT 204 10/24/2012 0540   MCV 73.7* 10/24/2012 0540   MCH 23.6* 10/24/2012 0540   MCHC 32.0 10/24/2012 0540   RDW 17.9* 10/24/2012 0540   LYMPHSABS 1.5 10/04/2012 1100   MONOABS 0.2 10/04/2012 1100   EOSABS 0.3 10/04/2012 1100   BASOSABS 0.1 10/04/2012 1100      abd soft + BNS, INCs c/d  Assessment: s/p Procedure(s): LAPAROSCOPIC ASSISTED VAGINAL HYSTERECTOMY (N/A): stable  Plan: Discharge home  LOS: 1 day    [name] M 10/24/2012, 7:24 AM

## 2012-10-24 NOTE — Progress Notes (Signed)
Pt out in wheelchair  Teaching complete   

## 2012-10-24 NOTE — Discharge Summary (Signed)
Physician Discharge Summary  Patient ID: NIKALA WALSWORTH MRN: [id number] DOB/AGE: Dec 04, 1965 47 y.o.  Admit date: 10/23/2012 Discharge date: 10/24/2012  Admission Diagnoses:  Discharge Diagnoses:  Active Problems:   Adenomyosis   Discharged Condition: good  Hospital Course: LAVH, D/C on POD # 1, afeb, tol PO  Consults: None  Significant Diagnostic Studies: labs:  CBC    Component Value Date/Time   WBC 9.4 10/24/2012 0540   RBC 3.99 10/24/2012 0540   HGB 9.4* 10/24/2012 0540   HCT 29.4* 10/24/2012 0540   PLT 204 10/24/2012 0540   MCV 73.7* 10/24/2012 0540   MCH 23.6* 10/24/2012 0540   MCHC 32.0 10/24/2012 0540   RDW 17.9* 10/24/2012 0540   LYMPHSABS 1.5 10/04/2012 1100   MONOABS 0.2 10/04/2012 1100   EOSABS 0.3 10/04/2012 1100   BASOSABS 0.1 10/04/2012 1100      Treatments: surgery: LAVH  Discharge Exam: Blood pressure 99/63, pulse 86, temperature 98 F (36.7 C), temperature source Oral, resp. rate 17, height 5\' 5"  (1.651 m), weight 68.947 kg (152 lb), last menstrual period 10/06/2012, SpO2 99.00%. abd soft + BS, Incs C/D  Disposition: 01-Home or Self Care     Medication List    STOP taking these medications       HYDROcodone-acetaminophen 5-325 MG per tablet  Commonly known as:  NORCO/VICODIN      TAKE these medications       aspirin 81 MG tablet  Take 81 mg by mouth daily.     enoxaparin 40 MG/0.4ML injection  Commonly known as:  LOVENOX  Inject 0.4 mLs (40 mg total) into the skin daily. Once daily X 30 days     ibuprofen 200 MG tablet  Commonly known as:  ADVIL,MOTRIN  Take 400 mg by mouth daily as needed for headache.     oxyCODONE-acetaminophen 5-325 MG per tablet  Commonly known as:  PERCOCET/ROXICET  Take 1-2 tablets by mouth every 4 (four) hours as needed.           Follow-up Information   Follow up with Meriel Pica, MD. Schedule an appointment as soon as possible for a visit in 1 week.   Contact information:   [address] [place] [address] [phone]       Signed: Meriel Pica 10/24/2012, 7:29 AM

## 2012-10-31 ENCOUNTER — Ambulatory Visit (HOSPITAL_COMMUNITY)
Admission: RE | Admit: 2012-10-31 | Discharge: 2012-10-31 | Disposition: A | Discharge status: Home / Self Care | Payer: 59 | Source: Ambulatory Visit | Attending: Obstetrics and Gynecology | Admitting: Obstetrics and Gynecology

## 2012-10-31 ENCOUNTER — Other Ambulatory Visit (HOSPITAL_COMMUNITY): Payer: Self-pay | Admitting: Obstetrics and Gynecology

## 2012-10-31 DIAGNOSIS — M25562 Pain in left knee: Secondary | ICD-10-CM

## 2012-10-31 DIAGNOSIS — I8390 Asymptomatic varicose veins of unspecified lower extremity: Secondary | ICD-10-CM | POA: Insufficient documentation

## 2012-10-31 DIAGNOSIS — M79609 Pain in unspecified limb: Secondary | ICD-10-CM

## 2012-10-31 NOTE — Progress Notes (Signed)
*  PRELIMINARY RESULTS* Vascular Ultrasound Left lower extremity venous duplex has been completed.  Preliminary findings: Left:  No evidence of DVT, superficial thrombosis, or Baker's cyst.   Farrel Demark, RDMS, RVT  10/31/2012, 2:12 PM

## 2012-11-19 ENCOUNTER — Inpatient Hospital Stay (HOSPITAL_COMMUNITY)
Admission: AD | Admit: 2012-11-19 | Discharge: 2012-11-19 | Disposition: A | Discharge status: Home / Self Care | Payer: 59 | Source: Ambulatory Visit | Attending: Obstetrics and Gynecology | Admitting: Obstetrics and Gynecology

## 2012-11-19 ENCOUNTER — Encounter (HOSPITAL_COMMUNITY): Payer: Self-pay | Admitting: *Deleted

## 2012-11-19 DIAGNOSIS — Z90710 Acquired absence of both cervix and uterus: Secondary | ICD-10-CM | POA: Insufficient documentation

## 2012-11-19 DIAGNOSIS — R42 Dizziness and giddiness: Secondary | ICD-10-CM

## 2012-11-19 LAB — COMPREHENSIVE METABOLIC PANEL
ALT: 17 U/L (ref 0–35)
Alkaline Phosphatase: 50 U/L (ref 39–117)
CO2: 24 mEq/L (ref 19–32)
GFR calc Af Amer: 89 mL/min — ABNORMAL LOW (ref >90)
GFR calc non Af Amer: 77 mL/min — ABNORMAL LOW (ref >90)
Glucose, Bld: 88 mg/dL (ref 70–99)
Potassium: 3.7 mEq/L (ref 3.5–5.1)
Sodium: 137 mEq/L (ref 135–145)

## 2012-11-19 LAB — CBC
Hemoglobin: 11.7 g/dL — ABNORMAL LOW (ref 12.0–15.0)
RBC: 4.97 MIL/uL (ref 3.87–5.11)

## 2012-11-19 MED ORDER — MECLIZINE HCL 25 MG PO TABS
25.0000 mg | ORAL_TABLET | Freq: Once | ORAL | Status: AC
  Administered 2012-11-19: 25 mg via ORAL
  Filled 2012-11-19: qty 1

## 2012-11-19 MED ORDER — MECLIZINE HCL 25 MG PO TABS: 25.0000 mg | ORAL_TABLET | Freq: Three times a day (TID) | ORAL | Status: DC | PRN

## 2012-11-19 NOTE — MAU Note (Signed)
Pt presents with complaints of dizziness that has been on and off since her hysterectomy on April the 7th. States she is really weak and the dizziness got worse this morning.

## 2012-11-19 NOTE — MAU Provider Note (Signed)
First Provider Initiated Contact with Patient 11/19/12 1339      Chief Complaint:  Dizziness and Fatigue   [name] is  47 y.o. G3P3.  Patient's last menstrual period was 10/05/2012.Marland Kitchen  She had a LAVH 10/23/12 ( for pelvic pain, fibroids, and adenomyosis) that was uncomplicated. She presents complaining of feeling dizzy off and on since her surgery, along with being tired and weak.  She had not taken any pain meds in 4 days.  Past Medical History  Diagnosis Date  . ACUTE NASOPHARYNGITIS 05/07/2010  . BACK PAIN 02/05/2010  . CELLULITIS 01/13/2010  . CHEST PAIN UNSPECIFIED 08/23/2008  . CONSTIPATION, CHRONIC 04/09/2007  . DEEP VENOUS THROMBOPHLEBITIS, LEFT, LEG, HX OF 04/09/2007  . 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 DIZZINESS 08/15/2008  . DYSPNEA 05/20/2008  . FATIGUE 05/20/2008  . KNEE PAIN, LEFT 09/11/2007  . MIGRAINE HEADACHE 04/09/2007  . Palpitations 05/20/2008  . RASH-NONVESICULAR 01/17/2008  . RUQ PAIN 02/23/2010  . SICKLE CELL TRAIT 04/09/2007  . Tension headache 04/09/2007  . VARICOSE VEINS LOWER EXTREMITIES W/OTH COMPS 09/11/2007  . Allergic rhinitis, cause unspecified 04/19/2011  . Left sided sciatica 04/19/2011  . Anemia     History of anemia  . PREMATURE VENTRICULAR CONTRACTIONS 08/23/2008  . PONV (postoperative nausea and vomiting)     Past Surgical History  Procedure Laterality Date  . Tonsillectomy    . Cesarean section    . Tubal ligation    . Laparoscopic assisted vaginal hysterectomy N/A 10/23/2012    Procedure: LAPAROSCOPIC ASSISTED VAGINAL HYSTERECTOMY;  Surgeon: Meriel Pica, MD;  Location: [facility];  Service: Gynecology;  Laterality: N/A;  . Laparoscopic hysterectomy      Family History  Problem Relation Age of Onset  . Heart disease Mother   . Hypertension Mother   . Cancer Other     brain, lung and liver cancer    History  Substance Use Topics  . Smoking status: Never Smoker   . Smokeless tobacco: Never Used  . Alcohol Use: Yes   Comment: socially    Allergies: No Known Allergies  Prescriptions prior to admission  Medication Sig Dispense Refill  . enoxaparin (LOVENOX) 40 MG/0.4ML injection Inject 0.4 mLs (40 mg total) into the skin daily. Once daily X 30 days  30 Syringe  30  . ibuprofen (ADVIL,MOTRIN) 200 MG tablet Take 400 mg by mouth daily as needed for headache.      . oxyCODONE-acetaminophen (PERCOCET/ROXICET) 5-325 MG per tablet Take 1-2 tablets by mouth every 4 (four) hours as needed.  30 tablet  0     Review of Systems  Review of Systems  Constitutional: Negative for fever, chills, weight loss, malaise/fatigue and diaphoresis.  HENT: Negative for hearing loss, ear pain, nosebleeds, congestion, sore throat, neck pain, tinnitus and ear discharge.   Eyes: Negative for double vision,photophobia, pain, discharge and redness. Positive for occ blurry vision Respiratory: Negative for cough, hemoptysis, sputum production, shortness of breath, wheezing and stridor.   Cardiovascular: Negative for chest pain, palpitations, orthopnea,  leg swelling  Gastrointestinal: Negative for abdominal pain heartburn, nausea, vomiting, diarrhea, constipation, blood in stool Genitourinary: Negative for dysuria, urgency, frequency, hematuria and flank pain.  Musculoskeletal: Negative for myalgias, back pain, joint pain and falls.  Skin: Negative for itching and rash.  Neurological: Negative for  tingling, tremors, sensory change, speech change, focal weakness, seizures, loss of consciousness, weakness and headaches. Positive for dizziness Endo/Heme/Allergies: Negative for environmental allergies and polydipsia. Does not bruise/bleed easily.  Psychiatric/Behavioral: Negative for depression, suicidal ideas, hallucinations, memory loss   Physical Exam   Blood pressure 125/77, pulse 77, temperature 97.7 F (36.5 C), temperature source Oral, resp. rate 18, last menstrual period 10/05/2012.  General: General appearance - alert, well  appearing, and in no distress HEENT:  PERRL Chest - clear to auscultation, no wheezes, rales or rhonchi, symmetric air entry Heart - normal rate and regular rhythm Abdomen - soft, nontender, nondistended, no masses or organomegaly Pelvic - examination not indicated Extremities - no pedal edema noted   Labs: Results for orders placed during the hospital encounter of 11/19/12 (from the past 24 hour(s))  CBC   Collection Time    11/19/12  1:01 PM      Result Value Range   WBC 4.2  4.0 - 10.5 K/uL   RBC 4.97  3.87 - 5.11 MIL/uL   Hemoglobin 11.7 (*) 12.0 - 15.0 g/dL   HCT 16.1 (*) 09.6 - 04.5 %   MCV 71.8 (*) 78.0 - 100.0 fL   MCH 23.5 (*) 26.0 - 34.0 pg   MCHC 32.8  30.0 - 36.0 g/dL   RDW 40.9 (*) 81.1 - 91.4 %   Platelets 302  150 - 400 K/uL  COMPREHENSIVE METABOLIC PANEL   Collection Time    11/19/12  1:01 PM      Result Value Range   Sodium 137  135 - 145 mEq/L   Potassium 3.7  3.5 - 5.1 mEq/L   Chloride 101  96 - 112 mEq/L   CO2 24  19 - 32 mEq/L   Glucose, Bld 88  70 - 99 mg/dL   BUN 6  6 - 23 mg/dL   Creatinine, Ser 7.82  0.50 - 1.10 mg/dL   Calcium 95.6  8.4 - 21.3 mg/dL   Total Protein 8.2  6.0 - 8.3 g/dL   Albumin 4.6  3.5 - 5.2 g/dL   AST 23  0 - 37 U/L   ALT 17  0 - 35 U/L   Alkaline Phosphatase 50  39 - 117 U/L   Total Bilirubin 0.5  0.3 - 1.2 mg/dL   GFR calc non Af Amer 77 (*) >90 mL/min   GFR calc Af Amer 89 (*) >90 mL/min   Imaging Studies:  No results found.   Assessment: Patient Active Problem List   Diagnosis Date Noted  . Adenomyosis 10/24/2012  . Facial weakness 10/04/2012  . Tongue fasciculation 10/04/2012  . Disturbance of skin sensation 10/04/2011  . Tremor 10/04/2011  . Unspecified disorder of urethra and urinary tract 10/04/2011  . Allergic rhinitis, cause unspecified 04/19/2011  . Left sided sciatica 04/19/2011  . Left facial pain 01/13/2011  . Preventative health care 01/13/2011  . 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  . BACK PAIN  02/05/2010  . PREMATURE VENTRICULAR CONTRACTIONS 08/23/2008  . CHEST PAIN UNSPECIFIED 08/23/2008  . DIZZINESS 08/15/2008  . FATIGUE 05/20/2008  . Palpitations 05/20/2008  . DYSPNEA 05/20/2008  . TONSILLITIS, CHRONIC 03/13/2008  . RASH-NONVESICULAR 01/17/2008  . KNEE PAIN, LEFT 09/11/2007  . 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 SICKLE CELL TRAIT 04/09/2007  . Tension headache 04/09/2007  . MIGRAINE HEADACHE 04/09/2007  . CONSTIPATION, CHRONIC 04/09/2007  . DEEP VENOUS THROMBOPHLEBITIS, LEFT, LEG, HX OF 04/09/2007  . ANXIETY 03/07/2007   Vertigo Plan: Discussed with Dr. Rana Snare. Will try pt on antivert and orders to f/u with Dr. Marcelle Overlie PRN CRESENZO-DISHMAN,Damisha Wolff

## 2013-05-24 ENCOUNTER — Other Ambulatory Visit: Payer: Self-pay

## 2013-08-23 ENCOUNTER — Encounter: Payer: Self-pay | Admitting: Internal Medicine

## 2013-08-23 ENCOUNTER — Ambulatory Visit (INDEPENDENT_AMBULATORY_CARE_PROVIDER_SITE_OTHER): Payer: 59 | Admitting: Internal Medicine

## 2013-08-23 VITALS — BP 132/78 | HR 91 | Temp 97.4°F | Ht 65.0 in | Wt 157.5 lb

## 2013-08-23 DIAGNOSIS — H6691 Otitis media, unspecified, right ear: Secondary | ICD-10-CM | POA: Insufficient documentation

## 2013-08-23 DIAGNOSIS — H6690 Otitis media, unspecified, unspecified ear: Secondary | ICD-10-CM

## 2013-08-23 MED ORDER — HYDROCODONE-HOMATROPINE 5-1.5 MG/5ML PO SYRP: 5.0000 mL | ORAL_SOLUTION | Freq: Four times a day (QID) | ORAL | Status: DC | PRN

## 2013-08-23 MED ORDER — LEVOFLOXACIN 250 MG PO TABS: 250.0000 mg | ORAL_TABLET | Freq: Every day | ORAL | Status: DC

## 2013-08-23 NOTE — Progress Notes (Signed)
Pre-visit discussion using our clinic review tool. No additional management support is needed unless otherwise documented below in the visit note.  

## 2013-08-23 NOTE — Patient Instructions (Signed)
Please take all new medication as prescribed - the antibiotic, and the cough mediciine Please continue all other medications as before, and refills have been done if requested. Please have the pharmacy call with any other refills you may need.  You can also take Mucinex (or it's generic off brand) for congestion, and tylenol or advil as needed for pain.

## 2013-08-23 NOTE — Assessment & Plan Note (Signed)
Mild to mod, for antibx course,  to f/u any worsening symptoms or concerns 

## 2013-08-23 NOTE — Progress Notes (Signed)
Subjective:    Patient ID: [name], female    DOB: [birth year], 48 y.o.   MRN: [id number]  HPI  Here with acute onset mild to mod 2 wks severe right ear ahce, ST, HA, general weakness and malaise, with nonprod cough, but Pt denies chest pain, increased sob or doe, wheezing, orthopnea, PND, increased LE swelling, palpitations, dizziness or syncope. Past Medical History  Diagnosis Date  . ACUTE NASOPHARYNGITIS 05/07/2010  . BACK PAIN 02/05/2010  . CELLULITIS 01/13/2010  . CHEST PAIN UNSPECIFIED 08/23/2008  . CONSTIPATION, CHRONIC 04/09/2007  . DEEP VENOUS THROMBOPHLEBITIS, LEFT, LEG, HX OF 04/09/2007  . [place] DISEASE, 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  . DIZZINESS 08/15/2008  . DYSPNEA 05/20/2008  . FATIGUE 05/20/2008  . KNEE PAIN, LEFT 09/11/2007  . MIGRAINE HEADACHE 04/09/2007  . Palpitations 05/20/2008  . RASH-NONVESICULAR 01/17/2008  . RUQ PAIN 02/23/2010  . SICKLE CELL TRAIT 04/09/2007  . Tension headache 04/09/2007  . VARICOSE VEINS LOWER EXTREMITIES W/OTH COMPS 09/11/2007  . Allergic rhinitis, cause unspecified 04/19/2011  . Left sided sciatica 04/19/2011  . Anemia     History of anemia  . PREMATURE VENTRICULAR CONTRACTIONS 08/23/2008  . PONV (postoperative nausea and vomiting)    Past Surgical History  Procedure Laterality Date  . Tonsillectomy    . Cesarean section    . Tubal ligation    . Laparoscopic assisted vaginal hysterectomy N/A 10/23/2012    Procedure: LAPAROSCOPIC ASSISTED VAGINAL HYSTERECTOMY;  Surgeon: Margarette Asal, MD;  Location: [facility];  Service: Gynecology;  Laterality: N/A;  . Laparoscopic hysterectomy      reports that she has never smoked. She has never used smokeless tobacco. She reports that she drinks alcohol. She reports that she does not use illicit drugs. family history includes Cancer in her other; Heart disease in her mother; Hypertension in her mother. No Known Allergies No current outpatient prescriptions on file prior to visit.   No current  facility-administered medications on file prior to visit.   Review of Systems  Constitutional: Negative for unexpected weight change, or unusual diaphoresis  HENT: Negative for tinnitus.   Eyes: Negative for photophobia and visual disturbance.  Respiratory: Negative for choking and stridor.   Gastrointestinal: Negative for vomiting and blood in stool.  Genitourinary: Negative for hematuria and decreased urine volume.  Musculoskeletal: Negative for acute joint swelling Skin: Negative for color change and wound.  Neurological: Negative for tremors and numbness other than noted  Psychiatric/Behavioral: Negative for decreased concentration or  hyperactivity.       Objective:   Physical Exam BP 132/78  Pulse 91  Temp(Src) 97.4 F (36.3 C) (Oral)  Ht 5\' 5"  (1.651 m)  Wt 157 lb 8 oz (71.442 kg)  BMI 26.21 kg/m2  SpO2 96%  LMP 10/05/2012 VS noted, mild ill Constitutional: Pt appears well-developed and well-nourished.  HENT: Head: NCAT.  Right Ear: External ear normal.  Left Ear: External ear normal.  Bilat tm's with mild erythema.  Max sinus areas  tender.  Pharynx with mild erythema, no exudate Eyes: Conjunctivae and EOM are normal. Pupils are equal, round, and reactive to light.  Neck: Normal range of motion. Neck supple.  Cardiovascular: Normal rate and regular rhythm.   Pulmonary/Chest: Effort normal and breath sounds normal.  Abd:  Soft, NT, non-distended, + BS Neurological: Pt is alert. Not confused  Skin: Skin is warm. No erythema.  Psychiatric: Pt behavior is normal. Thought content normal.         Assessment &  Plan:

## 2013-12-18 ENCOUNTER — Encounter (HOSPITAL_BASED_OUTPATIENT_CLINIC_OR_DEPARTMENT_OTHER): Payer: Self-pay | Admitting: Emergency Medicine

## 2013-12-18 DIAGNOSIS — Z792 Long term (current) use of antibiotics: Secondary | ICD-10-CM | POA: Insufficient documentation

## 2013-12-18 DIAGNOSIS — Z8679 Personal history of other diseases of the circulatory system: Secondary | ICD-10-CM | POA: Insufficient documentation

## 2013-12-18 DIAGNOSIS — Z8709 Personal history of other diseases of the respiratory system: Secondary | ICD-10-CM | POA: Insufficient documentation

## 2013-12-18 DIAGNOSIS — Z9889 Other specified postprocedural states: Secondary | ICD-10-CM | POA: Insufficient documentation

## 2013-12-18 DIAGNOSIS — Z8739 Personal history of other diseases of the musculoskeletal system and connective tissue: Secondary | ICD-10-CM | POA: Insufficient documentation

## 2013-12-18 DIAGNOSIS — Z862 Personal history of diseases of the blood and blood-forming organs and certain disorders involving the immune mechanism: Secondary | ICD-10-CM | POA: Insufficient documentation

## 2013-12-18 DIAGNOSIS — Z8719 Personal history of other diseases of the digestive system: Secondary | ICD-10-CM | POA: Insufficient documentation

## 2013-12-18 DIAGNOSIS — Z86718 Personal history of other venous thrombosis and embolism: Secondary | ICD-10-CM | POA: Insufficient documentation

## 2013-12-18 DIAGNOSIS — R1011 Right upper quadrant pain: Secondary | ICD-10-CM | POA: Insufficient documentation

## 2013-12-18 DIAGNOSIS — Z90710 Acquired absence of both cervix and uterus: Secondary | ICD-10-CM | POA: Insufficient documentation

## 2013-12-18 DIAGNOSIS — Z9851 Tubal ligation status: Secondary | ICD-10-CM | POA: Insufficient documentation

## 2013-12-18 LAB — URINALYSIS, ROUTINE W REFLEX MICROSCOPIC
Bilirubin Urine: NEGATIVE
GLUCOSE, UA: NEGATIVE mg/dL
HGB URINE DIPSTICK: NEGATIVE
Ketones, ur: NEGATIVE mg/dL
LEUKOCYTES UA: NEGATIVE
Nitrite: NEGATIVE
PROTEIN: NEGATIVE mg/dL
SPECIFIC GRAVITY, URINE: 1.014 (ref 1.005–1.030)
Urobilinogen, UA: 0.2 mg/dL (ref 0.0–1.0)
pH: 5.5 (ref 5.0–8.0)

## 2013-12-18 NOTE — ED Notes (Addendum)
abd pain-umbilicus to RLQ x 3 days-nausea-no vomiting or fever

## 2013-12-19 ENCOUNTER — Ambulatory Visit (HOSPITAL_BASED_OUTPATIENT_CLINIC_OR_DEPARTMENT_OTHER)
Admit: 2013-12-19 | Discharge: 2013-12-19 | Disposition: A | Discharge status: Home / Self Care | Payer: 59 | Attending: Emergency Medicine | Admitting: Emergency Medicine

## 2013-12-19 ENCOUNTER — Emergency Department (HOSPITAL_BASED_OUTPATIENT_CLINIC_OR_DEPARTMENT_OTHER)
Admission: EM | Admit: 2013-12-19 | Discharge: 2013-12-19 | Disposition: A | Discharge status: Home / Self Care | Payer: 59 | Attending: Emergency Medicine | Admitting: Emergency Medicine

## 2013-12-19 DIAGNOSIS — R110 Nausea: Secondary | ICD-10-CM | POA: Insufficient documentation

## 2013-12-19 DIAGNOSIS — R1011 Right upper quadrant pain: Secondary | ICD-10-CM | POA: Insufficient documentation

## 2013-12-19 LAB — CBC WITH DIFFERENTIAL/PLATELET
Basophils Absolute: 0 10*3/uL (ref 0.0–0.1)
Basophils Relative: 1 % (ref 0–1)
EOS ABS: 0.5 10*3/uL (ref 0.0–0.7)
EOS PCT: 11 % — AB (ref 0–5)
HCT: 42.7 % (ref 36.0–46.0)
HEMOGLOBIN: 15.1 g/dL — AB (ref 12.0–15.0)
LYMPHS ABS: 1.5 10*3/uL (ref 0.7–4.0)
Lymphocytes Relative: 33 % (ref 12–46)
MCH: 29 pg (ref 26.0–34.0)
MCHC: 35.4 g/dL (ref 30.0–36.0)
MCV: 82 fL (ref 78.0–100.0)
MONO ABS: 0.3 10*3/uL (ref 0.1–1.0)
MONOS PCT: 7 % (ref 3–12)
Neutro Abs: 2.2 10*3/uL (ref 1.7–7.7)
Neutrophils Relative %: 48 % (ref 43–77)
Platelets: 248 10*3/uL (ref 150–400)
RBC: 5.21 MIL/uL — AB (ref 3.87–5.11)
RDW: 13.7 % (ref 11.5–15.5)
WBC: 4.6 10*3/uL (ref 4.0–10.5)

## 2013-12-19 LAB — COMPREHENSIVE METABOLIC PANEL
ALK PHOS: 54 U/L (ref 39–117)
ALT: 15 U/L (ref 0–35)
AST: 20 U/L (ref 0–37)
Albumin: 4.8 g/dL (ref 3.5–5.2)
BUN: 19 mg/dL (ref 6–23)
CALCIUM: 10 mg/dL (ref 8.4–10.5)
CO2: 26 meq/L (ref 19–32)
Chloride: 104 mEq/L (ref 96–112)
Creatinine, Ser: 0.9 mg/dL (ref 0.50–1.10)
GFR calc Af Amer: 88 mL/min — ABNORMAL LOW (ref >90)
GFR calc non Af Amer: 76 mL/min — ABNORMAL LOW (ref >90)
Glucose, Bld: 89 mg/dL (ref 70–99)
POTASSIUM: 4.2 meq/L (ref 3.7–5.3)
SODIUM: 142 meq/L (ref 137–147)
TOTAL PROTEIN: 8.5 g/dL — AB (ref 6.0–8.3)
Total Bilirubin: 0.2 mg/dL — ABNORMAL LOW (ref 0.3–1.2)

## 2013-12-19 LAB — LIPASE, BLOOD: Lipase: 57 U/L (ref 11–59)

## 2013-12-19 MED ORDER — FENTANYL CITRATE 0.05 MG/ML IJ SOLN
100.0000 ug | Freq: Once | INTRAMUSCULAR | Status: AC
  Administered 2013-12-19: 100 ug via INTRAVENOUS
  Filled 2013-12-19: qty 2

## 2013-12-19 MED ORDER — ONDANSETRON 4 MG PO TBDP: 4.0000 mg | ORAL_TABLET | Freq: Three times a day (TID) | ORAL | Status: DC | PRN

## 2013-12-19 MED ORDER — HYDROCODONE-ACETAMINOPHEN 5-325 MG PO TABS: 1.0000 | ORAL_TABLET | Freq: Four times a day (QID) | ORAL | Status: DC | PRN

## 2013-12-19 MED ORDER — ONDANSETRON HCL 4 MG/2ML IJ SOLN
4.0000 mg | Freq: Once | INTRAMUSCULAR | Status: AC
  Administered 2013-12-19: 4 mg via INTRAVENOUS
  Filled 2013-12-19: qty 2

## 2013-12-19 NOTE — ED Notes (Signed)
MD at bedside. 

## 2013-12-19 NOTE — Discharge Instructions (Signed)
Abdominal Pain, Adult °Many things can cause abdominal pain. Usually, abdominal pain is not caused by a disease and will improve without treatment. It can often be observed and treated at home. Your health care provider will do a physical exam and possibly order blood tests and X-rays to help determine the seriousness of your pain. However, in many cases, more time must pass before a clear cause of the pain can be found. Before that point, your health care provider may not know if you need more testing or further treatment. °HOME CARE INSTRUCTIONS  °Monitor your abdominal pain for any changes. The following actions may help to alleviate any discomfort you are experiencing: °· Only take over-the-counter or prescription medicines as directed by your health care provider. °· Do not take laxatives unless directed to do so by your health care provider. °· Try a clear liquid diet (broth, tea, or water) as directed by your health care provider. Slowly move to a bland diet as tolerated. °SEEK MEDICAL CARE IF: °· You have unexplained abdominal pain. °· You have abdominal pain associated with nausea or diarrhea. °· You have pain when you urinate or have a bowel movement. °· You experience abdominal pain that wakes you in the night. °· You have abdominal pain that is worsened or improved by eating food. °· You have abdominal pain that is worsened with eating fatty foods. °SEEK IMMEDIATE MEDICAL CARE IF:  °· Your pain does not go away within 2 hours. °· You have a fever. °· You keep throwing up (vomiting). °· Your pain is felt only in portions of the abdomen, such as the right side or the left lower portion of the abdomen. °· You pass bloody or black tarry stools. °MAKE SURE YOU: °· Understand these instructions.   °· Will watch your condition.   °· Will get help right away if you are not doing well or get worse.   °Document Released: 04/14/2005 Document Revised: 04/25/2013 Document Reviewed: 03/14/2013 °[employer]® Patient  Information ©2014 [employer]. ° °

## 2013-12-19 NOTE — ED Notes (Signed)
Pt called on 12/19/13 at 12:27 pm and informed that her [place] results were negative and to follow-up with her PCP if she continued to have trouble.

## 2013-12-19 NOTE — ED Provider Notes (Signed)
CSN: [id number]     Arrival date & time 12/18/13  2211 History   First MD Initiated Contact with Patient 12/19/13 0043     Chief Complaint  Patient presents with  . Abdominal Pain     (Consider location/radiation/quality/duration/timing/severity/associated sxs/prior Treatment) HPI This is a 48 year old female with a three-day history of intermittent right upper quadrant abdominal pain. The pain is described as sharp and at times severe enough to have her bent over. It is mild at the present time. It is not worsened or improved with eating but is worse with movement and palpation. It has been associated with nausea but no vomiting or diarrhea.  Past Medical History  Diagnosis Date  . ACUTE NASOPHARYNGITIS 05/07/2010  . BACK PAIN 02/05/2010  . CELLULITIS 01/13/2010  . CHEST PAIN UNSPECIFIED 08/23/2008  . CONSTIPATION, CHRONIC 04/09/2007  . DEEP VENOUS THROMBOPHLEBITIS, LEFT, LEG, HX OF 04/09/2007  . [place] DISEASE, 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  . DIZZINESS 08/15/2008  . DYSPNEA 05/20/2008  . FATIGUE 05/20/2008  . KNEE PAIN, LEFT 09/11/2007  . MIGRAINE HEADACHE 04/09/2007  . Palpitations 05/20/2008  . RASH-NONVESICULAR 01/17/2008  . RUQ PAIN 02/23/2010  . SICKLE CELL TRAIT 04/09/2007  . Tension headache 04/09/2007  . VARICOSE VEINS LOWER EXTREMITIES W/OTH COMPS 09/11/2007  . Allergic rhinitis, cause unspecified 04/19/2011  . Left sided sciatica 04/19/2011  . Anemia     History of anemia  . PREMATURE VENTRICULAR CONTRACTIONS 08/23/2008  . PONV (postoperative nausea and vomiting)    Past Surgical History  Procedure Laterality Date  . Tonsillectomy    . Cesarean section    . Tubal ligation    . Laparoscopic assisted vaginal hysterectomy N/A 10/23/2012    Procedure: LAPAROSCOPIC ASSISTED VAGINAL HYSTERECTOMY;  Surgeon: Margarette Asal, MD;  Location: [facility];  Service: Gynecology;  Laterality: N/A;  . Laparoscopic hysterectomy    . Abdominal hysterectomy     Family History  Problem Relation  Age of Onset  . Heart disease Mother   . Hypertension Mother   . Cancer Other     brain, lung and liver cancer   History  Substance Use Topics  . Smoking status: Never Smoker   . Smokeless tobacco: Never Used  . Alcohol Use: Yes     Comment: socially   OB History   Grav Para Term Preterm Abortions TAB SAB Ect Mult Living   3 3        3      Review of Systems  All other systems reviewed and are negative.   Allergies  Review of patient's allergies indicates no known allergies.  Home Medications   Prior to Admission medications   Medication Sig Start Date End Date Taking? Authorizing Provider  HYDROcodone-homatropine (HYCODAN) 5-1.5 MG/5ML syrup Take 5 mLs by mouth every 6 (six) hours as needed for cough. 08/23/13   Biagio Borg, MD  levofloxacin (LEVAQUIN) 250 MG tablet Take 1 tablet (250 mg total) by mouth daily. 08/23/13   Biagio Borg, MD   BP 124/64  Pulse 74  Temp(Src) 98.1 F (36.7 C) (Oral)  Resp 20  Ht 5\' 5"  (1.651 m)  Wt 152 lb (68.947 kg)  BMI 25.29 kg/m2  SpO2 100%  LMP 10/05/2012  Physical Exam General: Well-developed, well-nourished female in no acute distress; appearance consistent with age of record HENT: normocephalic; atraumatic Eyes: pupils equal, round and reactive to light; extraocular muscles intact Neck: supple Heart: regular rate and rhythm; no murmurs, rubs or gallops Lungs: clear to auscultation  bilaterally Abdomen: soft; nondistended; right upper quadrant tenderness; no masses or hepatosplenomegaly; bowel sounds present; no gallstones seen on bedside ultrasound Extremities: No deformity; full range of motion; pulses normal Neurologic: Awake, alert and oriented; motor function intact in all extremities and symmetric; no facial droop Skin: Warm and dry Psychiatric: Normal mood and affect    ED Course  Procedures (including critical care time)  MDM   Nursing notes and vitals signs, including pulse oximetry, reviewed.  Summary of this  visit's results, reviewed by myself:  Labs:  Results for orders placed during the hospital encounter of 12/19/13 (from the past 24 hour(s))  URINALYSIS, ROUTINE W REFLEX MICROSCOPIC     Status: None   Collection Time    12/18/13 10:25 PM      Result Value Ref Range   Color, Urine YELLOW  YELLOW   APPearance CLEAR  CLEAR   Specific Gravity, Urine 1.014  1.005 - 1.030   pH 5.5  5.0 - 8.0   Glucose, UA NEGATIVE  NEGATIVE mg/dL   Hgb urine dipstick NEGATIVE  NEGATIVE   Bilirubin Urine NEGATIVE  NEGATIVE   Ketones, ur NEGATIVE  NEGATIVE mg/dL   Protein, ur NEGATIVE  NEGATIVE mg/dL   Urobilinogen, UA 0.2  0.0 - 1.0 mg/dL   Nitrite NEGATIVE  NEGATIVE   Leukocytes, UA NEGATIVE  NEGATIVE  COMPREHENSIVE METABOLIC PANEL     Status: Abnormal   Collection Time    12/19/13  1:20 AM      Result Value Ref Range   Sodium 142  137 - 147 mEq/L   Potassium 4.2  3.7 - 5.3 mEq/L   Chloride 104  96 - 112 mEq/L   CO2 26  19 - 32 mEq/L   Glucose, Bld 89  70 - 99 mg/dL   BUN 19  6 - 23 mg/dL   Creatinine, Ser 0.90  0.50 - 1.10 mg/dL   Calcium 10.0  8.4 - 10.5 mg/dL   Total Protein 8.5 (*) 6.0 - 8.3 g/dL   Albumin 4.8  3.5 - 5.2 g/dL   AST 20  0 - 37 U/L   ALT 15  0 - 35 U/L   Alkaline Phosphatase 54  39 - 117 U/L   Total Bilirubin 0.2 (*) 0.3 - 1.2 mg/dL   GFR calc non Af Amer 76 (*) >90 mL/min   GFR calc Af Amer 88 (*) >90 mL/min  LIPASE, BLOOD     Status: None   Collection Time    12/19/13  1:20 AM      Result Value Ref Range   Lipase 57  11 - 59 U/L  CBC WITH DIFFERENTIAL     Status: Abnormal   Collection Time    12/19/13  1:20 AM      Result Value Ref Range   WBC 4.6  4.0 - 10.5 K/uL   RBC 5.21 (*) 3.87 - 5.11 MIL/uL   Hemoglobin 15.1 (*) 12.0 - 15.0 g/dL   HCT 42.7  36.0 - 46.0 %   MCV 82.0  78.0 - 100.0 fL   MCH 29.0  26.0 - 34.0 pg   MCHC 35.4  30.0 - 36.0 g/dL   RDW 13.7  11.5 - 15.5 %   Platelets 248  150 - 400 K/uL   Neutrophils Relative % 48  43 - 77 %   Neutro Abs 2.2   1.7 - 7.7 K/uL   Lymphocytes Relative 33  12 - 46 %   Lymphs Abs 1.5  0.7 - 4.0 K/uL   Monocytes Relative 7  3 - 12 %   Monocytes Absolute 0.3  0.1 - 1.0 K/uL   Eosinophils Relative 11 (*) 0 - 5 %   Eosinophils Absolute 0.5  0.0 - 0.7 K/uL   Basophils Relative 1  0 - 1 %   Basophils Absolute 0.0  0.0 - 0.1 K/uL   2:01 AM The patient's pain is now about a 4/10. She is still tender in the right upper quadrant. She was advised of her normal laboratory studies. Her symptoms and exam are most consistent with gallbladder pain and we will schedule her for a formal ultrasound later today.    Wynetta Fines, MD 12/19/13 [phone]

## 2013-12-26 ENCOUNTER — Ambulatory Visit: Payer: 59 | Admitting: Internal Medicine

## 2013-12-27 ENCOUNTER — Ambulatory Visit (INDEPENDENT_AMBULATORY_CARE_PROVIDER_SITE_OTHER): Payer: 59 | Admitting: Internal Medicine

## 2013-12-27 ENCOUNTER — Encounter: Payer: Self-pay | Admitting: Internal Medicine

## 2013-12-27 VITALS — BP 112/72 | HR 79 | Temp 98.1°F | Ht 65.0 in | Wt 164.0 lb

## 2013-12-27 DIAGNOSIS — R102 Pelvic and perineal pain: Secondary | ICD-10-CM | POA: Insufficient documentation

## 2013-12-27 DIAGNOSIS — F411 Generalized anxiety disorder: Secondary | ICD-10-CM

## 2013-12-27 DIAGNOSIS — R1031 Right lower quadrant pain: Secondary | ICD-10-CM

## 2013-12-27 DIAGNOSIS — K5909 Other constipation: Secondary | ICD-10-CM

## 2013-12-27 NOTE — Patient Instructions (Signed)
Please continue all other medications as before, and refills have been done if requested.  Please have the pharmacy call with any other refills you may need.  You will be contacted regarding the referral for: pelvic ultrasound, and GYN referral  Please keep your appointments with your specialists as you may have planned

## 2013-12-27 NOTE — Assessment & Plan Note (Signed)
stable overall by history and exam, and pt to continue medical treatment as before,  to f/u any worsening symptoms or concerns 

## 2013-12-27 NOTE — Assessment & Plan Note (Signed)
Unlikely without fever, worsening pain or less appetite, and normal recent wbc to be appendix, etiollogy unclear, cant r/o ovarian etiology - for pelvic u/s, refer GYN, consider GI referral to consider colonoscopy but seems less likely issue with no specific symtpoms except for crampy pain

## 2013-12-27 NOTE — Assessment & Plan Note (Signed)
Doubt signficant element in this case,  to f/u any worsening symptoms or concerns

## 2013-12-27 NOTE — Progress Notes (Signed)
Subjective:    Patient ID: [name], female    DOB: 28-Mar-1966, 48 y.o.   MRN: [id number]  HPI  Here after an episode of right side crampy pain, seen in ER June 3, still with intermittent pain since then less freq , less severe but persists, without bowel change and Denies worsening reflux,other abd pain, dysphagia, n/v, bowel change, radiation, back pain or blood.  Has hx of ovary cyst, s/p TAH.  Denies urinary symptoms such as dysuria, frequency, urgency, flank pain, hematuria or n/v, fever, chills.Recent abd u/s neg for acute. Denies worsening depressive symptoms, suicidal ideation, or panic; has ongoing anxiety, not increased recently. Does have hx of chronic constipation but denies recent Past Medical History  Diagnosis Date  . ACUTE NASOPHARYNGITIS 05/07/2010  . BACK PAIN 02/05/2010  . CELLULITIS 01/13/2010  . CHEST PAIN UNSPECIFIED 08/23/2008  . CONSTIPATION, CHRONIC 04/09/2007  . DEEP VENOUS THROMBOPHLEBITIS, LEFT, LEG, HX OF 04/09/2007  . [place] DISEASE, 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  . DIZZINESS 08/15/2008  . DYSPNEA 05/20/2008  . FATIGUE 05/20/2008  . KNEE PAIN, LEFT 09/11/2007  . MIGRAINE HEADACHE 04/09/2007  . Palpitations 05/20/2008  . RASH-NONVESICULAR 01/17/2008  . RUQ PAIN 02/23/2010  . SICKLE CELL TRAIT 04/09/2007  . Tension headache 04/09/2007  . VARICOSE VEINS LOWER EXTREMITIES W/OTH COMPS 09/11/2007  . Allergic rhinitis, cause unspecified 04/19/2011  . Left sided sciatica 04/19/2011  . Anemia     History of anemia  . PREMATURE VENTRICULAR CONTRACTIONS 08/23/2008  . PONV (postoperative nausea and vomiting)    Past Surgical History  Procedure Laterality Date  . Tonsillectomy    . Cesarean section    . Tubal ligation    . Laparoscopic assisted vaginal hysterectomy N/A 10/23/2012    Procedure: LAPAROSCOPIC ASSISTED VAGINAL HYSTERECTOMY;  Surgeon: Margarette Asal, MD;  Location: [facility];  Service: Gynecology;  Laterality: N/A;  . Laparoscopic hysterectomy    . Abdominal  hysterectomy      reports that she has never smoked. She has never used smokeless tobacco. She reports that she drinks alcohol. She reports that she does not use illicit drugs. family history includes Cancer in her other; Heart disease in her mother; Hypertension in her mother. No Known Allergies Current Outpatient Prescriptions on File Prior to Visit  Medication Sig Dispense Refill  . HYDROcodone-acetaminophen (NORCO) 5-325 MG per tablet Take 1-2 tablets by mouth every 6 (six) hours as needed.  10 tablet  0  . ondansetron (ZOFRAN ODT) 4 MG disintegrating tablet Take 1 tablet (4 mg total) by mouth every 8 (eight) hours as needed for nausea or vomiting.  10 tablet  0   No current facility-administered medications on file prior to visit.   Review of Systems  Constitutional: Negative for unusual diaphoresis or other sweats  HENT: Negative for ringing in ear Eyes: Negative for double vision or worsening visual disturbance.  Respiratory: Negative for choking and stridor.   Gastrointestinal: Negative for vomiting or other signifcant bowel change Genitourinary: Negative for hematuria or decreased urine volume.  Musculoskeletal: Negative for other MSK pain or swelling Skin: Negative for color change and worsening wound.  Neurological: Negative for tremors and numbness other than noted  Psychiatric/Behavioral: Negative for decreased concentration or agitation other than above       Objective:   Physical Exam BP 112/72  Pulse 79  Temp(Src) 98.1 F (36.7 C) (Oral)  Ht 5\' 5"  (1.651 m)  Wt 164 lb (74.39 kg)  BMI 27.29 kg/m2  SpO2 97%  LMP 10/05/2012 VS noted,  Constitutional: Pt appears well-developed, well-nourished.  HENT: Head: NCAT.  Right Ear: External ear normal.  Left Ear: External ear normal.  Eyes: . Pupils are equal, round, and reactive to light. Conjunctivae and EOM are normal Neck: Normal range of motion. Neck supple.  Cardiovascular: Normal rate and regular rhythm.     Pulmonary/Chest: Effort normal and breath sounds normal.  Abd:  Soft, ND, + BS with mild tender RLQ, no guarding, no rebound Neurological: Pt is alert. Not confused , motor grossly intact Skin: Skin is warm. No rash Psychiatric: Pt behavior is normal. No agitation.      Assessment & Plan:

## 2013-12-27 NOTE — Progress Notes (Signed)
Pre visit review using our clinic review tool, if applicable. No additional management support is needed unless otherwise documented below in the visit note. 

## 2013-12-28 ENCOUNTER — Other Ambulatory Visit: Payer: Self-pay | Admitting: Internal Medicine

## 2013-12-28 DIAGNOSIS — R1031 Right lower quadrant pain: Secondary | ICD-10-CM

## 2013-12-28 DIAGNOSIS — N83209 Unspecified ovarian cyst, unspecified side: Secondary | ICD-10-CM

## 2013-12-31 ENCOUNTER — Telehealth: Payer: Self-pay | Admitting: Internal Medicine

## 2013-12-31 DIAGNOSIS — R1031 Right lower quadrant pain: Secondary | ICD-10-CM

## 2013-12-31 NOTE — Telephone Encounter (Signed)
Patient is calling to request a referral for GI. Please advise.

## 2014-01-01 ENCOUNTER — Telehealth: Payer: Self-pay | Admitting: Obstetrics and Gynecology

## 2014-01-01 NOTE — Telephone Encounter (Signed)
Trying to reach patient to schedule a NGYN doctor referral appointment.

## 2014-01-01 NOTE — Telephone Encounter (Signed)
Done, placed as "urgent" order, hopefully to see PA at next available

## 2014-01-01 NOTE — Telephone Encounter (Signed)
Called the patient left a detailed message of referral.

## 2014-01-14 ENCOUNTER — Other Ambulatory Visit (HOSPITAL_COMMUNITY): Payer: Self-pay | Admitting: Obstetrics and Gynecology

## 2014-01-14 DIAGNOSIS — R1011 Right upper quadrant pain: Secondary | ICD-10-CM

## 2014-01-15 ENCOUNTER — Encounter: Payer: Self-pay | Admitting: Internal Medicine

## 2014-01-16 ENCOUNTER — Ambulatory Visit: Payer: Self-pay | Admitting: Obstetrics and Gynecology

## 2014-01-16 ENCOUNTER — Ambulatory Visit (HOSPITAL_COMMUNITY): Payer: 59

## 2014-01-17 ENCOUNTER — Ambulatory Visit (HOSPITAL_COMMUNITY)
Admission: RE | Admit: 2014-01-17 | Discharge: 2014-01-17 | Disposition: A | Discharge status: Home / Self Care | Payer: 59 | Source: Ambulatory Visit | Attending: Obstetrics and Gynecology | Admitting: Obstetrics and Gynecology

## 2014-01-17 ENCOUNTER — Encounter (HOSPITAL_COMMUNITY): Payer: Self-pay

## 2014-01-17 DIAGNOSIS — K869 Disease of pancreas, unspecified: Secondary | ICD-10-CM | POA: Insufficient documentation

## 2014-01-17 DIAGNOSIS — R1011 Right upper quadrant pain: Secondary | ICD-10-CM | POA: Insufficient documentation

## 2014-01-17 MED ORDER — IOHEXOL 300 MG/ML  SOLN
100.0000 mL | Freq: Once | INTRAMUSCULAR | Status: AC | PRN
  Administered 2014-01-17: 100 mL via INTRAVENOUS

## 2014-01-21 ENCOUNTER — Encounter: Payer: Self-pay | Admitting: Internal Medicine

## 2014-03-20 ENCOUNTER — Ambulatory Visit: Payer: 59 | Admitting: Internal Medicine

## 2014-05-20 ENCOUNTER — Encounter (HOSPITAL_COMMUNITY): Payer: Self-pay

## 2014-05-24 ENCOUNTER — Ambulatory Visit (INDEPENDENT_AMBULATORY_CARE_PROVIDER_SITE_OTHER): Payer: 59 | Admitting: Internal Medicine

## 2014-05-24 ENCOUNTER — Other Ambulatory Visit (INDEPENDENT_AMBULATORY_CARE_PROVIDER_SITE_OTHER): Payer: 59

## 2014-05-24 ENCOUNTER — Encounter: Payer: Self-pay | Admitting: Internal Medicine

## 2014-05-24 VITALS — BP 112/82 | HR 88 | Temp 98.2°F | Resp 16 | Ht 65.0 in | Wt 166.0 lb

## 2014-05-24 DIAGNOSIS — R110 Nausea: Secondary | ICD-10-CM

## 2014-05-24 DIAGNOSIS — R42 Dizziness and giddiness: Secondary | ICD-10-CM

## 2014-05-24 DIAGNOSIS — R202 Paresthesia of skin: Principal | ICD-10-CM

## 2014-05-24 DIAGNOSIS — R200 Anesthesia of skin: Secondary | ICD-10-CM

## 2014-05-24 LAB — CBC
HEMATOCRIT: 44.8 % (ref 36.0–46.0)
Hemoglobin: 14.5 g/dL (ref 12.0–15.0)
MCHC: 32.4 g/dL (ref 30.0–36.0)
MCV: 86.6 fl (ref 78.0–100.0)
Platelets: 211 10*3/uL (ref 150.0–400.0)
RBC: 5.17 Mil/uL — ABNORMAL HIGH (ref 3.87–5.11)
RDW: 13.5 % (ref 11.5–15.5)
WBC: 6.6 10*3/uL (ref 4.0–10.5)

## 2014-05-24 LAB — COMPREHENSIVE METABOLIC PANEL
ALK PHOS: 39 U/L (ref 39–117)
ALT: 13 U/L (ref 0–35)
AST: 19 U/L (ref 0–37)
Albumin: 3.8 g/dL (ref 3.5–5.2)
BILIRUBIN TOTAL: 0.3 mg/dL (ref 0.2–1.2)
BUN: 11 mg/dL (ref 6–23)
CO2: 27 meq/L (ref 19–32)
CREATININE: 0.9 mg/dL (ref 0.4–1.2)
Calcium: 9.4 mg/dL (ref 8.4–10.5)
Chloride: 104 mEq/L (ref 96–112)
GFR: 89.92 mL/min (ref >60.00)
Glucose, Bld: 72 mg/dL (ref 70–99)
Potassium: 3.5 mEq/L (ref 3.5–5.1)
Sodium: 137 mEq/L (ref 135–145)
Total Protein: 7.7 g/dL (ref 6.0–8.3)

## 2014-05-24 LAB — LIPASE: LIPASE: 36 U/L (ref 11.0–59.0)

## 2014-05-24 MED ORDER — ONDANSETRON 4 MG PO TBDP: 4.0000 mg | ORAL_TABLET | Freq: Three times a day (TID) | ORAL | Status: DC | PRN

## 2014-05-24 MED ORDER — PANTOPRAZOLE SODIUM 40 MG PO TBEC: 40.0000 mg | DELAYED_RELEASE_TABLET | Freq: Every day | ORAL | Status: DC

## 2014-05-24 NOTE — Patient Instructions (Signed)
We have checked your heart which looks normal.   We will check your blood work today looking for any signs of infection, low blood counts, kidney problems or liver problems. We will call you back with these results.  In the meantime we would like you to take a medicine called Protonix every morning, this helps to decrease the amount of acid in the stomach and may help with your nausea. We are also sending in a medicine called Zofran which she can take as needed for nausea every 6 hours.

## 2014-05-24 NOTE — Progress Notes (Signed)
Pre visit review using our clinic review tool, if applicable. No additional management support is needed unless otherwise documented below in the visit note. 

## 2014-05-27 ENCOUNTER — Encounter: Payer: Self-pay | Admitting: Internal Medicine

## 2014-05-27 DIAGNOSIS — R110 Nausea: Secondary | ICD-10-CM | POA: Insufficient documentation

## 2014-05-27 NOTE — Progress Notes (Signed)
   Subjective:    Patient ID: [name], female    DOB: [birth year], 48 y.o.   MRN: [id number]  HPI The patient is a 48 year old female who comes in today for acute visit for nausea. She's also had occasional sharp pain in her left arm. She states that happened twice once today once yesterday. She denied any chest pain tightness or pressure during the pain. She admits to nausea however no vomiting, her appetite has been reduced no diarrhea no abdominal pain no constipation. She does have history of migraines although claims no headaches at this time and has not had one in recent times. She also has had some occasional dizziness where she feels as though she may pass out. She denies that it feels like the room is spinning.  Review of Systems  Constitutional: Positive for appetite change. Negative for fever, chills, activity change, fatigue and unexpected weight change.  Cardiovascular: Negative for chest pain, palpitations and leg swelling.  Gastrointestinal: Positive for nausea. Negative for vomiting, abdominal pain, diarrhea, constipation and abdominal distention.  Neurological: Positive for dizziness. Negative for weakness, light-headedness, numbness and headaches.       Sharp pain in left arm bicep region, 2 episodes.      Objective:   Physical Exam  Constitutional: She is oriented to person, place, and time. She appears well-developed and well-nourished.  HENT:  Head: Normocephalic and atraumatic.  Eyes: EOM are normal.  Neck: Normal range of motion.  Cardiovascular: Normal rate and regular rhythm.   Pulmonary/Chest: Effort normal and breath sounds normal.  Abdominal: Soft. Bowel sounds are normal. She exhibits no distension. There is no tenderness. There is no rebound and no guarding.  Neurological: She is alert and oriented to person, place, and time. No cranial nerve deficit. Coordination normal.  Skin: Skin is warm and dry.    EKG: Rate 81, QTC 394, intervals normal, rhythm  normal, normal axis. No acute changes or findings.  Filed Vitals:   05/24/14 1542 05/24/14 1629 05/24/14 1630  BP: 112/74 108/72 112/82  Pulse: 90 81 88  Temp: 98.2 F (36.8 C)    TempSrc: Oral    Resp: 16    Height: 5\' 5"  (1.651 m)    Weight: 166 lb (75.297 kg)    SpO2: 99%        Assessment & Plan:

## 2014-05-27 NOTE — Assessment & Plan Note (Signed)
Orthostatic vital signs negative at today's visit. Check CBC to make sure she is not anemic. Does not sound to be true vertigo.

## 2014-05-27 NOTE — Assessment & Plan Note (Signed)
Recent CT abdomen July 2015 without acute findings. Will check CBC, CMP, lipase. Check EKG given arm tingling please see note for interpretation. Will trial Zofran for nausea as well as Protonix for possible GERD.

## 2014-06-18 ENCOUNTER — Telehealth: Payer: Self-pay | Admitting: Internal Medicine

## 2014-06-18 NOTE — Telephone Encounter (Signed)
Can she give a specific reason, as this would be needed, and there have been some abd pain, as well as ? Reflux and nausea recently

## 2014-06-18 NOTE — Telephone Encounter (Signed)
Pt called in and is requesting another referral for GI.

## 2014-06-19 NOTE — Telephone Encounter (Signed)
Called the patient left a detailed message of PCP's request for the need for referral to GI and to call back with specific reason for GI referral.

## 2014-06-19 NOTE — Telephone Encounter (Signed)
Called the patient left a detailed message of request.

## 2014-06-21 ENCOUNTER — Telehealth: Payer: Self-pay | Admitting: Internal Medicine

## 2014-06-21 DIAGNOSIS — R109 Unspecified abdominal pain: Secondary | ICD-10-CM

## 2014-06-21 DIAGNOSIS — R110 Nausea: Secondary | ICD-10-CM

## 2014-06-21 NOTE — Telephone Encounter (Signed)
Pt called back in and stated that the specific reason for referral is pain right side for 3 months and feel nauseous all the time.

## 2014-06-21 NOTE — Telephone Encounter (Signed)
Referral done

## 2014-07-15 ENCOUNTER — Encounter: Payer: Self-pay | Admitting: Internal Medicine

## 2014-07-17 ENCOUNTER — Encounter: Payer: Self-pay | Admitting: Internal Medicine

## 2014-07-19 HISTORY — PX: COLONOSCOPY: SHX174

## 2014-08-27 ENCOUNTER — Encounter: Payer: Self-pay | Admitting: *Deleted

## 2014-09-06 ENCOUNTER — Ambulatory Visit: Payer: 59 | Admitting: Internal Medicine

## 2014-09-10 ENCOUNTER — Ambulatory Visit (INDEPENDENT_AMBULATORY_CARE_PROVIDER_SITE_OTHER): Payer: BLUE CROSS/BLUE SHIELD | Admitting: Internal Medicine

## 2014-09-10 ENCOUNTER — Encounter: Payer: Self-pay | Admitting: Internal Medicine

## 2014-09-10 VITALS — BP 100/70 | HR 80 | Ht 64.75 in | Wt 167.1 lb

## 2014-09-10 DIAGNOSIS — K5909 Other constipation: Secondary | ICD-10-CM

## 2014-09-10 DIAGNOSIS — K5900 Constipation, unspecified: Secondary | ICD-10-CM

## 2014-09-10 DIAGNOSIS — R109 Unspecified abdominal pain: Secondary | ICD-10-CM

## 2014-09-10 MED ORDER — HYOSCYAMINE SULFATE 0.125 MG SL SUBL: SUBLINGUAL_TABLET | SUBLINGUAL | Status: DC

## 2014-09-10 MED ORDER — LUBIPROSTONE 8 MCG PO CAPS: 8.0000 ug | ORAL_CAPSULE | Freq: Two times a day (BID) | ORAL | Status: DC

## 2014-09-10 NOTE — Progress Notes (Signed)
Patient ID: MONTI VILLERS, female   DOB: 1966/01/06, 49 y.o.   MRN: [id number] HPI: Samera Macy is a 49 year old female with history of benign pancreatic tail cyst, remote DVT s/p 6 months of warfarin, history of migraines, history of lumbar disc disease who is seen in consultation at the request of Dr. Jenny Reichmann to evaluate right middle to lower quadrant abdominal pain. She reports that she has developed crampy or "grabbing" abdominal pain since June 2015. This started somewhat acutely in June 2015 and lead to an ER visit where nothing specifically was discovered. She tells me that inflammation of the appendix, gallbladder and kidney stones were excluded. Since this time she has had this pain on and on and off fashion. It seems to be at times aggravated by eating but no specific food. The pain can last anywhere from 5-10 minutes. It sometimes gets better with lying down. It can be associated with nausea and at times dry heaves. She does have nausea on occasion without this pain. No change in bowel habit including no diarrhea. She does at times feel constipated. His constipation is long-standing for her. When she is not had a bowel movement for several days she can fill bloating. She's tried Metamucil and MiraLAX with no benefit. She denies blood in her stool or melena. No weight loss. No change in appetite. She has had a prior hysterectomy in 2014. Bowel movements are currently occurring approximately 2 days per week. No dysphagia or odynophagia. No family history other than her father who had alcoholism.  She recalls an upper endoscopy which occurred around high school graduation and she was told she had gastritis. She does not recall prior H. pylori diagnosis or treatment.  Past Medical History  Diagnosis Date  . ACUTE NASOPHARYNGITIS 05/07/2010  . BACK PAIN 02/05/2010  . CELLULITIS 01/13/2010  . CHEST PAIN UNSPECIFIED 08/23/2008  . CONSTIPATION, CHRONIC 04/09/2007  . DEEP VENOUS THROMBOPHLEBITIS, LEFT, LEG, HX  OF 04/09/2007  . [place] DISEASE, 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  . DIZZINESS 08/15/2008  . DYSPNEA 05/20/2008  . FATIGUE 05/20/2008  . KNEE PAIN, LEFT 09/11/2007  . MIGRAINE HEADACHE 04/09/2007  . Palpitations 05/20/2008  . RASH-NONVESICULAR 01/17/2008  . RUQ PAIN 02/23/2010  . SICKLE CELL TRAIT 04/09/2007  . Tension headache 04/09/2007  . VARICOSE VEINS LOWER EXTREMITIES W/OTH COMPS 09/11/2007  . Allergic rhinitis, cause unspecified 04/19/2011  . Left sided sciatica 04/19/2011  . Anemia     History of anemia  . PREMATURE VENTRICULAR CONTRACTIONS 08/23/2008  . PONV (postoperative nausea and vomiting)   . Ovarian cyst   . Anxiety   . Pancreatic lesion     Past Surgical History  Procedure Laterality Date  . Tonsillectomy  07/18/2009  . Cesarean section  2004  . Tubal ligation  01/06/2012    with cyst removal  . Laparoscopic assisted vaginal hysterectomy N/A 10/23/2012    Procedure: LAPAROSCOPIC ASSISTED VAGINAL HYSTERECTOMY;  Surgeon: Margarette Asal, MD;  Location: [facility];  Service: Gynecology;  Laterality: N/A;    Outpatient Prescriptions Prior to Visit  Medication Sig Dispense Refill  . HYDROcodone-acetaminophen (NORCO) 5-325 MG per tablet Take 1-2 tablets by mouth every 6 (six) hours as needed. 10 tablet 0  . ondansetron (ZOFRAN-ODT) 4 MG disintegrating tablet Take 1 tablet (4 mg total) by mouth every 8 (eight) hours as needed for nausea or vomiting. 20 tablet 0  . pantoprazole (PROTONIX) 40 MG tablet Take 1 tablet (40 mg total) by mouth daily. 30 tablet 3  No facility-administered medications prior to visit.    No Known Allergies  Family History  Problem Relation Age of Onset  . Heart disease Mother   . Hypertension Mother   . Liver cancer Maternal Aunt   . Brain cancer Maternal Aunt   . Lung cancer Maternal Aunt   . Heart attack Father   . Pancreatitis Father     History  Substance Use Topics  . Smoking status: Never Smoker   . Smokeless tobacco: Never Used  .  Alcohol Use: Yes     Comment: socially    ROS: As per history of present illness, otherwise negative  BP 100/70 mmHg  Pulse 80  Ht 5' 4.75" (1.645 m)  Wt 167 lb 2 oz (75.807 kg)  BMI 28.01 kg/m2  LMP 10/05/2012 Constitutional: Well-developed and well-nourished. No distress. HEENT: Normocephalic and atraumatic. Oropharynx is clear and moist. No oropharyngeal exudate. Conjunctivae are normal.  No scleral icterus. Neck: Neck supple. Trachea midline. Cardiovascular: Normal rate, regular rhythm and intact distal pulses. No M/R/G Pulmonary/chest: Effort normal and breath sounds normal. No wheezing, rales or rhonchi. Abdominal: Soft, abdominal tenderness without rebound or guarding, nondistended. Bowel sounds active throughout. No hepatosplenomegaly. Extremities: no clubbing, cyanosis, or edema Lymphadenopathy: No cervical adenopathy noted. Neurological: Alert and oriented to person place and time. Skin: Skin is warm and dry. No rashes noted. Psychiatric: Normal mood and affect. Behavior is normal.  RELEVANT LABS AND IMAGING: CBC    Component Value Date/Time   WBC 6.6 05/24/2014 1634   RBC 5.17* 05/24/2014 1634   HGB 14.5 05/24/2014 1634   HCT 44.8 05/24/2014 1634   PLT 211.0 05/24/2014 1634   MCV 86.6 05/24/2014 1634   MCH 29.0 12/19/2013 0120   MCHC 32.4 05/24/2014 1634   RDW 13.5 05/24/2014 1634   LYMPHSABS 1.5 12/19/2013 0120   MONOABS 0.3 12/19/2013 0120   EOSABS 0.5 12/19/2013 0120   BASOSABS 0.0 12/19/2013 0120    CMP     Component Value Date/Time   NA 137 05/24/2014 1634   K 3.5 05/24/2014 1634   CL 104 05/24/2014 1634   CO2 27 05/24/2014 1634   GLUCOSE 72 05/24/2014 1634   BUN 11 05/24/2014 1634   CREATININE 0.9 05/24/2014 1634   CALCIUM 9.4 05/24/2014 1634   PROT 7.7 05/24/2014 1634   ALBUMIN 3.8 05/24/2014 1634   AST 19 05/24/2014 1634   ALT 13 05/24/2014 1634   ALKPHOS 39 05/24/2014 1634   BILITOT 0.3 05/24/2014 1634   GFRNONAA 76* 12/19/2013 0120    GFRAA 88* 12/19/2013 0120   CLINICAL DATA:  Right upper quadrant pain, nausea   EXAM: ULTRASOUND ABDOMEN COMPLETE   COMPARISON:  CT abdomen pelvis dated 09/19/2012   FINDINGS: Gallbladder:   No gallstones, gallbladder wall thickening, or pericholecystic fluid. Negative sonographic Murphy's sign.   Common bile duct:   Diameter: 2 mm   Liver:   No focal lesion identified. Within normal limits in parenchymal echogenicity.   IVC:   Poorly visualized.   Pancreas:   Visualized portions are unremarkable.   Spleen:   Measures 5.8 cm.   Right Kidney:   Length: 10.2 cm.  8 x 8 x 9 mm interpolar cyst.  No hydronephrosis.   Left Kidney:   Length: 10.9 cm.  No mass or hydronephrosis.   Abdominal aorta:   No aneurysm visualized.   Other findings:   None.   IMPRESSION: Negative abdominal ultrasound.     Electronically Signed   By: Bertis Ruddy  [place] Pink M.D.   On: 12/19/2013 11:41 CLINICAL DATA:  Right upper quadrant abdominal pain   EXAM: CT ABDOMEN WITH CONTRAST   TECHNIQUE: Multidetector CT imaging of the abdomen was performed using the standard protocol following bolus administration of intravenous contrast.   CONTRAST:  174mL OMNIPAQUE IOHEXOL 300 MG/ML  SOLN   COMPARISON:  09/19/2012   FINDINGS: The lung bases are clear. No pleural effusion or pulmonary nodule. The heart is normal in size. No pericardial effusion. The distal esophagus is grossly normal.   The liver is unremarkable. No focal lesions or biliary dilatation. The gallbladder is normal. No common bile duct dilatation. There is a 7.5 mm low-attenuation lesion in the pancreatic tail. This is most likely a benign cyst or possibly a fatty cleft. This was also present on a prior CT scan for 3295 and is most certainly benign. The spleen is normal in size. No focal lesions. The adrenal glands and kidneys are normal.   The stomach, duodenum, visualized small bowel and visualized colon are  unremarkable. No inflammatory changes, mass lesions or obstructive findings. No mesenteric or retroperitoneal mass or adenopathy. The aorta and branch vessels are normal. The major venous structures are patent.   The bony structures are unremarkable.   IMPRESSION: 1. 7.5 mm low-attenuation lesion in the pancreatic tail is stable since 2006 and is consistent with a benign cyst. 2. No acute abdominal findings, mass lesions or lymphadenopathy.     Electronically Signed   By: Kalman Jewels M.D.   On: 01/17/2014 14:50    ASSESSMENT/PLAN: 49 year old female with history of benign pancreatic tail cyst, remote DVT s/p 6 months of warfarin, history of migraines, history of lumbar disc disease who is seen in consultation at the request of Dr. Jenny Reichmann to evaluate right middle to lower quadrant abdominal pain.  1. RMQ abd pain/constipation -- she does have mild constipation and her abdominal pain comes and goes. It is noticeable on most days but is very short-lived. There is no evidence for diverticulitis. No change in bowel habit or specific colonic alarm symptom. Ultrasound did not show some gallstones or gallbladder dysfunction, the biliary dyskinesia is possible. Given her history of gastritis I'm going to check H. pylori stool antigen. Levsin to be used as needed and as directed for cramping type pain. I'm going to start lubiprostone 8 g twice daily to see if more frequent bowel movements and treating constipation improves the pain. If not I would recommend HIDA scan with CCK and consider colonoscopy. I asked that she call us in 2 weeks to update [place] as to how she is feeling. If better we will continue with current treatment. Follow-up in 8-12 weeks in clinic but expect a call from her in 2 weeks.      JO:ACZYS W John, Md [address] [place], [place] [address]

## 2014-09-10 NOTE — Patient Instructions (Signed)
Your physician has requested that you go to the basement for the following lab work before leaving today: H Pylori stool antigen  We have sent the following medications to your pharmacy for you to pick up at your convenience: Levsin SL-Take 1-2 tablets every 6 hours as needed Amitiza 8 mcg twice daily  Please follow up with Dr Hilarie Fredrickson in 8-12 weeks.   Please call our office in 2 weeks to let us know how the laxative is working for you. If not, we will pursue additional testing.  CC:Dr Cathlean Cower

## 2014-09-16 ENCOUNTER — Other Ambulatory Visit: Payer: BLUE CROSS/BLUE SHIELD

## 2014-09-16 DIAGNOSIS — R109 Unspecified abdominal pain: Secondary | ICD-10-CM

## 2014-09-17 LAB — HELICOBACTER PYLORI  SPECIAL ANTIGEN: H. PYLORI Antigen: NEGATIVE

## 2014-09-24 ENCOUNTER — Telehealth: Payer: Self-pay | Admitting: Internal Medicine

## 2014-09-24 NOTE — Telephone Encounter (Signed)
Pt called for Hpylori stool antigen results. Discussed with pt that the results were negative and infection has cleared. Pt states she is still having problems with RLQ abd pain like she had before treatment. States that the pain comes and goes, pt wants to know what Dr. Hilarie Fredrickson suggests. Please advise.

## 2014-09-24 NOTE — Telephone Encounter (Signed)
Would recommend CCK-HIDA and colonoscopy

## 2014-09-25 ENCOUNTER — Other Ambulatory Visit: Payer: Self-pay

## 2014-09-25 DIAGNOSIS — R109 Unspecified abdominal pain: Secondary | ICD-10-CM

## 2014-09-25 NOTE — Telephone Encounter (Signed)
Spoke with pt and she is aware. Pt scheduled for HIDA scan at [facility] 09/27/14@9am , pt to arrive there at 8:45am and be NPO after midnight. Pt scheduled for previsit 10/21/14@3 :30pm, Colon scheduled in the [facility] 10/28/14@2 :30pm. Pt aware of appts.

## 2014-09-27 ENCOUNTER — Ambulatory Visit (HOSPITAL_COMMUNITY)
Admission: RE | Admit: 2014-09-27 | Discharge: 2014-09-27 | Disposition: A | Discharge status: Home / Self Care | Payer: BLUE CROSS/BLUE SHIELD | Source: Ambulatory Visit | Attending: Internal Medicine | Admitting: Internal Medicine

## 2014-09-27 DIAGNOSIS — R1011 Right upper quadrant pain: Secondary | ICD-10-CM | POA: Insufficient documentation

## 2014-09-27 DIAGNOSIS — R109 Unspecified abdominal pain: Secondary | ICD-10-CM

## 2014-09-27 MED ORDER — SINCALIDE 5 MCG IJ SOLR
INTRAMUSCULAR | Status: AC
  Filled 2014-09-27: qty 5

## 2014-09-27 MED ORDER — STERILE WATER FOR INJECTION IJ SOLN
INTRAMUSCULAR | Status: AC
Start: 2014-09-27 — End: 2014-09-27
  Administered 2014-09-27: 5 mL
  Filled 2014-09-27: qty 10

## 2014-09-27 MED ORDER — SINCALIDE 5 MCG IJ SOLR
0.0200 ug/kg | Freq: Once | INTRAMUSCULAR | Status: AC
  Administered 2014-09-27: 3.8 ug via INTRAVENOUS

## 2014-10-01 ENCOUNTER — Telehealth: Payer: Self-pay

## 2014-10-01 NOTE — Telephone Encounter (Signed)
Spoke with pt and she is aware of appt. 

## 2014-10-01 NOTE — Telephone Encounter (Signed)
-----   Message from Algernon Huxley, RN sent at 09/30/2014  3:17 PM EDT ----- Regarding: [facility] OV Pt scheduled to see Dr. Harlow Asa 10/15/14@1 :30pm, Pt needs to arrive there at 1pm.

## 2014-10-21 ENCOUNTER — Ambulatory Visit (AMBULATORY_SURGERY_CENTER): Payer: Self-pay | Admitting: *Deleted

## 2014-10-21 VITALS — Ht 65.0 in | Wt 171.0 lb

## 2014-10-21 DIAGNOSIS — R109 Unspecified abdominal pain: Secondary | ICD-10-CM

## 2014-10-21 MED ORDER — MOVIPREP 100 G PO SOLR: ORAL | Status: DC

## 2014-10-21 NOTE — Progress Notes (Signed)
Patient denies any allergies to eggs or soy. Patient has post-op nausea and vomiting with anesthesia. Patient denies any oxygen use at home and does not take any diet/weight loss medications. EMMI education assisgned to patient on colonoscopy, this was explained and instructions given to patient.

## 2014-10-28 ENCOUNTER — Encounter: Payer: BLUE CROSS/BLUE SHIELD | Admitting: Internal Medicine

## 2014-11-05 ENCOUNTER — Encounter: Payer: Self-pay | Admitting: Internal Medicine

## 2014-11-18 ENCOUNTER — Encounter: Payer: Self-pay | Admitting: Internal Medicine

## 2014-11-18 ENCOUNTER — Ambulatory Visit (AMBULATORY_SURGERY_CENTER): Payer: BLUE CROSS/BLUE SHIELD | Admitting: Internal Medicine

## 2014-11-18 VITALS — BP 130/85 | HR 78 | Temp 98.5°F | Resp 38 | Ht 65.0 in | Wt 171.0 lb

## 2014-11-18 DIAGNOSIS — R109 Unspecified abdominal pain: Secondary | ICD-10-CM | POA: Diagnosis not present

## 2014-11-18 MED ORDER — SODIUM CHLORIDE 0.9 % IV SOLN: 500.0000 mL | INTRAVENOUS | Status: DC

## 2014-11-18 MED ORDER — HYOSCYAMINE SULFATE 0.125 MG SL SUBL: 0.1250 mg | SUBLINGUAL_TABLET | SUBLINGUAL | Status: DC | PRN

## 2014-11-18 NOTE — Patient Instructions (Signed)
Discharge instructions given. Normal exam. Resume previous medications. YOU HAD AN ENDOSCOPIC PROCEDURE TODAY AT THE [facility]:   Refer to the procedure report that was given to you for any specific questions about what was found during the examination.  If the procedure report does not answer your questions, please call your gastroenterologist to clarify.  If you requested that your care partner not be given the details of your procedure findings, then the procedure report has been included in a sealed envelope for you to review at your convenience later.  YOU SHOULD EXPECT: Some feelings of bloating in the abdomen. Passage of more gas than usual.  Walking can help get rid of the air that was put into your GI tract during the procedure and reduce the bloating. If you had a lower endoscopy (such as a colonoscopy or flexible sigmoidoscopy) you may notice spotting of blood in your stool or on the toilet paper. If you underwent a bowel prep for your procedure, you may not have a normal bowel movement for a few days.  Please Note:  You might notice some irritation and congestion in your nose or some drainage.  This is from the oxygen used during your procedure.  There is no need for concern and it should clear up in a day or so.  SYMPTOMS TO REPORT IMMEDIATELY:   Following lower endoscopy (colonoscopy or flexible sigmoidoscopy):  Excessive amounts of blood in the stool  Significant tenderness or worsening of abdominal pains  Swelling of the abdomen that is new, acute  Fever of 100F or higher   For urgent or emergent issues, a gastroenterologist can be reached at any hour by calling [phone].   DIET: Your first meal following the procedure should be a small meal and then it is ok to progress to your normal diet. Heavy or fried foods are harder to digest and may make you feel nauseous or bloated.  Likewise, meals heavy in dairy and vegetables can increase bloating.  Drink plenty  of fluids but you should avoid alcoholic beverages for 24 hours.  ACTIVITY:  You should plan to take it easy for the rest of today and you should NOT DRIVE or use heavy machinery until tomorrow (because of the sedation medicines used during the test).    FOLLOW UP: Our staff will call the number listed on your records the next business day following your procedure to check on you and address any questions or concerns that you may have regarding the information given to you following your procedure. If we do not reach you, we will leave a message.  However, if you are feeling well and you are not experiencing any problems, there is no need to return our call.  We will assume that you have returned to your regular daily activities without incident.  If any biopsies were taken you will be contacted by phone or by letter within the next 1-3 weeks.  Please call us at [phone] if you have not heard about the biopsies in 3 weeks.    SIGNATURES/CONFIDENTIALITY: You and/or your care partner have signed paperwork which will be entered into your electronic medical record.  These signatures attest to the fact that that the information above on your After Visit Summary has been reviewed and is understood.  Full responsibility of the confidentiality of this discharge information lies with you and/or your care-partner. 

## 2014-11-18 NOTE — Progress Notes (Signed)
Report to PACU, RN, vss, BBS= Clear.  

## 2014-11-18 NOTE — Op Note (Signed)
[place]  [address] [place], [id number]   COLONOSCOPY PROCEDURE REPORT  PATIENT: [name], [name]  MR#: [id number] BIRTHDATE: 12-27-1965 , 5  yrs. old GENDER: female ENDOSCOPIST: Jerene Bears, MD REFERRED XB:JYNWG John, M.D. PROCEDURE DATE:  11/18/2014 PROCEDURE:   Colonoscopy, diagnostic First Screening Colonoscopy - Avg.  risk and is 50 yrs.  old or older - No.  Prior Negative Screening - Now for repeat screening. N/A  History of Adenoma - Now for follow-up colonoscopy & has been > or = to 3 yrs.  N/A ASA CLASS:   Class II INDICATIONS:abdominal pain in the upper right quadrant. MEDICATIONS: Monitored anesthesia care, Propofol 300 mg IV, and Lidocaine 150 mg IV  DESCRIPTION OF PROCEDURE:   After the risks benefits and alternatives of the procedure were thoroughly explained, informed consent was obtained.  The digital rectal exam revealed no abnormalities of the rectum.   The LB [id number]  endoscope was introduced through the anus and advanced to the cecum, which was identified by both the appendix and ileocecal valve. No adverse events experienced.   The quality of the prep was good, using MoviPrep  The instrument was then slowly withdrawn as the colon was fully examined.    COLON FINDINGS: A normal appearing cecum, ileocecal valve, and appendiceal orifice were identified.  the ascending, transverse, descending, sigmoid colon, and rectum appeared unremarkable.  Colon is redundant. Retroflexed views revealed no abnormalities. The time to cecum = 4.0 Withdrawal time = 9.8   The scope was withdrawn and the procedure completed.  COMPLICATIONS: There were no complications.  ENDOSCOPIC IMPRESSION: Normal colonoscopy  RECOMMENDATIONS: You should continue to follow colorectal cancer screening guidelines for "routine risk" patients with a repeat colonoscopy in 10 years. There is no need for FOBT (stool) testing for at least 5 years.  eSigned:   Jerene Bears, MD 11/18/2014 2:37 PM   cc: Biagio Borg, MD and The Patient

## 2014-11-19 ENCOUNTER — Telehealth: Payer: Self-pay

## 2014-11-19 NOTE — Telephone Encounter (Signed)
No answer, left message

## 2014-12-12 ENCOUNTER — Encounter: Payer: Self-pay | Admitting: *Deleted

## 2015-01-09 ENCOUNTER — Ambulatory Visit: Payer: BLUE CROSS/BLUE SHIELD | Admitting: Internal Medicine

## 2015-02-04 ENCOUNTER — Other Ambulatory Visit: Payer: Self-pay | Admitting: Obstetrics and Gynecology

## 2015-02-05 LAB — CYTOLOGY - PAP

## 2015-02-24 ENCOUNTER — Telehealth: Payer: Self-pay | Admitting: Internal Medicine

## 2015-02-24 NOTE — Telephone Encounter (Signed)
Rec'd from [facility] forward 3 pages to Dr. John °

## 2015-02-27 ENCOUNTER — Other Ambulatory Visit: Payer: Self-pay | Admitting: Orthopedic Surgery

## 2015-02-27 DIAGNOSIS — M5416 Radiculopathy, lumbar region: Secondary | ICD-10-CM

## 2015-03-10 ENCOUNTER — Ambulatory Visit
Admission: RE | Admit: 2015-03-10 | Discharge: 2015-03-10 | Disposition: A | Discharge status: Home / Self Care | Payer: BLUE CROSS/BLUE SHIELD | Source: Ambulatory Visit | Attending: Orthopedic Surgery | Admitting: Orthopedic Surgery

## 2015-03-10 DIAGNOSIS — M5416 Radiculopathy, lumbar region: Secondary | ICD-10-CM

## 2015-03-26 ENCOUNTER — Telehealth: Payer: Self-pay | Admitting: Internal Medicine

## 2015-03-26 NOTE — Telephone Encounter (Signed)
Received records from [place] forwarded 2 pages to Dr. Cathlean Cower 03/26/15 fbg.

## 2015-11-12 ENCOUNTER — Telehealth: Payer: Self-pay | Admitting: Internal Medicine

## 2015-11-12 NOTE — Telephone Encounter (Signed)
Pt states she is still having right sided abdominal pain along with nausea and vomiting. Pt requesting to be seen. Pt scheduled to see Amy Esterwood PA 11/18/15@10am . Pt aware of appt.

## 2015-11-18 ENCOUNTER — Ambulatory Visit: Payer: BLUE CROSS/BLUE SHIELD | Admitting: Physician Assistant

## 2016-01-22 ENCOUNTER — Encounter: Payer: Self-pay | Admitting: Internal Medicine

## 2016-01-22 ENCOUNTER — Telehealth: Payer: Self-pay | Admitting: Internal Medicine

## 2016-01-22 NOTE — Telephone Encounter (Signed)
Pt requesting to be seen sooner than 1st available. Pt scheduled to see Amy Esterwood PA 02/02/16@2 :30pm. Pt aware of appt.

## 2016-02-02 ENCOUNTER — Ambulatory Visit: Payer: BLUE CROSS/BLUE SHIELD | Admitting: Physician Assistant

## 2016-02-27 ENCOUNTER — Encounter: Payer: BLUE CROSS/BLUE SHIELD | Admitting: Internal Medicine

## 2016-03-26 ENCOUNTER — Ambulatory Visit: Payer: BLUE CROSS/BLUE SHIELD | Admitting: Internal Medicine

## 2016-06-04 ENCOUNTER — Encounter: Payer: BLUE CROSS/BLUE SHIELD | Admitting: Internal Medicine

## 2017-03-31 ENCOUNTER — Ambulatory Visit (INDEPENDENT_AMBULATORY_CARE_PROVIDER_SITE_OTHER): Payer: Self-pay | Admitting: Neurology

## 2017-03-31 ENCOUNTER — Ambulatory Visit (INDEPENDENT_AMBULATORY_CARE_PROVIDER_SITE_OTHER): Payer: BLUE CROSS/BLUE SHIELD | Admitting: Neurology

## 2017-03-31 DIAGNOSIS — M5432 Sciatica, left side: Secondary | ICD-10-CM | POA: Diagnosis not present

## 2017-03-31 DIAGNOSIS — Z0289 Encounter for other administrative examinations: Secondary | ICD-10-CM

## 2017-03-31 DIAGNOSIS — M5410 Radiculopathy, site unspecified: Secondary | ICD-10-CM

## 2017-03-31 DIAGNOSIS — M5431 Sciatica, right side: Secondary | ICD-10-CM

## 2017-03-31 NOTE — Progress Notes (Signed)
Full Name: [name] Gender: Female MRN #: [id number] Date of Birth: [birth year]    Visit Date: 03/31/2017 09:14 Age: 51 Years 11 Months Old Examining Physician: Sarina Ill, MD  Referring Physician: Dr. Rolena Infante  History: Patient with bilateral leg pain  Summary: The right Tibial motor nerve showed reduced amplitude (1.52mV, N>4). All remaining nerves (as detailed below) were within normal limits. All muscles (as detailed below) were within normal limits.     Conclusion: There is reduced right tibial motor amplitude. This can be seen in right L5/S1 radiculopathy but is of uncertain significance given otherwise normal sensory and motor potentials with normal emg needle exam.    Cc: Dr. Sedalia Muta, M.D.  [facility] [place], [place] [address] Tel: [phone] Fax: [phone]        [facility]    Nerve / Sites Muscle Latency Ref. Amplitude Ref. Rel Amp Segments Distance Velocity Ref. Area    ms ms mV mV %  cm m/s m/s mVms  R Peroneal - EDB     Ankle EDB 4.7 ?6.5 7.6 ?2.0 100 Ankle - EDB 9   22.3     Fib head EDB 10.5  7.1  93.3 Fib head - Ankle 31 53 ?44 21.2     Pop fossa EDB 12.3  7.3  103 Pop fossa - Fib head 10 56 ?44 21.6         Pop fossa - Ankle      L Peroneal - EDB     Ankle EDB 4.4 ?6.5 6.8 ?2.0 100 Ankle - EDB 9   19.8     Fib head EDB 10.5  6.3  91.9 Fib head - Ankle 31 50 ?44 19.4     Pop fossa EDB 11.9  6.0  95.6 Pop fossa - Fib head 8 57 ?44 19.2         Pop fossa - Ankle      R Tibial - AH     Ankle AH 5.5 ?5.8 1.9 ?4.0 100 Ankle - AH 9   4.0     Pop fossa AH 13.0  1.9  99.9 Pop fossa - Ankle 36 48 ?41 5.4  L Tibial - AH     Ankle AH 5.5 ?5.8 5.5 ?4.0 100 Ankle - AH 9   8.8     Pop fossa AH 12.6  4.8  87.5 Pop fossa - Ankle 36 51 ?41 11.0             SNC    Nerve / Sites Rec. Site Peak Lat Ref.  Amp Ref. Segments Distance    ms ms V V  cm  R Sural - Ankle (Calf)     Calf Ankle 3.4 ?4.4 13 ?6 Calf - Ankle  14  L Sural - Ankle (Calf)     Calf Ankle 3.5 ?4.4 25 ?6 Calf - Ankle 14  R Superficial peroneal - Ankle     Lat leg Ankle 3.7 ?4.4 28 ?6 Lat leg - Ankle 14  L Superficial peroneal - Ankle     Lat leg Ankle 3.9 ?4.4 20 ?6 Lat leg - Ankle 14             F  Wave    Nerve F Lat Ref.   ms ms  R Tibial - AH 48.0 ?56.0  L Tibial - AH 43.2 ?56.0         H Reflex  Nerve H Lat Lat Hmax   ms ms   Left Right Ref. Left Right Ref.  Tibial - [place] 26.4 28.2 ?35.0 27.5 28.2 ?35.0         EMG full       EMG Summary Table    Spontaneous MUAP Recruitment  Muscle IA Fib PSW Fasc Other Amp Dur. Poly Pattern  L. Vastus medialis Normal None None None _______ Normal Normal Normal Normal  R. Vastus medialis Normal None None None _______ Normal Normal Normal Normal  L. Tibialis anterior Normal None None None _______ Normal Normal Normal Normal  R. Tibialis anterior Normal None None None _______ Normal Normal Normal Normal  L. Gastrocnemius (Medial head) Normal None None None _______ Normal Normal Normal Normal  R. Gastrocnemius (Medial head) Normal None None None _______ Normal Normal Normal Normal  L. Biceps femoris (long head) Normal None None None _______ Normal Normal Normal Normal  R. Biceps femoris (long head) Normal None None None _______ Normal Normal Normal Normal  L. Gluteus medius Normal None None None _______ Normal Normal Normal Normal  R. Gluteus medius Normal None None None _______ Normal Normal Normal Normal  L. Gluteus maximus Normal None None None _______ Normal Normal Normal Normal  R. Gluteus maximus Normal None None None _______ Normal Normal Normal Normal  L. Lumbar paraspinals (low) Normal None None None _______ Normal Normal Normal Normal  R. Lumbar paraspinals (low) Normal None None None _______ Normal Normal Normal Normal

## 2017-04-03 NOTE — Progress Notes (Signed)
See procedure note.

## 2017-04-03 NOTE — Procedures (Signed)
Full Name: [name] Gender: Female MRN #: [id number] Date of Birth: [birth year]    Visit Date: 03/31/2017 09:14 Age: 51 Years 58 Months Old Examining Physician: Sarina Ill, MD  Referring Physician: Dr. Rolena Infante  History: Patient with bilateral leg pain  Summary: The right Tibial motor nerve showed reduced amplitude (1.22mV, N>4). All remaining nerves (as detailed below) were within normal limits. All muscles (as detailed below) were within normal limits.     Conclusion: There is reduced right tibial motor amplitude. This can be seen in right L5/S1 radiculopathy but is of uncertain significance given otherwise normal sensory and motor potentials with normal emg needle exam.    Cc: Dr. Sedalia Muta, M.D.  [facility] [place], [place] [address] Tel: [phone] Fax: [phone]        [facility]    Nerve / Sites Muscle Latency Ref. Amplitude Ref. Rel Amp Segments Distance Velocity Ref. Area    ms ms mV mV %  cm m/s m/s mVms  R Peroneal - EDB     Ankle EDB 4.7 ?6.5 7.6 ?2.0 100 Ankle - EDB 9   22.3     Fib head EDB 10.5  7.1  93.3 Fib head - Ankle 31 53 ?44 21.2     Pop fossa EDB 12.3  7.3  103 Pop fossa - Fib head 10 56 ?44 21.6         Pop fossa - Ankle      L Peroneal - EDB     Ankle EDB 4.4 ?6.5 6.8 ?2.0 100 Ankle - EDB 9   19.8     Fib head EDB 10.5  6.3  91.9 Fib head - Ankle 31 50 ?44 19.4     Pop fossa EDB 11.9  6.0  95.6 Pop fossa - Fib head 8 57 ?44 19.2         Pop fossa - Ankle      R Tibial - AH     Ankle AH 5.5 ?5.8 1.9 ?4.0 100 Ankle - AH 9   4.0     Pop fossa AH 13.0  1.9  99.9 Pop fossa - Ankle 36 48 ?41 5.4  L Tibial - AH     Ankle AH 5.5 ?5.8 5.5 ?4.0 100 Ankle - AH 9   8.8     Pop fossa AH 12.6  4.8  87.5 Pop fossa - Ankle 36 51 ?41 11.0             SNC    Nerve / Sites Rec. Site Peak Lat Ref.  Amp Ref. Segments Distance    ms ms V V  cm  R Sural - Ankle (Calf)     Calf Ankle 3.4 ?4.4 13 ?6 Calf - Ankle  14  L Sural - Ankle (Calf)     Calf Ankle 3.5 ?4.4 25 ?6 Calf - Ankle 14  R Superficial peroneal - Ankle     Lat leg Ankle 3.7 ?4.4 28 ?6 Lat leg - Ankle 14  L Superficial peroneal - Ankle     Lat leg Ankle 3.9 ?4.4 20 ?6 Lat leg - Ankle 14             F  Wave    Nerve F Lat Ref.   ms ms  R Tibial - AH 48.0 ?56.0  L Tibial - AH 43.2 ?56.0         H Reflex  Nerve H Lat Lat Hmax   ms ms   Left Right Ref. Left Right Ref.  Tibial - [place] 26.4 28.2 ?35.0 27.5 28.2 ?35.0         EMG full       EMG Summary Table    Spontaneous MUAP Recruitment  Muscle IA Fib PSW Fasc Other Amp Dur. Poly Pattern  L. Vastus medialis Normal None None None _______ Normal Normal Normal Normal  R. Vastus medialis Normal None None None _______ Normal Normal Normal Normal  L. Tibialis anterior Normal None None None _______ Normal Normal Normal Normal  R. Tibialis anterior Normal None None None _______ Normal Normal Normal Normal  L. Gastrocnemius (Medial head) Normal None None None _______ Normal Normal Normal Normal  R. Gastrocnemius (Medial head) Normal None None None _______ Normal Normal Normal Normal  L. Biceps femoris (long head) Normal None None None _______ Normal Normal Normal Normal  R. Biceps femoris (long head) Normal None None None _______ Normal Normal Normal Normal  L. Gluteus medius Normal None None None _______ Normal Normal Normal Normal  R. Gluteus medius Normal None None None _______ Normal Normal Normal Normal  L. Gluteus maximus Normal None None None _______ Normal Normal Normal Normal  R. Gluteus maximus Normal None None None _______ Normal Normal Normal Normal  L. Lumbar paraspinals (low) Normal None None None _______ Normal Normal Normal Normal  R. Lumbar paraspinals (low) Normal None None None _______ Normal Normal Normal Normal

## 2017-10-06 ENCOUNTER — Encounter: Payer: BLUE CROSS/BLUE SHIELD | Admitting: Internal Medicine

## 2017-11-04 ENCOUNTER — Other Ambulatory Visit: Payer: Self-pay

## 2017-11-04 ENCOUNTER — Emergency Department (HOSPITAL_BASED_OUTPATIENT_CLINIC_OR_DEPARTMENT_OTHER): Payer: Managed Care, Other (non HMO)

## 2017-11-04 ENCOUNTER — Emergency Department (HOSPITAL_BASED_OUTPATIENT_CLINIC_OR_DEPARTMENT_OTHER)
Admission: EM | Admit: 2017-11-04 | Discharge: 2017-11-05 | Disposition: A | Discharge status: Home / Self Care | Payer: Managed Care, Other (non HMO) | Attending: Physician Assistant | Admitting: Physician Assistant

## 2017-11-04 ENCOUNTER — Encounter (HOSPITAL_BASED_OUTPATIENT_CLINIC_OR_DEPARTMENT_OTHER): Payer: Self-pay | Admitting: *Deleted

## 2017-11-04 DIAGNOSIS — R0789 Other chest pain: Secondary | ICD-10-CM

## 2017-11-04 DIAGNOSIS — R079 Chest pain, unspecified: Secondary | ICD-10-CM | POA: Diagnosis not present

## 2017-11-04 LAB — CBC
HEMATOCRIT: 41.9 % (ref 36.0–46.0)
Hemoglobin: 14.9 g/dL (ref 12.0–15.0)
MCH: 28.8 pg (ref 26.0–34.0)
MCHC: 35.6 g/dL (ref 30.0–36.0)
MCV: 81 fL (ref 78.0–100.0)
PLATELETS: 232 10*3/uL (ref 150–400)
RBC: 5.17 MIL/uL — ABNORMAL HIGH (ref 3.87–5.11)
RDW: 13.1 % (ref 11.5–15.5)
WBC: 5 10*3/uL (ref 4.0–10.5)

## 2017-11-04 LAB — BASIC METABOLIC PANEL
Anion gap: 10 (ref 5–15)
BUN: 16 mg/dL (ref 6–20)
CHLORIDE: 104 mmol/L (ref 101–111)
CO2: 26 mmol/L (ref 22–32)
CREATININE: 1.06 mg/dL — AB (ref 0.44–1.00)
Calcium: 9.9 mg/dL (ref 8.9–10.3)
GFR calc Af Amer: >60 mL/min (ref >60)
GFR calc non Af Amer: >60 mL/min (ref >60)
GLUCOSE: 99 mg/dL (ref 65–99)
POTASSIUM: 4 mmol/L (ref 3.5–5.1)
Sodium: 140 mmol/L (ref 135–145)

## 2017-11-04 LAB — TROPONIN I
Troponin I: <0.03 ng/mL (ref <0.03)
Troponin I: <0.03 ng/mL (ref <0.03)

## 2017-11-04 MED ORDER — GI COCKTAIL ~~LOC~~
30.0000 mL | Freq: Once | ORAL | Status: AC
  Administered 2017-11-04: 30 mL via ORAL
  Filled 2017-11-04: qty 30

## 2017-11-04 MED ORDER — IOPAMIDOL (ISOVUE-370) INJECTION 76%
100.0000 mL | Freq: Once | INTRAVENOUS | Status: AC | PRN
  Administered 2017-11-04: 100 mL via INTRAVENOUS

## 2017-11-04 NOTE — ED Triage Notes (Signed)
Watching TV tonight and had a dull pain in the left chest.

## 2017-11-04 NOTE — ED Notes (Signed)
Patient transported to CT 

## 2017-11-04 NOTE — Discharge Instructions (Addendum)
We are unsure what is causing your chest pain today.  We are happy to report that your CT dissection study was negative.  Both your troponins were negative.  And your EKG is reassuring.  Please follow-up with any additional concerns with her primary care physician.  You are always welcome back if you have any change in symptoms or other concerns.

## 2017-11-04 NOTE — ED Provider Notes (Addendum)
[facility] EMERGENCY DEPARTMENT Provider Note   CSN: [id number] Arrival date & time: 11/04/17  1856     History   Chief Complaint Chief Complaint  Patient presents with  . Chest Pain    HPI ELSBETH YEARICK is a 52 y.o. female.  HPI   Patient is a 52 year old female presenting with chest pain.  Patient was holding her grandson were all of a sudden she had chest pain that radiated to her back.  She reports is a 10 out of 10 heaviness.  It radiated down her left shoulder sharpness to the left shoulder.  Patient reports that it continued and then she took one aspirin which made it better.  She reports she does not have any pain right now. Husbadn reports that she ate a large salad right before hand. Patient's been on a new carb free diet.  Other than that she had no recent travel, no recent immobility.  Patient has no history of hypertension hyperlipidemia or diabetes.   Past Medical History:  Diagnosis Date  . ACUTE NASOPHARYNGITIS 05/07/2010  . Allergic rhinitis, cause unspecified 04/19/2011  . Anemia    History of anemia  . Anxiety   . BACK PAIN 02/05/2010  . CELLULITIS 01/13/2010  . CHEST PAIN UNSPECIFIED 08/23/2008  . CONSTIPATION, CHRONIC 04/09/2007  . DEEP VENOUS THROMBOPHLEBITIS, LEFT, LEG, HX OF 04/09/2007  . [place] DISEASE, 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  . DIZZINESS 08/15/2008  . DYSPNEA 05/20/2008  . FATIGUE 05/20/2008  . KNEE PAIN, LEFT 09/11/2007  . Left sided sciatica 04/19/2011  . MIGRAINE HEADACHE 04/09/2007  . Ovarian cyst   . Palpitations 05/20/2008  . Pancreatic lesion   . PONV (postoperative nausea and vomiting)   . PREMATURE VENTRICULAR CONTRACTIONS 08/23/2008  . RASH-NONVESICULAR 01/17/2008  . RUQ PAIN 02/23/2010  . SICKLE CELL TRAIT 04/09/2007  . Tension headache 04/09/2007  . VARICOSE VEINS LOWER EXTREMITIES W/OTH COMPS 09/11/2007    Patient Active Problem List   Diagnosis Date Noted  . Nausea without vomiting 05/27/2014  . RLQ abdominal pain  12/27/2013  . Pelvic pain 12/27/2013  . Right otitis media 08/23/2013  . Adenomyosis 10/24/2012  . Facial weakness 10/04/2012  . Tongue fasciculation 10/04/2012  . Disturbance of skin sensation 10/04/2011  . Tremor 10/04/2011  . Unspecified disorder of urethra and urinary tract 10/04/2011  . Allergic rhinitis, cause unspecified 04/19/2011  . Left sided sciatica 04/19/2011  . Left facial pain 01/13/2011  . Encounter for well adult exam with abnormal findings 01/13/2011  . 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  . BACK PAIN 02/05/2010  . PREMATURE VENTRICULAR CONTRACTIONS 08/23/2008  . Chest pain 08/23/2008  . DIZZINESS 08/15/2008  . FATIGUE 05/20/2008  . Palpitations 05/20/2008  . DYSPNEA 05/20/2008  . TONSILLITIS, CHRONIC 03/13/2008  . RASH-NONVESICULAR 01/17/2008  . KNEE PAIN, LEFT 09/11/2007  . [place] DISEASE, 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  . SICKLE CELL TRAIT 04/09/2007  . Tension headache 04/09/2007  . MIGRAINE HEADACHE 04/09/2007  . CONSTIPATION, CHRONIC 04/09/2007  . DEEP VENOUS THROMBOPHLEBITIS, LEFT, LEG, HX OF 04/09/2007  . ANXIETY 03/07/2007    Past Surgical History:  Procedure Laterality Date  . CESAREAN SECTION  2004  . LAPAROSCOPIC ASSISTED VAGINAL HYSTERECTOMY N/A 10/23/2012   Procedure: LAPAROSCOPIC ASSISTED VAGINAL HYSTERECTOMY;  Surgeon: Margarette Asal, MD;  Location: [facility];  Service: Gynecology;  Laterality: N/A;  . TONSILLECTOMY  07/18/2009  . TUBAL LIGATION  01/06/2012   with cyst removal     OB History    Gravida  3   Para  3   Term      Preterm      AB      Living  3     SAB      TAB      Ectopic      Multiple      Live Births               Home Medications    Prior to Admission medications   Medication Sig Start Date End Date Taking? Authorizing Provider  meloxicam (MOBIC) 15 MG tablet Take 1 tablet (15 mg total) by mouth daily as needed for pain. 11/09/17   Biagio Borg, MD    Family History Family History    Problem Relation Age of Onset  . Heart disease Mother   . Hypertension Mother   . Heart attack Father   . Pancreatitis Father   . Liver cancer Maternal Aunt   . Brain cancer Maternal Aunt   . Lung cancer Maternal Aunt   . Colon cancer Neg Hx     Social History Social History   Tobacco Use  . Smoking status: Never Smoker  . Smokeless tobacco: Never Used  Substance Use Topics  . Alcohol use: Yes    Comment: socially  . Drug use: No     Allergies   Morphine and related   Review of Systems Review of Systems  Constitutional: Negative for activity change.  Respiratory: Negative for shortness of breath.   Cardiovascular: Positive for chest pain.  Gastrointestinal: Negative for abdominal pain.  All other systems reviewed and are negative.    Physical Exam Updated Vital Signs BP 117/77 (BP Location: Right Arm)   Pulse 86   Temp 98 F (36.7 C) (Oral)   Resp 16   Ht 5\' 5"  (1.651 m)   Wt 76.7 kg (169 lb)   LMP 10/06/2012   SpO2 98%   BMI 28.12 kg/m   Physical Exam  Constitutional: She is oriented to person, place, and time. She appears well-developed and well-nourished.  HENT:  Head: Normocephalic and atraumatic.  Eyes: Right eye exhibits no discharge.  Cardiovascular: Normal rate and normal pulses.  Pulmonary/Chest: Effort normal and breath sounds normal.  Abdominal: Soft. Bowel sounds are normal.  Neurological: She is oriented to person, place, and time.  Skin: Skin is warm and dry. She is not diaphoretic.  Psychiatric: She has a normal mood and affect.  Nursing note and vitals reviewed.    ED Treatments / Results  Labs (all labs ordered are listed, but only abnormal results are displayed) Labs Reviewed  BASIC METABOLIC PANEL - Abnormal; Notable for the following components:      Result Value   Creatinine, Ser 1.06 (*)    All other components within normal limits  CBC - Abnormal; Notable for the following components:   RBC 5.17 (*)    All other  components within normal limits  TROPONIN I  TROPONIN I    EKG None   EKG read in muse.  NSR, isolate T wave flipped in 3. Otherswise very similar to prior.   Radiology No results found.  Procedures Procedures (including critical care time)  Medications Ordered in ED Medications  iopamidol (ISOVUE-370) 76 % injection 100 mL (100 mLs Intravenous Contrast Given 11/04/17 2128)  gi cocktail (Maalox,Lidocaine,Donnatal) (30 mLs Oral Given 11/04/17 2340)     Initial Impression / Assessment and Plan / ED Course  I have reviewed the triage vital signs  and the nursing notes.  Pertinent labs & imaging results that were available during my care of the patient were reviewed by me and considered in my medical decision making (see chart for details).     Patient is a 52 year old female presenting with chest pain.  Patient was holding her grandson were all of a sudden she had chest pain that radiated to her back.  She reports is a 10 out of 10 heaviness.  It radiated down her left shoulder sharpness to the left shoulder.  Patient reports that it continued and then she took one aspirin which made it better.  She reports she does not have any pain right now. Husbadn reports that she ate a large salad right before hand. Patient's been on a new carb free diet.  Other than that she had no recent travel, no recent immobility.  Patient has no history of hypertension hyperlipidemia or diabetes.  2:07 PM Will do CT dissection study given the pain radiating to the back.  Otherwise patient appears very well, patient heart score of 3.  Will delta troponin.   2:07 PM Dissection study normal.Delta trop negative.  Plan to discharge home with follow up with PCP.   Likely food related bloating/discomfort.  Final Clinical Impressions(s) / ED Diagnoses   Final diagnoses:  Atypical chest pain    ED Discharge Orders    None       Gertude Benito, Fredia Sorrow, MD 11/06/17 0010    Macarthur Critchley,  MD 11/27/17 1407

## 2017-11-09 ENCOUNTER — Encounter: Payer: Self-pay | Admitting: Internal Medicine

## 2017-11-09 ENCOUNTER — Ambulatory Visit (INDEPENDENT_AMBULATORY_CARE_PROVIDER_SITE_OTHER): Payer: Managed Care, Other (non HMO) | Admitting: Internal Medicine

## 2017-11-09 ENCOUNTER — Other Ambulatory Visit (INDEPENDENT_AMBULATORY_CARE_PROVIDER_SITE_OTHER): Payer: Managed Care, Other (non HMO)

## 2017-11-09 VITALS — BP 122/82 | HR 83 | Temp 98.3°F | Ht 65.0 in | Wt 169.0 lb

## 2017-11-09 DIAGNOSIS — E559 Vitamin D deficiency, unspecified: Secondary | ICD-10-CM | POA: Diagnosis not present

## 2017-11-09 DIAGNOSIS — Z0000 Encounter for general adult medical examination without abnormal findings: Secondary | ICD-10-CM | POA: Diagnosis not present

## 2017-11-09 DIAGNOSIS — Z0001 Encounter for general adult medical examination with abnormal findings: Secondary | ICD-10-CM

## 2017-11-09 DIAGNOSIS — Z114 Encounter for screening for human immunodeficiency virus [HIV]: Secondary | ICD-10-CM

## 2017-11-09 DIAGNOSIS — R079 Chest pain, unspecified: Secondary | ICD-10-CM

## 2017-11-09 LAB — LIPID PANEL
CHOLESTEROL: 161 mg/dL (ref 0–200)
HDL: 81.6 mg/dL (ref >39.00)
LDL CALC: 69 mg/dL (ref 0–99)
NonHDL: 79.28
Total CHOL/HDL Ratio: 2
Triglycerides: 49 mg/dL (ref 0.0–149.0)
VLDL: 9.8 mg/dL (ref 0.0–40.0)

## 2017-11-09 LAB — URINALYSIS, ROUTINE W REFLEX MICROSCOPIC
Bilirubin Urine: NEGATIVE
Hgb urine dipstick: NEGATIVE
Ketones, ur: NEGATIVE
Leukocytes, UA: NEGATIVE
Nitrite: NEGATIVE
PH: 7 (ref 5.0–8.0)
RBC / HPF: NONE SEEN (ref 0–?)
Specific Gravity, Urine: 1.01 (ref 1.000–1.030)
Total Protein, Urine: NEGATIVE
UROBILINOGEN UA: 0.2 (ref 0.0–1.0)
Urine Glucose: NEGATIVE

## 2017-11-09 LAB — TSH: TSH: 1.33 u[IU]/mL (ref 0.35–4.50)

## 2017-11-09 MED ORDER — MELOXICAM 15 MG PO TABS: 15.0000 mg | ORAL_TABLET | Freq: Every day | ORAL | 5 refills | Status: DC | PRN

## 2017-11-09 NOTE — Patient Instructions (Addendum)
Please take all new medication as prescribed - the anti-inflammatory  Please continue all other medications as before, and refills have been done if requested.  Please have the pharmacy call with any other refills you may need.  Please continue your efforts at being more active, low cholesterol diet, and weight control.  You are otherwise up to date with prevention measures today.  Please keep your appointments with your specialists as you may have planned  Please go to the LAB in the Basement (turn left off the elevator) for the tests to be done today  You will be contacted by phone if any changes need to be made immediately.  Otherwise, you will receive a letter about your results with an explanation, but please check with MyChart first.  Please remember to sign up for MyChart if you have not done so, as this will be important to you in the future with finding out test results, communicating by private email, and scheduling acute appointments online when needed.  Please return in 1 year for your yearly visit, or sooner if needed, with Lab testing done 3-5 days before

## 2017-11-09 NOTE — Assessment & Plan Note (Signed)

## 2017-11-09 NOTE — Progress Notes (Signed)
Subjective:    Patient ID: [name], female    DOB: May 27, 1966, 52 y.o.   MRN: [id number]  HPI  Here for wellness and re-stablish as new pt;  Overall doing ok;  Pt denies worsening SOB, DOE, wheezing, orthopnea, PND, worsening LE edema, palpitations, dizziness or syncope.  Pt denies neurological change such as new headache, facial or extremity weakness.  Pt denies polydipsia, polyuria, or low sugar symptoms. Pt states overall good compliance with treatment and medications, good tolerability, and has been trying to follow appropriate diet.  Pt denies worsening depressive symptoms, suicidal ideation or panic. No fever, night sweats, wt loss, loss of appetite, or other constitutional symptoms.  Pt states good ability with ADL's, has low fall risk, home safety reviewed and adequate, no other significant changes in hearing or vision, and only occasionally active with exercise. Also c/o recent SSCP with radiation to the back, tender to touch left upper parasternal area, worse to move the left shoudler and + pleuritic, and neg eval in ED, now overall improved but still at least mild to mod Wt Readings from Last 3 Encounters:  11/09/17 169 lb (76.7 kg)  11/04/17 169 lb (76.7 kg)  11/18/14 171 lb (77.6 kg)  Sched for mammogram next mo.  No other interval hx or change Past Medical History:  Diagnosis Date  . ACUTE NASOPHARYNGITIS 05/07/2010  . Allergic rhinitis, cause unspecified 04/19/2011  . Anemia    History of anemia  . Anxiety   . BACK PAIN 02/05/2010  . CELLULITIS 01/13/2010  . CHEST PAIN UNSPECIFIED 08/23/2008  . CONSTIPATION, CHRONIC 04/09/2007  . DEEP VENOUS THROMBOPHLEBITIS, LEFT, LEG, HX OF 04/09/2007  . [place] DISEASE, 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  . DIZZINESS 08/15/2008  . DYSPNEA 05/20/2008  . FATIGUE 05/20/2008  . KNEE PAIN, LEFT 09/11/2007  . Left sided sciatica 04/19/2011  . MIGRAINE HEADACHE 04/09/2007  . Ovarian cyst   . Palpitations 05/20/2008  . Pancreatic lesion   . PONV  (postoperative nausea and vomiting)   . PREMATURE VENTRICULAR CONTRACTIONS 08/23/2008  . RASH-NONVESICULAR 01/17/2008  . RUQ PAIN 02/23/2010  . SICKLE CELL TRAIT 04/09/2007  . Tension headache 04/09/2007  . VARICOSE VEINS LOWER EXTREMITIES W/OTH COMPS 09/11/2007   Past Surgical History:  Procedure Laterality Date  . CESAREAN SECTION  2004  . LAPAROSCOPIC ASSISTED VAGINAL HYSTERECTOMY N/A 10/23/2012   Procedure: LAPAROSCOPIC ASSISTED VAGINAL HYSTERECTOMY;  Surgeon: Margarette Asal, MD;  Location: [facility];  Service: Gynecology;  Laterality: N/A;  . TONSILLECTOMY  07/18/2009  . TUBAL LIGATION  01/06/2012   with cyst removal    reports that she has never smoked. She has never used smokeless tobacco. She reports that she drinks alcohol. She reports that she does not use drugs. family history includes Brain cancer in her maternal aunt; Heart attack in her father; Heart disease in her mother; Hypertension in her mother; Liver cancer in her maternal aunt; Lung cancer in her maternal aunt; Pancreatitis in her father. Allergies  Allergen Reactions  . Morphine And Related Rash   No current outpatient medications on file prior to visit.   No current facility-administered medications on file prior to visit.    Review of Systems Constitutional: Negative for other unusual diaphoresis, sweats, appetite or weight changes HENT: Negative for other worsening hearing loss, ear pain, facial swelling, mouth sores or neck stiffness.   Eyes: Negative for other worsening pain, redness or other visual disturbance.  Respiratory: Negative for other stridor or swelling Cardiovascular: Negative for other  palpitations or other chest pain  Gastrointestinal: Negative for worsening diarrhea or loose stools, blood in stool, distention or other pain Genitourinary: Negative for hematuria, flank pain or other change in urine volume.  Musculoskeletal: Negative for myalgias or other joint swelling.  Skin: Negative for other color  change, or other wound or worsening drainage.  Neurological: Negative for other syncope or numbness. Hematological: Negative for other adenopathy or swelling Psychiatric/Behavioral: Negative for hallucinations, other worsening agitation, SI, self-injury, or new decreased concentration All other system neg per pt    Objective:   Physical Exam BP 122/82   Pulse 83   Temp 98.3 F (36.8 C) (Oral)   Ht 5\' 5"  (1.651 m)   Wt 169 lb (76.7 kg)   LMP 10/06/2012   SpO2 96%   BMI 28.12 kg/m  VS noted,  Constitutional: Pt is oriented to person, place, and time. Appears well-developed and well-nourished, in no significant distress and comfortable Head: Normocephalic and atraumatic  Eyes: Conjunctivae and EOM are normal. Pupils are equal, round, and reactive to light Right Ear: External ear normal without discharge Left Ear: External ear normal without discharge Nose: Nose without discharge or deformity Mouth/Throat: Oropharynx is without other ulcerations and moist  Neck: Normal range of motion. Neck supple. No JVD present. No tracheal deviation present or significant neck LA or mass Cardiovascular: Normal rate, regular rhythm, normal heart sounds and intact distal pulses.   Pulmonary/Chest: WOB normal and breath sounds without rales or wheezing  Abdominal: Soft. Bowel sounds are normal. NT. No HSM  Musculoskeletal: Normal range of motion. Exhibits no edema Lymphadenopathy: Has no other cervical adenopathy.  Neurological: Pt is alert and oriented to person, place, and time. Pt has normal reflexes. No cranial nerve deficit. Motor grossly intact, Gait intact Skin: Skin is warm and dry. No rash noted or new ulcerations Psychiatric:  Has normal mood and affect. Behavior is normal without agitation No other exam findgins Lab Results  Component Value Date   WBC 5.0 11/04/2017   HGB 14.9 11/04/2017   HCT 41.9 11/04/2017   PLT 232 11/04/2017   GLUCOSE 99 11/04/2017   CHOL 145 10/04/2012   TRIG  29.0 10/04/2012   HDL 64.00 10/04/2012   LDLCALC 75 10/04/2012   ALT 13 05/24/2014   AST 19 05/24/2014   NA 140 11/04/2017   K 4.0 11/04/2017   CL 104 11/04/2017   CREATININE 1.06 (H) 11/04/2017   BUN 16 11/04/2017   CO2 26 11/04/2017   TSH 0.46 10/04/2012   INR 1.0 03/01/2007       Assessment & Plan:

## 2017-11-09 NOTE — Assessment & Plan Note (Signed)
C/w costochondritis - for mobic prn,  to f/u any worsening symptoms or concerns  In addition to the time spent performing CPE, I spent an additional 10 minutes face to face,in which greater than 50% of this time was spent in counseling and coordination of care for patient's illness as documented, including the differential dx, treatment, further evaluation and other management of chest pain

## 2017-11-10 ENCOUNTER — Telehealth: Payer: Self-pay

## 2017-11-10 LAB — VITAMIN D 25 HYDROXY (VIT D DEFICIENCY, FRACTURES): VITD: 28.02 ng/mL — AB (ref 30.00–100.00)

## 2017-11-10 LAB — HIV ANTIBODY (ROUTINE TESTING W REFLEX): HIV: NONREACTIVE

## 2017-11-10 NOTE — Telephone Encounter (Signed)
-----   Message from Biagio Borg, MD sent at 11/10/2017  9:18 AM EDT ----- Left message on MyChart, pt to cont same tx except  The test results show that your current treatment is OK, except the Vit D is moderately low.  Please start OTC Vit D at 2000 units per day (indefinitely) as this is good for bone health.  Shirron to please inform pt

## 2017-11-10 NOTE — Telephone Encounter (Signed)
Pt has viewed results via MyChart  

## 2017-12-16 ENCOUNTER — Ambulatory Visit: Payer: Managed Care, Other (non HMO) | Admitting: Family Medicine

## 2017-12-16 ENCOUNTER — Encounter: Payer: Self-pay | Admitting: Family Medicine

## 2017-12-16 VITALS — BP 136/76 | HR 68 | Ht 65.0 in | Wt 170.0 lb

## 2017-12-16 DIAGNOSIS — I872 Venous insufficiency (chronic) (peripheral): Secondary | ICD-10-CM

## 2017-12-16 NOTE — Patient Instructions (Signed)
Please try to elevate your legs and continue to walk throughout the day  Please try compression on your feet or legs  Please seek immediate care if you have shortness of breath or a fast heart rate    Chronic Venous Insufficiency Chronic venous insufficiency, also called venous stasis, is a condition that prevents blood from being pumped effectively through the veins in your legs. Blood may no longer be pumped effectively from the legs back to the heart. This condition can range from mild to severe. With proper treatment, you should be able to continue with an active life. What are the causes? Chronic venous insufficiency occurs when the vein walls become stretched, weakened, or damaged, or when valves within the vein are damaged. Some common causes of this include:  High blood pressure inside the veins (venous hypertension).  Increased blood pressure in the leg veins from long periods of sitting or standing.  A blood clot that blocks blood flow in a vein (deep vein thrombosis, DVT).  Inflammation of a vein (phlebitis) that causes a blood clot to form.  Tumors in the pelvis that cause blood to back up.  What increases the risk? The following factors may make you more likely to develop this condition:  Having a family history of this condition.  Obesity.  Pregnancy.  Living without enough physical activity or exercise (sedentary lifestyle).  Smoking.  Having a job that requires long periods of standing or sitting in one place.  Being a certain age. Women in their 18s and 17s and men in their 22s are more likely to develop this condition.  What are the signs or symptoms? Symptoms of this condition include:  Veins that are enlarged, bulging, or twisted (varicose veins).  Skin breakdown or ulcers.  Reddened or discolored skin on the front of the leg.  Brown, smooth, tight, and painful skin just above the ankle, usually on the inside of the leg  (lipodermatosclerosis).  Swelling.  How is this diagnosed? This condition may be diagnosed based on:  Your medical history.  A physical exam.  Tests, such as: ? A procedure that creates an image of a blood vessel and nearby organs and provides information about blood flow through the blood vessel (duplex ultrasound). ? A procedure that tests blood flow (plethysmography). ? A procedure to look at the veins using X-ray and dye (venogram).  How is this treated? The goals of treatment are to help you return to an active life and to minimize pain or disability. Treatment depends on the severity of your condition, and it may include:  Wearing compression stockings. These can help relieve symptoms and help prevent your condition from getting worse. However, they do not cure the condition.  Sclerotherapy. This is a procedure involving an injection of a material that "dissolves" damaged veins.  Surgery. This may involve: ? Removing a diseased vein (vein stripping). ? Cutting off blood flow through the vein (laser ablation surgery). ? Repairing a valve.  Follow these instructions at home:  Wear compression stockings as told by your health care provider. These stockings help to prevent blood clots and reduce swelling in your legs.  Take over-the-counter and prescription medicines only as told by your health care provider.  Stay active by exercising, walking, or doing different activities. Ask your health care provider what activities are safe for you and how much exercise you need.  Drink enough fluid to keep your urine clear or pale yellow.  Do not use any products that contain  nicotine or tobacco, such as cigarettes and e-cigarettes. If you need help quitting, ask your health care provider.  Keep all follow-up visits as told by your health care provider. This is important. Contact a health care provider if:  You have redness, swelling, or more pain in the affected area.  You see a  red streak or line that extends up or down from the affected area.  You have skin breakdown or a loss of skin in the affected area, even if the breakdown is small.  You get an injury in the affected area. Get help right away if:  You get an injury and an open wound in the affected area.  You have severe pain that does not get better with medicine.  You have sudden numbness or weakness in the foot or ankle below the affected area, or you have trouble moving your foot or ankle.  You have a fever and you have worse or persistent symptoms.  You have chest pain.  You have shortness of breath. Summary  Chronic venous insufficiency, also called venous stasis, is a condition that prevents blood from being pumped effectively through the veins in your legs.  Chronic venous insufficiency occurs when the vein walls become stretched, weakened, or damaged, or when valves within the vein are damaged.  Treatment for this condition depends on how severe your condition is, and it may involve wearing compression stockings or having a procedure.  Make sure you stay active by exercising, walking, or doing different activities. Ask your health care provider what activities are safe for you and how much exercise you need. This information is not intended to replace advice given to you by your health care provider. Make sure you discuss any questions you have with your health care provider. Document Released: 11/08/2006 Document Revised: 05/24/2016 Document Reviewed: 05/24/2016 Elsevier Interactive Patient Education  2017 [employer].

## 2017-12-16 NOTE — Progress Notes (Signed)
[name] - 52 y.o. female MRN [id number]  Date of birth: [birth year]  SUBJECTIVE:  Including CC & ROS.  Chief Complaint  Patient presents with  . feet pain    [name] is a 52 y.o. female that is presenting with b/l ankle swelling. Swelling has been ongoing for one week. Right is worse than the left. Denies injuries or surgeries.  Denies any travel. Denies any SOB. No pain in the gastro muscles in either leg. No trauma or inciting event.  Swelling improves when she lies down at night and worsens by the end of the day.  She sits daily at work. 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    Review of Systems  Constitutional: Negative for fever.  HENT: Negative for congestion.   Respiratory: Negative for cough.   Cardiovascular: Negative for chest pain.  Gastrointestinal: Negative for abdominal pain.  Musculoskeletal: Negative for gait problem.    HISTORY: Past Medical, Surgical, Social, and Family History Reviewed & Updated per EMR.   Pertinent Historical Findings include:  Past Medical History:  Diagnosis Date  . ACUTE NASOPHARYNGITIS 05/07/2010  . Allergic rhinitis, cause unspecified 04/19/2011  . Anemia    History of anemia  . Anxiety   . BACK PAIN 02/05/2010  . CELLULITIS 01/13/2010  . CHEST PAIN UNSPECIFIED 08/23/2008  . CONSTIPATION, CHRONIC 04/09/2007  . DEEP VENOUS THROMBOPHLEBITIS, LEFT, LEG, HX OF 04/09/2007  . [place] DISEASE, 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  . DIZZINESS 08/15/2008  . DYSPNEA 05/20/2008  . FATIGUE 05/20/2008  . KNEE PAIN, LEFT 09/11/2007  . Left sided sciatica 04/19/2011  . MIGRAINE HEADACHE 04/09/2007  . Ovarian cyst   . Palpitations 05/20/2008  . Pancreatic lesion   . PONV (postoperative nausea and vomiting)   . PREMATURE VENTRICULAR CONTRACTIONS 08/23/2008  . RASH-NONVESICULAR 01/17/2008  . RUQ PAIN 02/23/2010  . SICKLE CELL TRAIT 04/09/2007  . Tension headache 04/09/2007  . VARICOSE VEINS LOWER EXTREMITIES W/OTH COMPS 09/11/2007    Past Surgical History:    Procedure Laterality Date  . CESAREAN SECTION  2004  . LAPAROSCOPIC ASSISTED VAGINAL HYSTERECTOMY N/A 10/23/2012   Procedure: LAPAROSCOPIC ASSISTED VAGINAL HYSTERECTOMY;  Surgeon: Margarette Asal, MD;  Location: [facility];  Service: Gynecology;  Laterality: N/A;  . TONSILLECTOMY  07/18/2009  . TUBAL LIGATION  01/06/2012   with cyst removal    Allergies  Allergen Reactions  . Morphine And Related Rash    Family History  Problem Relation Age of Onset  . Heart disease Mother   . Hypertension Mother   . Heart attack Father   . Pancreatitis Father   . Liver cancer Maternal Aunt   . Brain cancer Maternal Aunt   . Lung cancer Maternal Aunt   . Colon cancer Neg Hx      Social History   Socioeconomic History  . Marital status: Married    Spouse name: Not on file  . Number of children: 3  . Years of education: Not on file  . Highest education level: Not on file  Occupational History  . 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 Financial resource strain: Not on file  . Food insecurity:    Worry: Not on file    Inability: Not on file  . Transportation needs:    Medical: Not on file    Non-medical: Not on file  Tobacco Use  . Smoking status: Never Smoker  . Smokeless tobacco: Never  Used  Substance and Sexual Activity  . Alcohol use: Yes    Comment: socially  . Drug use: No  . Sexual activity: Not on file  Lifestyle  . Physical activity:    Days per week: Not on file    Minutes per session: Not on file  . Stress: Not on file  Relationships  . Social connections:    Talks on phone: Not on file    Gets together: Not on file    Attends religious service: Not on file    Active member of club or organization: Not on file    Attends meetings of clubs or organizations: Not on file    Relationship status: Not on file  . Intimate partner violence:    Fear of current or ex partner: Not on file    Emotionally abused: Not  on file    Physically abused: Not on file    Forced sexual activity: Not on file  Other Topics Concern  . Not on file  Social History Narrative  . Not on file     PHYSICAL EXAM:  VS: BP 136/76 (BP Location: Left Arm, Patient Position: Sitting, Cuff Size: Normal)   Pulse 68   Ht 5\' 5"  (1.651 m)   Wt 170 lb (77.1 kg)   LMP 10/06/2012   BMI 28.29 kg/m  Physical Exam Gen: NAD, alert, cooperative with exam, well-appearing ENT: normal lips, normal nasal mucosa,  Eye: normal EOM, normal conjunctiva and lids CV: trace edema, +2 pedal pulses   Resp: no accessory muscle use, non-labored, no crackles or wheezes  Skin: no rashes, no areas of induration  Neuro: normal tone, normal sensation to touch Psych:  normal insight, alert and oriented MSK:  Left and Right ankle: Normal AROM  No TTP of the medial or lateral gastro  No redness in LE b/l  Neurovascularly intact         ASSESSMENT & PLAN:   Venous (peripheral) insufficiency Symptoms seem c/w venous insufficiency. Not suggestive a clot. No SOB or PND.  - counseled on compression and elevation  - if no improvement consider lab work and lasix.

## 2017-12-18 DIAGNOSIS — I872 Venous insufficiency (chronic) (peripheral): Secondary | ICD-10-CM | POA: Insufficient documentation

## 2017-12-18 NOTE — Assessment & Plan Note (Signed)
Symptoms seem c/w venous insufficiency. Not suggestive a clot. No SOB or PND.  - counseled on compression and elevation  - if no improvement consider lab work and lasix.

## 2018-02-23 ENCOUNTER — Ambulatory Visit: Payer: Managed Care, Other (non HMO) | Admitting: Internal Medicine

## 2018-06-12 ENCOUNTER — Other Ambulatory Visit: Payer: Self-pay

## 2018-06-12 DIAGNOSIS — I83893 Varicose veins of bilateral lower extremities with other complications: Secondary | ICD-10-CM

## 2018-06-19 ENCOUNTER — Inpatient Hospital Stay (HOSPITAL_COMMUNITY): Admission: RE | Admit: 2018-06-19 | Payer: Managed Care, Other (non HMO) | Source: Ambulatory Visit

## 2018-06-20 ENCOUNTER — Encounter: Payer: Managed Care, Other (non HMO) | Admitting: Vascular Surgery

## 2018-07-24 ENCOUNTER — Emergency Department (HOSPITAL_BASED_OUTPATIENT_CLINIC_OR_DEPARTMENT_OTHER)
Admission: EM | Admit: 2018-07-24 | Discharge: 2018-07-24 | Disposition: A | Discharge status: Home / Self Care | Payer: Managed Care, Other (non HMO) | Attending: Emergency Medicine | Admitting: Emergency Medicine

## 2018-07-24 ENCOUNTER — Other Ambulatory Visit: Payer: Self-pay

## 2018-07-24 ENCOUNTER — Encounter (HOSPITAL_BASED_OUTPATIENT_CLINIC_OR_DEPARTMENT_OTHER): Payer: Self-pay | Admitting: Adult Health

## 2018-07-24 ENCOUNTER — Emergency Department (HOSPITAL_BASED_OUTPATIENT_CLINIC_OR_DEPARTMENT_OTHER): Payer: Managed Care, Other (non HMO)

## 2018-07-24 DIAGNOSIS — D573 Sickle-cell trait: Secondary | ICD-10-CM | POA: Diagnosis not present

## 2018-07-24 DIAGNOSIS — F419 Anxiety disorder, unspecified: Secondary | ICD-10-CM | POA: Diagnosis not present

## 2018-07-24 DIAGNOSIS — M5432 Sciatica, left side: Secondary | ICD-10-CM | POA: Diagnosis not present

## 2018-07-24 DIAGNOSIS — Z79899 Other long term (current) drug therapy: Secondary | ICD-10-CM | POA: Insufficient documentation

## 2018-07-24 DIAGNOSIS — R202 Paresthesia of skin: Secondary | ICD-10-CM | POA: Insufficient documentation

## 2018-07-24 DIAGNOSIS — M79662 Pain in left lower leg: Secondary | ICD-10-CM | POA: Diagnosis present

## 2018-07-24 LAB — BASIC METABOLIC PANEL
Anion gap: 7 (ref 5–15)
BUN: 10 mg/dL (ref 6–20)
CO2: 27 mmol/L (ref 22–32)
Calcium: 9.3 mg/dL (ref 8.9–10.3)
Chloride: 103 mmol/L (ref 98–111)
Creatinine, Ser: 0.82 mg/dL (ref 0.44–1.00)
GFR calc Af Amer: >60 mL/min (ref >60)
GFR calc non Af Amer: >60 mL/min (ref >60)
Glucose, Bld: 89 mg/dL (ref 70–99)
Potassium: 3.6 mmol/L (ref 3.5–5.1)
Sodium: 137 mmol/L (ref 135–145)

## 2018-07-24 LAB — CBC WITH DIFFERENTIAL/PLATELET
Abs Immature Granulocytes: 0.01 10*3/uL (ref 0.00–0.07)
Basophils Absolute: 0 10*3/uL (ref 0.0–0.1)
Basophils Relative: 1 %
EOS ABS: 0.2 10*3/uL (ref 0.0–0.5)
EOS PCT: 5 %
HCT: 44.2 % (ref 36.0–46.0)
Hemoglobin: 14.2 g/dL (ref 12.0–15.0)
Immature Granulocytes: 0 %
Lymphocytes Relative: 42 %
Lymphs Abs: 1.5 10*3/uL (ref 0.7–4.0)
MCH: 27.3 pg (ref 26.0–34.0)
MCHC: 32.1 g/dL (ref 30.0–36.0)
MCV: 85 fL (ref 80.0–100.0)
Monocytes Absolute: 0.3 10*3/uL (ref 0.1–1.0)
Monocytes Relative: 9 %
Neutro Abs: 1.6 10*3/uL — ABNORMAL LOW (ref 1.7–7.7)
Neutrophils Relative %: 43 %
Platelets: 200 10*3/uL (ref 150–400)
RBC: 5.2 MIL/uL — ABNORMAL HIGH (ref 3.87–5.11)
RDW: 13.2 % (ref 11.5–15.5)
WBC: 3.7 10*3/uL — ABNORMAL LOW (ref 4.0–10.5)
nRBC: 0 % (ref 0.0–0.2)

## 2018-07-24 LAB — PREGNANCY, URINE: Preg Test, Ur: NEGATIVE

## 2018-07-24 MED ORDER — SODIUM CHLORIDE 0.9 % IV BOLUS
1000.0000 mL | Freq: Once | INTRAVENOUS | Status: AC
  Administered 2018-07-24: 1000 mL via INTRAVENOUS

## 2018-07-24 MED ORDER — KETOROLAC TROMETHAMINE 15 MG/ML IJ SOLN
15.0000 mg | Freq: Once | INTRAMUSCULAR | Status: AC
  Administered 2018-07-24: 15 mg via INTRAVENOUS
  Filled 2018-07-24: qty 1

## 2018-07-24 MED ORDER — PREDNISONE 10 MG PO TABS: 40.0000 mg | ORAL_TABLET | Freq: Every day | ORAL | 0 refills | Status: AC

## 2018-07-24 MED ORDER — METHOCARBAMOL 500 MG PO TABS: 500.0000 mg | ORAL_TABLET | Freq: Two times a day (BID) | ORAL | 0 refills | Status: DC

## 2018-07-24 NOTE — ED Triage Notes (Signed)
HX of DVT in left leg, states Saturday began having hot and cold sensitivity and calf pain in left leg. Feels ike a blood clot from previous. CMS intact. Ibuprofen is not helping. Not currently taking blood thinners.

## 2018-07-24 NOTE — ED Notes (Signed)
Patient transported to Ultrasound 

## 2018-07-24 NOTE — ED Notes (Signed)
C/o left leg pain x 2 days  Sensitive to touch and cold water  Pain increased w movement  States feels like same at dvt  Denies inj

## 2018-07-24 NOTE — ED Provider Notes (Signed)
[facility] Provider Note   CSN: [id number] Arrival date & time: 07/24/18  5465     History   Chief Complaint Chief Complaint  Patient presents with  . Leg Pain    HPI [name] is a 53 y.o. female with history of left leg DVT presenting today for 2-day history of left lower leg pain.  Patient states that symptoms began suddenly 2 days ago she states that she noticed a moderate intensity sharp pain midway down her left calf that has been constant since time of onset.  Pain is worsened with ambulation and movement as well as palpation of the lateral left calf.  Patient states that symptoms feel similar to her first DVT in 2008.  Patient states that for a brief period of time in 2008 she was on Coumadin therapy however was taken off of blood thinning medication in 2008 and has not had recurrence of the problem since.  Additionally patient endorses decreased sensation and movement of the left lower extremity since onset of pain 2 days ago.  Patient states that the extremity feels slightly numb when compared to the right and she endorses weakness with movements.  She has been ambulatory since onset of pain however states that she feels that she is hobbling due to pain and weakness of the left lower extremity.  Patient reports taking ibuprofen last night for symptoms, states improvement of symptoms briefly however pain returned upon ambulation last night.  Additionally on further review of symptoms patient endorsed a right-sided sudden onset headache at the same time of left calf pain with brief period of dizziness that has resolved 2 days ago.  HPI  Past Medical History:  Diagnosis Date  . ACUTE NASOPHARYNGITIS 05/07/2010  . Allergic rhinitis, cause unspecified 04/19/2011  . Anemia    History of anemia  . Anxiety   . BACK PAIN 02/05/2010  . CELLULITIS 01/13/2010  . CHEST PAIN UNSPECIFIED 08/23/2008  . CONSTIPATION, CHRONIC 04/09/2007  . DEEP VENOUS  THROMBOPHLEBITIS, LEFT, LEG, HX OF 04/09/2007  . [place] DISEASE, 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  . DIZZINESS 08/15/2008  . DYSPNEA 05/20/2008  . FATIGUE 05/20/2008  . KNEE PAIN, LEFT 09/11/2007  . Left sided sciatica 04/19/2011  . MIGRAINE HEADACHE 04/09/2007  . Ovarian cyst   . Palpitations 05/20/2008  . Pancreatic lesion   . PONV (postoperative nausea and vomiting)   . PREMATURE VENTRICULAR CONTRACTIONS 08/23/2008  . RASH-NONVESICULAR 01/17/2008  . RUQ PAIN 02/23/2010  . SICKLE CELL TRAIT 04/09/2007  . Tension headache 04/09/2007  . VARICOSE VEINS LOWER EXTREMITIES W/OTH COMPS 09/11/2007    Patient Active Problem List   Diagnosis Date Noted  . Venous (peripheral) insufficiency 12/18/2017  . Nausea without vomiting 05/27/2014  . RLQ abdominal pain 12/27/2013  . Pelvic pain 12/27/2013  . Right otitis media 08/23/2013  . Adenomyosis 10/24/2012  . Facial weakness 10/04/2012  . Tongue fasciculation 10/04/2012  . Disturbance of skin sensation 10/04/2011  . Tremor 10/04/2011  . Unspecified disorder of urethra and urinary tract 10/04/2011  . Allergic rhinitis, cause unspecified 04/19/2011  . Left sided sciatica 04/19/2011  . Left facial pain 01/13/2011  . Encounter for well adult exam with abnormal findings 01/13/2011  . 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  . BACK PAIN 02/05/2010  . PREMATURE VENTRICULAR CONTRACTIONS 08/23/2008  . Chest pain 08/23/2008  . DIZZINESS 08/15/2008  . FATIGUE 05/20/2008  . Palpitations 05/20/2008  . DYSPNEA 05/20/2008  . TONSILLITIS, CHRONIC 03/13/2008  . RASH-NONVESICULAR 01/17/2008  .  KNEE PAIN, LEFT 09/11/2007  . [place] DISEASE, 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  . SICKLE CELL TRAIT 04/09/2007  . Tension headache 04/09/2007  . MIGRAINE HEADACHE 04/09/2007  . CONSTIPATION, CHRONIC 04/09/2007  . DEEP VENOUS THROMBOPHLEBITIS, LEFT, LEG, HX OF 04/09/2007  . ANXIETY 03/07/2007    Past Surgical History:  Procedure Laterality Date  . CESAREAN SECTION  2004   . LAPAROSCOPIC ASSISTED VAGINAL HYSTERECTOMY N/A 10/23/2012   Procedure: LAPAROSCOPIC ASSISTED VAGINAL HYSTERECTOMY;  Surgeon: Margarette Asal, MD;  Location: [facility];  Service: Gynecology;  Laterality: N/A;  . TONSILLECTOMY  07/18/2009  . TUBAL LIGATION  01/06/2012   with cyst removal     OB History    Gravida  3   Para  3   Term      Preterm      AB      Living  3     SAB      TAB      Ectopic      Multiple      Live Births               Home Medications    Prior to Admission medications   Medication Sig Start Date End Date Taking? Authorizing Provider  meloxicam (MOBIC) 15 MG tablet Take 1 tablet (15 mg total) by mouth daily as needed for pain. Patient not taking: Reported on 12/16/2017 11/09/17   Biagio Borg, MD  methocarbamol (ROBAXIN) 500 MG tablet Take 1 tablet (500 mg total) by mouth 2 (two) times daily. 07/24/18   Nuala Alpha A, PA-C  predniSONE (DELTASONE) 10 MG tablet Take 4 tablets (40 mg total) by mouth daily for 5 days. 07/24/18 07/29/18  Deliah Boston, PA-C  valACYclovir (VALTREX) 500 MG tablet  12/07/17   [provider]    Family History Family History  Problem Relation Age of Onset  . Heart disease Mother   . Hypertension Mother   . Heart attack Father   . Pancreatitis Father   . Liver cancer Maternal Aunt   . Brain cancer Maternal Aunt   . Lung cancer Maternal Aunt   . Colon cancer Neg Hx     Social History Social History   Tobacco Use  . Smoking status: Never Smoker  . Smokeless tobacco: Never Used  Substance Use Topics  . Alcohol use: Yes    Comment: socially  . Drug use: No     Allergies   Morphine and related   Review of Systems Review of Systems  Constitutional: Negative.  Negative for chills and fever.  HENT: Negative.  Negative for rhinorrhea and sore throat.   Eyes: Negative.  Negative for visual disturbance.  Respiratory: Negative.  Negative for cough and shortness of breath.   Cardiovascular:  Negative.  Negative for chest pain.  Gastrointestinal: Negative.  Negative for abdominal pain, blood in stool, diarrhea, nausea and vomiting.  Genitourinary: Negative.  Negative for dysuria and hematuria.  Musculoskeletal: Positive for gait problem and myalgias (Left Calf). Negative for arthralgias.  Skin: Negative.  Negative for color change and rash.  Neurological: Positive for dizziness (Resolved two days ago), weakness (LLE), numbness (LLE) and headaches (Resolved 2 days ago). Negative for syncope and speech difficulty.       Denies bowel/bladder incontinence Denies saddle area paresthesias   Physical Exam Updated Vital Signs BP 120/81   Pulse 72   Temp 97.9 F (36.6 C) (Oral)   Resp 16   Ht 5\' 5"  (1.651 m)  Wt 76.2 kg   LMP 10/06/2012   SpO2 100%   BMI 27.96 kg/m   Physical Exam Constitutional:      General: She is not in acute distress.    Appearance: She is well-developed.  HENT:     Head: Normocephalic and atraumatic.     Right Ear: External ear normal.     Left Ear: External ear normal.     Nose: Nose normal.     Mouth/Throat:     Mouth: Mucous membranes are moist.     Pharynx: Oropharynx is clear.  Eyes:     Extraocular Movements: Extraocular movements intact.     Conjunctiva/sclera: Conjunctivae normal.     Pupils: Pupils are equal, round, and reactive to light.  Neck:     Musculoskeletal: Normal range of motion.     Trachea: Trachea normal. No tracheal deviation.  Cardiovascular:     Rate and Rhythm: Normal rate and regular rhythm.     Pulses: Normal pulses.     Heart sounds: Normal heart sounds.  Pulmonary:     Effort: Pulmonary effort is normal. No respiratory distress.  Abdominal:     Palpations: Abdomen is soft.     Tenderness: There is no abdominal tenderness. There is no guarding or rebound.  Musculoskeletal: Normal range of motion.     Right knee: Normal.     Left knee: Normal.     Right ankle: Normal.     Left ankle: Normal.     Right lower  leg: Normal. She exhibits no tenderness, no swelling and no deformity. No edema.     Left lower leg: She exhibits tenderness. She exhibits no swelling and no deformity. No edema.       Legs:     Right foot: Normal.     Left foot: Normal.  Skin:    General: Skin is warm and dry.     Capillary Refill: Capillary refill takes less than 2 seconds.     Comments: No erythema, fluctuance, induration or increased warmth.  Full range of motion present.  No sign of septic joint, cellulitis, compartment syndrome.  Does not appear clinically as DVT.  Normal appearance of bilateral lower extremities on examination.  Neurological:     General: No focal deficit present.     Mental Status: She is alert and oriented to person, place, and time.     GCS: GCS eye subscore is 4. GCS verbal subscore is 5. GCS motor subscore is 6.     Comments: Mental Status: Alert, oriented, thought content appropriate, able to give a coherent history. Speech fluent without evidence of aphasia. Able to follow 2 step commands without difficulty. Cranial Nerves: II: Peripheral visual fields grossly normal, pupils equal, round, reactive to light III,IV, VI: ptosis not present, extra-ocular motions intact bilaterally V,VII: smile symmetric, eyebrows raise symmetric, facial light touch sensation equal VIII: hearing grossly normal to voice X: uvula elevates symmetrically XI: bilateral shoulder shrug symmetric and strong XII: midline tongue extension without fassiculations Motor: Normal tone. 5/5 strength in upper extremities bilaterally including strong and equal grip strength.  3/5 dorsi and plantar flexion left compared to right. Sensory: Sensation intact to light touch in all extremities. Negative Romberg Cerebellar: normal finger-to-nose with bilateral upper extremities. Normal heel-to -shin balance bilaterally of the lower extremity. No pronator drift.  Gait: steady without assistance CV: distal pulses palpable  throughout  Psychiatric:        Mood and Affect: Mood normal.  Behavior: Behavior normal.    ED Treatments / Results  Labs (all labs ordered are listed, but only abnormal results are displayed) Labs Reviewed  CBC WITH DIFFERENTIAL/PLATELET - Abnormal; Notable for the following components:      Result Value   WBC 3.7 (*)    RBC 5.20 (*)    Neutro Abs 1.6 (*)    All other components within normal limits  BASIC METABOLIC PANEL  PREGNANCY, URINE    EKG None  Radiology Ct Head Wo Contrast  Result Date: 07/24/2018 CLINICAL DATA:  Left leg pain x2 days EXAM: CT HEAD WITHOUT CONTRAST TECHNIQUE: Contiguous axial images were obtained from the base of the skull through the vertex without intravenous contrast. COMPARISON:  09/06/2006 FINDINGS: Brain: No evidence of acute infarction, hemorrhage, hydrocephalus, extra-axial collection or mass lesion/mass effect. Vascular: No hyperdense vessel or unexpected calcification. Skull: Normal. Negative for fracture or focal lesion. Sinuses/Orbits: The visualized paranasal sinuses are essentially clear. The mastoid air cells are unopacified. Other: None. IMPRESSION: Normal head CT. Electronically Signed   By: Julian Hy M.D.   On: 07/24/2018 11:33   US Venous Img Lower Unilateral Left  Result Date: 07/24/2018 CLINICAL DATA:  Left lower extremity pain and edema EXAM: LEFT LOWER EXTREMITY VENOUS DOPPLER ULTRASOUND TECHNIQUE: Gray-scale sonography with graded compression, as well as color Doppler and duplex ultrasound were performed to evaluate the lower extremity deep venous systems from the level of the common femoral vein and including the common femoral, femoral, profunda femoral, popliteal and calf veins including the posterior tibial, peroneal and gastrocnemius veins when visible. The superficial great saphenous vein was also interrogated. Spectral Doppler was utilized to evaluate flow at rest and with distal augmentation maneuvers in the common  femoral, femoral and popliteal veins. COMPARISON:  None. FINDINGS: Contralateral Common Femoral Vein: Respiratory phasicity is normal and symmetric with the symptomatic side. No evidence of thrombus. Normal compressibility. Common Femoral Vein: No evidence of thrombus. Normal compressibility, respiratory phasicity and response to augmentation. Saphenofemoral Junction: No evidence of thrombus. Normal compressibility and flow on color Doppler imaging. Profunda Femoral Vein: No evidence of thrombus. Normal compressibility and flow on color Doppler imaging. Femoral Vein: No evidence of thrombus. Normal compressibility, respiratory phasicity and response to augmentation. Popliteal Vein: No evidence of thrombus. Normal compressibility, respiratory phasicity and response to augmentation. Calf Veins: No evidence of thrombus. Normal compressibility and flow on color Doppler imaging. Superficial Great Saphenous Vein: No evidence of thrombus. Normal compressibility. Venous Reflux:  None. Other Findings:  None. IMPRESSION: No evidence of deep venous thrombosis. Electronically Signed   By: Jerilynn Mages.  Shick M.D.   On: 07/24/2018 12:32    Procedures Procedures (including critical care time)  Medications Ordered in ED Medications  sodium chloride 0.9 % bolus 1,000 mL (0 mLs Intravenous Stopped 07/24/18 1341)  ketorolac (TORADOL) 15 MG/ML injection 15 mg (15 mg Intravenous Given 07/24/18 1259)   Initial Impression / Assessment and Plan / ED Course  I have reviewed the triage vital signs and the nursing notes.  Pertinent labs & imaging results that were available during my care of the patient were reviewed by me and considered in my medical decision making (see chart for details).    11:15 AM: 53y/o female presenting with 2 day history of left lower leg pain/weakness and decreased sensation. History of DVT left leg and states it feels like prior. 3/5 weakness of movement left compared to right. No obvious signs of DVT on  physical exam. Additional history of right  sided headache and dizziness from two days ago has resolved.   Venous US ordered for DVT CT head ordered for hx of headache and weakness  Case discussed with Dr. Maryan Rued, agrees with workup, possibility of sciatica discussed but will look for emergent causes.  Plan of care discussed with patient who agrees. ------------------------ CBC nonacute BMP within normal limits Urine pregnancy negative CT head without acute findings Lower extremity venous ultrasound without evidence of DVT ------------------------- Patient was given 15 mg of Toradol IV, she denies history of CKD or gastric ulcers. Patient reassessed approximately 1.5 hours after Toradol administration, resting comfortably and in no acute distress.  On repeat neuro evaluation patient's strength to her lower extremity is now improved.  Patient states improvement of her pain and endorses improvement of her tingling sensation.  No focal neuro deficits on reexamination.  Suspect today that patient's weakness and tingling secondary to pain, possibility of radiculopathy of the left lower extremity as etiology of patient's symptoms.  Patient does endorse that she has a known herniated disc in her lumbar spine, she denies back pain, injury, saddle area paresthesias or bowel/bladder incontinence.  Will treat as radiculopathy today, prescribed Robaxin and prednisone.  Precautions given with Robaxin for drowsiness.  Additionally patient denies history of diabetes or adverse reaction to prednisone. Patient denies loss of bowel/bladder control or saddle area paresthesias.  No concern for cauda equina.  No fever, night sweats, weight loss, h/o cancer, or IVDU. RICE protocol and pain medicine indicated and discussed with patient.   Rediscussed case with Dr. Maryan Rued, do not suspect CNS etiology of symptoms based on improvement today following Toradol as well as negative CT head with symptoms greater than 48  hours, advises treatment as radiculopathy and PCP follow-up.  Discussed plan of care with patient who agrees with prednisone, Robaxin and PCP follow-up.  At this time there does not appear to be any evidence of an acute emergency medical condition and the patient appears stable for discharge with appropriate outpatient follow up. Diagnosis was discussed with patient who verbalizes understanding of care plan and is agreeable to discharge. I have discussed return precautions with patient who verbalizes understanding of return precautions. Patient strongly encouraged to follow-up with their PCP this week. All questions answered.  Patient's case discussed with Dr. Maryan Rued who agrees with plan to discharge with prednisone and Robaxin and follow-up.   Note: Portions of this report may have been transcribed using voice recognition software. Every effort was made to ensure accuracy; however, inadvertent computerized transcription errors may still be present. Final Clinical Impressions(s) / ED Diagnoses   Final diagnoses:  Sciatica of left side    ED Discharge Orders         Ordered    methocarbamol (ROBAXIN) 500 MG tablet  2 times daily     07/24/18 1433    predniSONE (DELTASONE) 10 MG tablet  Daily     07/24/18 1433           Gari Crown 07/24/18 1458    Blanchie Dessert, MD 07/24/18 2225

## 2018-07-24 NOTE — ED Notes (Signed)
Patient transported to CT 

## 2018-07-24 NOTE — Discharge Instructions (Signed)
You have been diagnosed today with sciatica of the left side.   At this time there does not appear to be the presence of an emergent medical condition, however there is always the potential for conditions to change. Please read and follow the below instructions.  Please return to the Emergency Department immediately for any new or worsening symptoms or if your symptoms do not improve within 3 days. Please be sure to follow up with your Primary Care Provider this week regarding your visit today; please call their office to schedule an appointment even if you are feeling better for a follow-up visit. You may use the medication prednisone as prescribed over the next 5 days to help with nerve inflammation. You may use the muscle relaxer Robaxin as prescribed to help with your symptoms.  Do not drive or operate heavy machinery while taking this medication because it will make you drowsy.  These medications may take a day or 2 before you feel positive effects. You have been given an NSAID today called Toradol, please avoid further NSAID use such as ibuprofen/Motrin over the next 2 days and drink plenty of water.  Get help right away if: You cannot control when you pee (urinate) or poop (have a bowel movement). You have weakness in any of these areas and it gets worse. Lower back. Lower belly (pelvis). Butt (buttocks). Legs. You have redness or swelling of your back. You have a burning feeling when you pee. You have dizziness or vision changes You have fever, nausea/vomiting You have headache Additional or concerning symptoms   Please read the additional information packets attached to your discharge summary.  Do not take your medicine if  develop an itchy rash, swelling in your mouth or lips, or difficulty breathing.

## 2018-09-19 ENCOUNTER — Encounter (HOSPITAL_COMMUNITY): Payer: Managed Care, Other (non HMO)

## 2018-09-19 ENCOUNTER — Encounter: Payer: Self-pay | Admitting: Family

## 2018-09-19 ENCOUNTER — Encounter: Payer: Managed Care, Other (non HMO) | Admitting: Vascular Surgery

## 2018-09-20 DIAGNOSIS — M7711 Lateral epicondylitis, right elbow: Secondary | ICD-10-CM | POA: Insufficient documentation

## 2018-09-20 DIAGNOSIS — M25521 Pain in right elbow: Secondary | ICD-10-CM | POA: Insufficient documentation

## 2018-12-29 ENCOUNTER — Encounter: Payer: Managed Care, Other (non HMO) | Admitting: Internal Medicine

## 2019-01-07 ENCOUNTER — Ambulatory Visit (INDEPENDENT_AMBULATORY_CARE_PROVIDER_SITE_OTHER): Payer: Managed Care, Other (non HMO)

## 2019-01-07 ENCOUNTER — Ambulatory Visit (HOSPITAL_COMMUNITY)
Admission: EM | Admit: 2019-01-07 | Discharge: 2019-01-07 | Disposition: A | Discharge status: Home / Self Care | Payer: Managed Care, Other (non HMO) | Attending: Family Medicine | Admitting: Family Medicine

## 2019-01-07 ENCOUNTER — Encounter (HOSPITAL_COMMUNITY): Payer: Self-pay

## 2019-01-07 ENCOUNTER — Other Ambulatory Visit: Payer: Self-pay

## 2019-01-07 DIAGNOSIS — S62633A Displaced fracture of distal phalanx of left middle finger, initial encounter for closed fracture: Secondary | ICD-10-CM | POA: Diagnosis not present

## 2019-01-07 MED ORDER — HYDROCODONE-ACETAMINOPHEN 5-325 MG PO TABS: 1.0000 | ORAL_TABLET | Freq: Four times a day (QID) | ORAL | 0 refills | Status: DC | PRN

## 2019-01-07 NOTE — Discharge Instructions (Signed)
[facility] Pain medicine as needed See your ortho next week Call int he AM for appt

## 2019-01-07 NOTE — ED Triage Notes (Addendum)
Pt states she injury her left hand trying to catch something from falling off the drawer. Pt states this happened yesterday evening.

## 2019-01-07 NOTE — ED Provider Notes (Addendum)
[place]    CSN: [id number] Arrival date & time: 01/07/19  1114      History   Chief Complaint Chief Complaint  Patient presents with  . Appointment    1140  . Hand Pain    HPI [name] is a 53 y.o. female.   HPI  Patient states that she was trying to open a drawer.  It was difficult.  She had her fingertips hooked over a drawer pull when something started to fall and she had a jerking motion to her hand.  Felt a pop.  Immediate pain.  Is here for follow-up.  Long finger on her left hand is swollen and painful.  Past Medical History:  Diagnosis Date  . ACUTE NASOPHARYNGITIS 05/07/2010  . Allergic rhinitis, cause unspecified 04/19/2011  . Anemia    History of anemia  . Anxiety   . BACK PAIN 02/05/2010  . CELLULITIS 01/13/2010  . CHEST PAIN UNSPECIFIED 08/23/2008  . CONSTIPATION, CHRONIC 04/09/2007  . DEEP VENOUS THROMBOPHLEBITIS, LEFT, LEG, HX OF 04/09/2007  . [place] DISEASE, 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  . DIZZINESS 08/15/2008  . DYSPNEA 05/20/2008  . FATIGUE 05/20/2008  . KNEE PAIN, LEFT 09/11/2007  . Left sided sciatica 04/19/2011  . MIGRAINE HEADACHE 04/09/2007  . Ovarian cyst   . Palpitations 05/20/2008  . Pancreatic lesion   . PONV (postoperative nausea and vomiting)   . PREMATURE VENTRICULAR CONTRACTIONS 08/23/2008  . RASH-NONVESICULAR 01/17/2008  . RUQ PAIN 02/23/2010  . SICKLE CELL TRAIT 04/09/2007  . Tension headache 04/09/2007  . VARICOSE VEINS LOWER EXTREMITIES W/OTH COMPS 09/11/2007    Patient Active Problem List   Diagnosis Date Noted  . Venous (peripheral) insufficiency 12/18/2017  . Nausea without vomiting 05/27/2014  . Pelvic pain 12/27/2013  . Adenomyosis 10/24/2012  . Facial weakness 10/04/2012  . Tongue fasciculation 10/04/2012  . Tremor 10/04/2011  . Left facial pain 01/13/2011  . Encounter for well adult exam with abnormal findings 01/13/2011  . 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  . BACK PAIN 02/05/2010  . PREMATURE VENTRICULAR  CONTRACTIONS 08/23/2008  . Chest pain 08/23/2008  . DIZZINESS 08/15/2008  . FATIGUE 05/20/2008  . Palpitations 05/20/2008  . TONSILLITIS, CHRONIC 03/13/2008  . KNEE PAIN, LEFT 09/11/2007  . [place] DISEASE, 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  . SICKLE CELL TRAIT 04/09/2007  . Tension headache 04/09/2007  . MIGRAINE HEADACHE 04/09/2007  . CONSTIPATION, CHRONIC 04/09/2007  . DEEP VENOUS THROMBOPHLEBITIS, LEFT, LEG, HX OF 04/09/2007  . ANXIETY 03/07/2007    Past Surgical History:  Procedure Laterality Date  . CESAREAN SECTION  2004  . LAPAROSCOPIC ASSISTED VAGINAL HYSTERECTOMY N/A 10/23/2012   Procedure: LAPAROSCOPIC ASSISTED VAGINAL HYSTERECTOMY;  Surgeon: Margarette Asal, MD;  Location: [facility];  Service: Gynecology;  Laterality: N/A;  . TONSILLECTOMY  07/18/2009  . TUBAL LIGATION  01/06/2012   with cyst removal    OB History    Gravida  3   Para  3   Term      Preterm      AB      Living  3     SAB      TAB      Ectopic      Multiple      Live Births               Home Medications    Prior to Admission medications   Medication Sig Start Date End Date Taking? Authorizing Provider  HYDROcodone-acetaminophen (NORCO/VICODIN) 5-325 MG  tablet Take 1-2 tablets by mouth every 6 (six) hours as needed. 01/07/19   Raylene Everts, MD  methocarbamol (ROBAXIN) 500 MG tablet Take 1 tablet (500 mg total) by mouth 2 (two) times daily. 07/24/18   Deliah Boston, PA-C  valACYclovir (VALTREX) 500 MG tablet  12/07/17   [provider]    Family History Family History  Problem Relation Age of Onset  . Heart disease Mother   . Hypertension Mother   . Heart attack Father   . Pancreatitis Father   . Liver cancer Maternal Aunt   . Brain cancer Maternal Aunt   . Lung cancer Maternal Aunt   . Colon cancer Neg Hx     Social History Social History   Tobacco Use  . Smoking status: Never Smoker  . Smokeless tobacco: Never Used  Substance Use Topics  .  Alcohol use: Yes    Comment: socially  . Drug use: No     Allergies   Morphine and related   Review of Systems Review of Systems  Constitutional: Negative for chills and fever.  HENT: Negative for ear pain and sore throat.   Eyes: Negative for pain and visual disturbance.  Respiratory: Negative for cough and shortness of breath.   Cardiovascular: Negative for chest pain and palpitations.  Gastrointestinal: Negative for abdominal pain and vomiting.  Genitourinary: Negative for dysuria and hematuria.  Musculoskeletal: Positive for arthralgias. Negative for back pain.  Skin: Negative for color change and rash.  Neurological: Negative for seizures and syncope.  All other systems reviewed and are negative.    Physical Exam Triage Vital Signs ED Triage Vitals  Enc Vitals Group     BP 01/07/19 1144 127/84     Pulse Rate 01/07/19 1144 100     Resp 01/07/19 1144 16     Temp 01/07/19 1144 97.8 F (36.6 C)     Temp Source 01/07/19 1144 Oral     SpO2 01/07/19 1144 100 %     Weight 01/07/19 1143 170 lb (77.1 kg)     Height --      Head Circumference --      Peak Flow --      Pain Score 01/07/19 1143 9     Pain Loc --      Pain Edu? --      Excl. in [place]? --    No data found.  Updated Vital Signs BP 127/84 (BP Location: Right Arm)   Pulse 100   Temp 97.8 F (36.6 C) (Oral)   Resp 16   Wt 77.1 kg   LMP 10/06/2012   SpO2 100%   BMI 28.29 kg/m      Physical Exam Constitutional:      General: She is not in acute distress.    Appearance: She is well-developed.  HENT:     Head: Normocephalic and atraumatic.  Eyes:     Conjunctiva/sclera: Conjunctivae normal.     Pupils: Pupils are equal, round, and reactive to light.  Neck:     Musculoskeletal: Normal range of motion.  Cardiovascular:     Rate and Rhythm: Normal rate.  Pulmonary:     Effort: Pulmonary effort is normal. No respiratory distress.  Abdominal:     General: There is no distension.     Palpations:  Abdomen is soft.  Musculoskeletal: Normal range of motion.     Comments: Left hand is examined.  There is swelling and tenderness localized to the DIP joint of the  long finger with tenderness over the dorsal aspect.  Skin:    General: Skin is warm and dry.  Neurological:     General: No focal deficit present.     Mental Status: She is alert.  Psychiatric:        Mood and Affect: Mood normal.      UC Treatments / Results  Labs (all labs ordered are listed, but only abnormal results are displayed) Labs Reviewed - No data to display  EKG None  Radiology Dg Hand Complete Left  Result Date: 01/07/2019 CLINICAL DATA:  Injured hand yesterday. EXAM: LEFT HAND - COMPLETE 3+ VIEW COMPARISON:  None. FINDINGS: There is a sizable dorsal plate avulsion fracture involving the distal phalanx of the long finger. This involves approximately 50% of the articular surface and the dorsal displacement is approximately 1.8 mm. The other joint spaces are maintained. No other significant bony findings. IMPRESSION: Sizable dorsal plate avulsion fracture involving the distal phalanx of the third finger. Electronically Signed   By: Marijo Sanes M.D.   On: 01/07/2019 12:33    Procedures Splint Application  Date/Time: 01/07/2019 1:15 PM Performed by: Raylene Everts, MD Authorized by: Raylene Everts, MD   Consent:    Consent obtained:  Verbal   Consent given by:  Patient   Risks discussed:  Pain   Alternatives discussed:  No treatment Pre-procedure details:    Sensation:  Normal Procedure details:    Laterality:  Left   Location:  Finger   Finger:  L long finger   Cast type:  Finger   Splint type:  Finger   Supplies:  Aluminum splint Post-procedure details:    Pain:  Unchanged   Sensation:  Normal   Patient tolerance of procedure:  Tolerated well, no immediate complications   (including critical care time)  Medications Ordered in UC Medications - No data to display  Initial  Impression / Assessment and Plan / UC Course  I have reviewed the triage vital signs and the nursing notes.  Pertinent labs & imaging results that were available during my care of the patient were reviewed by me and considered in my medical decision making (see chart for details).    Reviewed mallet finger.  Importance of splinting.  Splint is applied in slight extension.  Do not remove until seen by orthopedist. Final Clinical Impressions(s) / UC Diagnoses   Final diagnoses:  Closed displaced fracture of distal phalanx of left middle finger, initial encounter     Discharge Instructions     Ice Elevate Pain medicine as needed See your ortho next week Call int he AM for appt   ED Prescriptions    Medication Sig Dispense Auth. Provider   HYDROcodone-acetaminophen (NORCO/VICODIN) 5-325 MG tablet Take 1-2 tablets by mouth every 6 (six) hours as needed. 10 tablet Raylene Everts, MD     Controlled Substance Prescriptions [place] Controlled Substance Registry consulted? Yes, I have consulted the [place] Controlled Substances Registry for this patient, and feel the risk/benefit ratio today is favorable for proceeding with this prescription for a controlled substance.   Raylene Everts, MD 01/07/19 1314    Raylene Everts, MD 01/07/19 1315

## 2019-02-02 ENCOUNTER — Other Ambulatory Visit: Payer: Self-pay

## 2019-02-02 ENCOUNTER — Other Ambulatory Visit (INDEPENDENT_AMBULATORY_CARE_PROVIDER_SITE_OTHER): Payer: Managed Care, Other (non HMO)

## 2019-02-02 ENCOUNTER — Encounter: Payer: Self-pay | Admitting: Internal Medicine

## 2019-02-02 ENCOUNTER — Ambulatory Visit (INDEPENDENT_AMBULATORY_CARE_PROVIDER_SITE_OTHER): Payer: Managed Care, Other (non HMO) | Admitting: Internal Medicine

## 2019-02-02 VITALS — BP 118/80 | HR 91 | Temp 97.9°F | Ht 65.0 in | Wt 176.0 lb

## 2019-02-02 DIAGNOSIS — E611 Iron deficiency: Secondary | ICD-10-CM | POA: Diagnosis not present

## 2019-02-02 DIAGNOSIS — Z23 Encounter for immunization: Secondary | ICD-10-CM | POA: Diagnosis not present

## 2019-02-02 DIAGNOSIS — E538 Deficiency of other specified B group vitamins: Secondary | ICD-10-CM | POA: Diagnosis not present

## 2019-02-02 DIAGNOSIS — E559 Vitamin D deficiency, unspecified: Secondary | ICD-10-CM | POA: Diagnosis not present

## 2019-02-02 DIAGNOSIS — Z0000 Encounter for general adult medical examination without abnormal findings: Secondary | ICD-10-CM

## 2019-02-02 LAB — LIPID PANEL
Cholesterol: 155 mg/dL (ref 0–200)
HDL: 65.8 mg/dL (ref >39.00)
LDL Cholesterol: 82 mg/dL (ref 0–99)
NonHDL: 88.93
Total CHOL/HDL Ratio: 2
Triglycerides: 34 mg/dL (ref 0.0–149.0)
VLDL: 6.8 mg/dL (ref 0.0–40.0)

## 2019-02-02 LAB — URINALYSIS, ROUTINE W REFLEX MICROSCOPIC
Bilirubin Urine: NEGATIVE
Ketones, ur: NEGATIVE
Leukocytes,Ua: NEGATIVE
Nitrite: NEGATIVE
Specific Gravity, Urine: 1.025 (ref 1.000–1.030)
Total Protein, Urine: NEGATIVE
Urine Glucose: NEGATIVE
Urobilinogen, UA: 0.2 (ref 0.0–1.0)
pH: 5.5 (ref 5.0–8.0)

## 2019-02-02 LAB — BASIC METABOLIC PANEL
BUN: 12 mg/dL (ref 6–23)
CO2: 29 mEq/L (ref 19–32)
Calcium: 9.5 mg/dL (ref 8.4–10.5)
Chloride: 104 mEq/L (ref 96–112)
Creatinine, Ser: 0.9 mg/dL (ref 0.40–1.20)
GFR: 79.78 mL/min (ref >60.00)
Glucose, Bld: 88 mg/dL (ref 70–99)
Potassium: 4.1 mEq/L (ref 3.5–5.1)
Sodium: 140 mEq/L (ref 135–145)

## 2019-02-02 LAB — IBC PANEL
Iron: 93 ug/dL (ref 42–145)
Saturation Ratios: 27 % (ref 20.0–50.0)
Transferrin: 246 mg/dL (ref 212.0–360.0)

## 2019-02-02 LAB — CBC WITH DIFFERENTIAL/PLATELET
Basophils Absolute: 0 10*3/uL (ref 0.0–0.1)
Basophils Relative: 1.1 % (ref 0.0–3.0)
Eosinophils Absolute: 0.1 10*3/uL (ref 0.0–0.7)
Eosinophils Relative: 2.1 % (ref 0.0–5.0)
HCT: 42.9 % (ref 36.0–46.0)
Hemoglobin: 14.4 g/dL (ref 12.0–15.0)
Lymphocytes Relative: 39.7 % (ref 12.0–46.0)
Lymphs Abs: 1.6 10*3/uL (ref 0.7–4.0)
MCHC: 33.5 g/dL (ref 30.0–36.0)
MCV: 85.6 fl (ref 78.0–100.0)
Monocytes Absolute: 0.3 10*3/uL (ref 0.1–1.0)
Monocytes Relative: 8.1 % (ref 3.0–12.0)
Neutro Abs: 2 10*3/uL (ref 1.4–7.7)
Neutrophils Relative %: 49 % (ref 43.0–77.0)
Platelets: 243 10*3/uL (ref 150.0–400.0)
RBC: 5.01 Mil/uL (ref 3.87–5.11)
RDW: 13.1 % (ref 11.5–15.5)
WBC: 4 10*3/uL (ref 4.0–10.5)

## 2019-02-02 LAB — HEPATIC FUNCTION PANEL
ALT: 22 U/L (ref 0–35)
AST: 20 U/L (ref 0–37)
Albumin: 4.6 g/dL (ref 3.5–5.2)
Alkaline Phosphatase: 46 U/L (ref 39–117)
Bilirubin, Direct: 0.1 mg/dL (ref 0.0–0.3)
Total Bilirubin: 0.5 mg/dL (ref 0.2–1.2)
Total Protein: 7.4 g/dL (ref 6.0–8.3)

## 2019-02-02 LAB — TSH: TSH: 0.78 u[IU]/mL (ref 0.35–4.50)

## 2019-02-02 LAB — VITAMIN B12: Vitamin B-12: 297 pg/mL (ref 211–911)

## 2019-02-02 LAB — VITAMIN D 25 HYDROXY (VIT D DEFICIENCY, FRACTURES): VITD: 34.64 ng/mL (ref 30.00–100.00)

## 2019-02-02 NOTE — Patient Instructions (Addendum)

## 2019-02-02 NOTE — Progress Notes (Signed)
Subjective:    Patient ID: [name], female    DOB: [birth year], 53 y.o.   MRN: [id number]  HPI  Here for wellness and f/u;  Overall doing ok;  Pt denies Chest pain, worsening SOB, DOE, wheezing, orthopnea, PND, worsening LE edema, palpitations, dizziness or syncope.  Pt denies neurological change such as new headache, facial or extremity weakness.  Pt denies polydipsia, polyuria, or low sugar symptoms. Pt states overall good compliance with treatment and medications, good tolerability, and has been trying to follow appropriate diet.  Pt denies worsening depressive symptoms, suicidal ideation or panic. No fever, night sweats, wt loss, loss of appetite, or other constitutional symptoms.  Pt states good ability with ADL's, has low fall risk, home safety reviewed and adequate, no other significant changes in hearing or vision, and only occasionally active with exercise. Has mammog and pap planned for sept 2.   Wt Readings from Last 3 Encounters:  02/02/19 176 lb (79.8 kg)  01/07/19 170 lb (77.1 kg)  07/24/18 168 lb (76.2 kg)   Past Medical History:  Diagnosis Date  . ACUTE NASOPHARYNGITIS 05/07/2010  . Allergic rhinitis, cause unspecified 04/19/2011  . Anemia    History of anemia  . Anxiety   . BACK PAIN 02/05/2010  . CELLULITIS 01/13/2010  . CHEST PAIN UNSPECIFIED 08/23/2008  . CONSTIPATION, CHRONIC 04/09/2007  . DEEP VENOUS THROMBOPHLEBITIS, LEFT, LEG, HX OF 04/09/2007  . [place] DISEASE, 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  . DIZZINESS 08/15/2008  . DYSPNEA 05/20/2008  . FATIGUE 05/20/2008  . KNEE PAIN, LEFT 09/11/2007  . Left sided sciatica 04/19/2011  . MIGRAINE HEADACHE 04/09/2007  . Ovarian cyst   . Palpitations 05/20/2008  . Pancreatic lesion   . PONV (postoperative nausea and vomiting)   . PREMATURE VENTRICULAR CONTRACTIONS 08/23/2008  . RASH-NONVESICULAR 01/17/2008  . RUQ PAIN 02/23/2010  . SICKLE CELL TRAIT 04/09/2007  . Tension headache 04/09/2007  . VARICOSE VEINS LOWER EXTREMITIES W/OTH  COMPS 09/11/2007   Past Surgical History:  Procedure Laterality Date  . CESAREAN SECTION  2004  . LAPAROSCOPIC ASSISTED VAGINAL HYSTERECTOMY N/A 10/23/2012   Procedure: LAPAROSCOPIC ASSISTED VAGINAL HYSTERECTOMY;  Surgeon: Margarette Asal, MD;  Location: [facility];  Service: Gynecology;  Laterality: N/A;  . TONSILLECTOMY  07/18/2009  . TUBAL LIGATION  01/06/2012   with cyst removal    reports that she has never smoked. She has never used smokeless tobacco. She reports current alcohol use. She reports that she does not use drugs. family history includes Brain cancer in her maternal aunt; Heart attack in her father; Heart disease in her mother; Hypertension in her mother; Liver cancer in her maternal aunt; Lung cancer in her maternal aunt; Pancreatitis in her father. Allergies  Allergen Reactions  . Morphine And Related Rash   Current Outpatient Medications on File Prior to Visit  Medication Sig Dispense Refill  . HYDROcodone-acetaminophen (NORCO/VICODIN) 5-325 MG tablet Take 1-2 tablets by mouth every 6 (six) hours as needed. 10 tablet 0   No current facility-administered medications on file prior to visit.    Review of Systems Constitutional: Negative for other unusual diaphoresis, sweats, appetite or weight changes HENT: Negative for other worsening hearing loss, ear pain, facial swelling, mouth sores or neck stiffness.   Eyes: Negative for other worsening pain, redness or other visual disturbance.  Respiratory: Negative for other stridor or swelling Cardiovascular: Negative for other palpitations or other chest pain  Gastrointestinal: Negative for worsening diarrhea or loose stools, blood in stool, distention  or other pain Genitourinary: Negative for hematuria, flank pain or other change in urine volume.  Musculoskeletal: Negative for myalgias or other joint swelling.  Skin: Negative for other color change, or other wound or worsening drainage.  Neurological: Negative for other syncope  or numbness. Hematological: Negative for other adenopathy or swelling Psychiatric/Behavioral: Negative for hallucinations, other worsening agitation, SI, self-injury, or new decreased concentration All other system neg per pt    Objective:   Physical Exam BP 118/80 (BP Location: Left Arm, Patient Position: Sitting, Cuff Size: Normal)   Pulse 91   Temp 97.9 F (36.6 C) (Oral)   Ht 5\' 5"  (1.651 m)   Wt 176 lb (79.8 kg)   LMP 10/06/2012   SpO2 99%   BMI 29.29 kg/m  VS noted,  Constitutional: Pt is oriented to person, place, and time. Appears well-developed and well-nourished, in no significant distress and comfortable Head: Normocephalic and atraumatic  Eyes: Conjunctivae and EOM are normal. Pupils are equal, round, and reactive to light Right Ear: External ear normal without discharge Left Ear: External ear normal without discharge Nose: Nose without discharge or deformity Mouth/Throat: Oropharynx is without other ulcerations and moist  Neck: Normal range of motion. Neck supple. No JVD present. No tracheal deviation present or significant neck LA or mass Cardiovascular: Normal rate, regular rhythm, normal heart sounds and intact distal pulses.   Pulmonary/Chest: WOB normal and breath sounds without rales or wheezing  Abdominal: Soft. Bowel sounds are normal. NT. No HSM  Musculoskeletal: Normal range of motion. Exhibits no edema Lymphadenopathy: Has no other cervical adenopathy.  Neurological: Pt is alert and oriented to person, place, and time. Pt has normal reflexes. No cranial nerve deficit. Motor grossly intact, Gait intact Skin: Skin is warm and dry. No rash noted or new ulcerations Psychiatric:  Has normal mood and affect. Behavior is normal without agitation No other exam findings Lab Results  Component Value Date   WBC 4.0 02/02/2019   HGB 14.4 02/02/2019   HCT 42.9 02/02/2019   PLT 243.0 02/02/2019   GLUCOSE 88 02/02/2019   CHOL 155 02/02/2019   TRIG 34.0 02/02/2019    HDL 65.80 02/02/2019   LDLCALC 82 02/02/2019   ALT 22 02/02/2019   AST 20 02/02/2019   NA 140 02/02/2019   K 4.1 02/02/2019   CL 104 02/02/2019   CREATININE 0.90 02/02/2019   BUN 12 02/02/2019   CO2 29 02/02/2019   TSH 0.78 02/02/2019   INR 1.0 03/01/2007      Assessment & Plan:

## 2019-02-03 ENCOUNTER — Encounter: Payer: Self-pay | Admitting: Internal Medicine

## 2019-02-03 NOTE — Assessment & Plan Note (Signed)

## 2019-03-20 HISTORY — PX: FINGER FRACTURE SURGERY: SHX638

## 2019-05-09 ENCOUNTER — Other Ambulatory Visit: Payer: Self-pay

## 2019-05-09 DIAGNOSIS — Z20822 Contact with and (suspected) exposure to covid-19: Secondary | ICD-10-CM

## 2019-05-11 ENCOUNTER — Encounter: Payer: Self-pay | Admitting: Internal Medicine

## 2019-05-11 LAB — NOVEL CORONAVIRUS, NAA: SARS-CoV-2, NAA: NOT DETECTED

## 2019-05-23 DIAGNOSIS — Z8616 Personal history of COVID-19: Secondary | ICD-10-CM

## 2019-05-23 HISTORY — DX: Personal history of COVID-19: Z86.16

## 2019-05-24 ENCOUNTER — Ambulatory Visit (INDEPENDENT_AMBULATORY_CARE_PROVIDER_SITE_OTHER): Payer: Managed Care, Other (non HMO) | Admitting: Internal Medicine

## 2019-05-24 ENCOUNTER — Other Ambulatory Visit: Payer: Self-pay

## 2019-05-24 ENCOUNTER — Encounter: Payer: Self-pay | Admitting: Internal Medicine

## 2019-05-24 DIAGNOSIS — Z20822 Contact with and (suspected) exposure to covid-19: Secondary | ICD-10-CM

## 2019-05-24 DIAGNOSIS — F411 Generalized anxiety disorder: Secondary | ICD-10-CM

## 2019-05-24 DIAGNOSIS — G44209 Tension-type headache, unspecified, not intractable: Secondary | ICD-10-CM | POA: Diagnosis not present

## 2019-05-24 DIAGNOSIS — J069 Acute upper respiratory infection, unspecified: Secondary | ICD-10-CM | POA: Insufficient documentation

## 2019-05-24 MED ORDER — ONDANSETRON HCL 4 MG PO TABS: 4.0000 mg | ORAL_TABLET | Freq: Three times a day (TID) | ORAL | 0 refills | Status: DC | PRN

## 2019-05-24 MED ORDER — CEPHALEXIN 500 MG PO CAPS: 500.0000 mg | ORAL_CAPSULE | Freq: Three times a day (TID) | ORAL | 0 refills | Status: AC

## 2019-05-24 NOTE — Addendum Note (Signed)
Addended by: Biagio Borg on: 05/24/2019 11:49 AM   Modules accepted: Orders

## 2019-05-24 NOTE — Progress Notes (Signed)
Patient ID: [name], female   DOB: 1966/03/18, 53 y.o.   MRN: [id number]  Virtual Visit via Video Note  I connected with Roberto Scales on 05/24/19 at 11:20 AM EST by a video enabled telemedicine application and verified that I am speaking with the correct person using two identifiers.  Location: Patient: at home Provider: at office   I discussed the limitations of evaluation and management by telemedicine and the availability of in person appointments. The patient expressed understanding and agreed to proceed.  History of Present Illness:  Here with 2-3 days acute onset fever, facial pain, pressure, headache, general weakness and malaise, and greenish d/c, with mild ST and cough, but pt denies chest pain, wheezing, increased sob or doe, orthopnea, PND, increased LE swelling, palpitations, dizziness or syncope. Pt denies new neurological symptoms such as new headache, or facial or extremity weakness or numbness   Pt denies polydipsia, polyuria.  No know COVID exposure or other sick contacts.  Did have COVID testing neg about 2.5 wks ago.  Denies worsening depressive symptoms, suicidal ideation, or panic Lab Results  Component Value Date   WBC 4.0 02/02/2019   HGB 14.4 02/02/2019   HCT 42.9 02/02/2019   PLT 243.0 02/02/2019   GLUCOSE 88 02/02/2019   CHOL 155 02/02/2019   TRIG 34.0 02/02/2019   HDL 65.80 02/02/2019   LDLCALC 82 02/02/2019   ALT 22 02/02/2019   AST 20 02/02/2019   NA 140 02/02/2019   K 4.1 02/02/2019   CL 104 02/02/2019   CREATININE 0.90 02/02/2019   BUN 12 02/02/2019   CO2 29 02/02/2019   TSH 0.78 02/02/2019   INR 1.0 03/01/2007   Observations/Objective: Alert, NAD, appropriate mood and affect, resps normal, cn 2-12 intact, moves all 4s, no visible rash or swelling Lab Results  Component Value Date   WBC 4.0 02/02/2019   HGB 14.4 02/02/2019   HCT 42.9 02/02/2019   PLT 243.0 02/02/2019   GLUCOSE 88 02/02/2019   CHOL 155 02/02/2019   TRIG 34.0 02/02/2019    HDL 65.80 02/02/2019   LDLCALC 82 02/02/2019   ALT 22 02/02/2019   AST 20 02/02/2019   NA 140 02/02/2019   K 4.1 02/02/2019   CL 104 02/02/2019   CREATININE 0.90 02/02/2019   BUN 12 02/02/2019   CO2 29 02/02/2019   TSH 0.78 02/02/2019   INR 1.0 03/01/2007   Assessment and Plan: See notes  Follow Up Instructions: See notes   I discussed the assessment and treatment plan with the patient. The patient was provided an opportunity to ask questions and all were answered. The patient agreed with the plan and demonstrated an understanding of the instructions.   The patient was advised to call back or seek an in-person evaluation if the symptoms worsen or if the condition fails to improve as anticipated.   Cathlean Cower, MD

## 2019-05-24 NOTE — Assessment & Plan Note (Signed)
stable overall by history and exam, recent data reviewed with pt, and pt to continue medical treatment as before,  to f/u any worsening symptoms or concerns  

## 2019-05-24 NOTE — Assessment & Plan Note (Signed)
[place] for advil prn,  to f/u any worsening symptoms or concerns

## 2019-05-24 NOTE — Patient Instructions (Signed)
Please take all new medication as prescribed - the antibiotic  Please also go for COVID testing  Please continue all other medications as before, and refills have been done if requested.  Please have the pharmacy call with any other refills you may need.  Please continue your efforts at being more active, low cholesterol diet, and weight control.  Please keep your appointments with your specialists as you may have planned

## 2019-05-24 NOTE — Assessment & Plan Note (Signed)
Mild to mod, for antibx course,  to f/u any worsening symptoms or concerns, also for COVID testing 

## 2019-05-26 LAB — NOVEL CORONAVIRUS, NAA: SARS-CoV-2, NAA: DETECTED — AB

## 2019-05-27 ENCOUNTER — Encounter: Payer: Self-pay | Admitting: Internal Medicine

## 2019-12-03 ENCOUNTER — Telehealth: Payer: Self-pay | Admitting: Nurse Practitioner

## 2019-12-03 NOTE — Telephone Encounter (Signed)
Received a referral from Dr. Matthew Saras for [name] to be seen in the high risk clinic. She returned my call and has been scheduled to see [name] on 5/20 at 945pm. Pt aware to arrive 15 minutes early.

## 2019-12-06 ENCOUNTER — Telehealth: Payer: Self-pay

## 2019-12-06 ENCOUNTER — Encounter: Payer: Self-pay | Admitting: Nurse Practitioner

## 2019-12-06 ENCOUNTER — Inpatient Hospital Stay: Payer: Managed Care, Other (non HMO) | Attending: Nurse Practitioner | Admitting: Nurse Practitioner

## 2019-12-06 ENCOUNTER — Other Ambulatory Visit: Payer: Self-pay

## 2019-12-06 ENCOUNTER — Inpatient Hospital Stay: Payer: Managed Care, Other (non HMO)

## 2019-12-06 VITALS — BP 128/90 | HR 74 | Temp 97.3°F | Resp 17 | Ht 65.0 in | Wt 174.2 lb

## 2019-12-06 DIAGNOSIS — Z1509 Genetic susceptibility to other malignant neoplasm: Secondary | ICD-10-CM | POA: Diagnosis present

## 2019-12-06 DIAGNOSIS — Z803 Family history of malignant neoplasm of breast: Secondary | ICD-10-CM | POA: Diagnosis not present

## 2019-12-06 DIAGNOSIS — R1011 Right upper quadrant pain: Secondary | ICD-10-CM | POA: Insufficient documentation

## 2019-12-06 DIAGNOSIS — G8929 Other chronic pain: Secondary | ICD-10-CM | POA: Diagnosis not present

## 2019-12-06 DIAGNOSIS — Z86718 Personal history of other venous thrombosis and embolism: Secondary | ICD-10-CM

## 2019-12-06 DIAGNOSIS — Z8041 Family history of malignant neoplasm of ovary: Secondary | ICD-10-CM | POA: Diagnosis not present

## 2019-12-06 DIAGNOSIS — D573 Sickle-cell trait: Secondary | ICD-10-CM | POA: Diagnosis not present

## 2019-12-06 LAB — HEPATIC FUNCTION PANEL
ALT: 22 U/L (ref 0–44)
AST: 22 U/L (ref 15–41)
Albumin: 4.8 g/dL (ref 3.5–5.0)
Alkaline Phosphatase: 55 U/L (ref 38–126)
Bilirubin, Direct: 0.1 mg/dL (ref 0.0–0.2)
Indirect Bilirubin: 0.3 mg/dL (ref 0.3–0.9)
Total Bilirubin: 0.4 mg/dL (ref 0.3–1.2)
Total Protein: 7.7 g/dL (ref 6.5–8.1)

## 2019-12-06 NOTE — Progress Notes (Addendum)
[name]  [phone] Fax:([phone] [facility] Note   Patient Care Team: Biagio Borg, MD as PCP - General 12/06/2019  CHIEF COMPLAINTS/PURPOSE OF CONSULTATION:  Lynch Syndrome, referred to High risk clinic, by Ob/Gyn Dr. Matthew Saras of [facility]   HISTORY OF PRESENTING ILLNESS:  [name] 54 y.o. female is here because of high risk for cancer. She underwent genetic testing per her ObGyn Dr. 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 She has no personal previous history of cancer. She is up to date on mammogram, no previous abnormalities or breast biopsies. Last colonoscopy 2016 by Dr. Hilarie Fredrickson was unremarkable, no biopsies were taken. She's not sure when last PAP was done. She is s/p hysterectomy in 2014. She is perimenopausal, having hot flashes mainly. Not on HRT. Her mother died of ovarian cancer which prompted her genetic testing, 3 aunts had bran, liver, and lung cancers. A maternal cousin had breast cancer.   Her PMH is significant for DVT age 82 while on contraceptive. She was treated with lovenox then 6 months or less with coumadin. She has not had recurrent thrombosis. Denies HTN, CAD, CKD, or DM. Socially, [name] is married with 3 children ages 52, 21, 61. She is considering getting her children tested. She lives with her husband and 2 of her children. She does not work outside the home. She is independent of ADLs. She denies former or current tobacco or drug use. She drinks alcohol socially.   Today, she presents with her husband. She feels well in general. Denies changes to her appetite or weight. She is tired and having hot flashes, remains functional. She is active with busy lifestyle, not much other exercise. Denies recent fever, chills, cough, chest pain, dyspnea, leg edema, change in bowel habits, GI/GYN bleeding or other new concerns. She has  intermittent RUQ pain has been going on several years, the reason she had a colonoscopy in 2016.   MEDICAL HISTORY:  Past Medical History:  Diagnosis Date  . ACUTE NASOPHARYNGITIS 05/07/2010  . Allergic rhinitis, cause unspecified 04/19/2011  . Anemia    History of anemia  . Anxiety   . BACK PAIN 02/05/2010  . CELLULITIS 01/13/2010  . CHEST PAIN UNSPECIFIED 08/23/2008  . CONSTIPATION, CHRONIC 04/09/2007  . DEEP VENOUS THROMBOPHLEBITIS, LEFT, LEG, HX OF 04/09/2007  . [place] DISEASE, 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  . DIZZINESS 08/15/2008  . DYSPNEA 05/20/2008  . FATIGUE 05/20/2008  . KNEE PAIN, LEFT 09/11/2007  . Left sided sciatica 04/19/2011  . MIGRAINE HEADACHE 04/09/2007  . Ovarian cyst   . Palpitations 05/20/2008  . Pancreatic lesion   . PONV (postoperative nausea and vomiting)   . PREMATURE VENTRICULAR CONTRACTIONS 08/23/2008  . RASH-NONVESICULAR 01/17/2008  . RUQ PAIN 02/23/2010  . SICKLE CELL TRAIT 04/09/2007  . Tension headache 04/09/2007  . VARICOSE VEINS LOWER EXTREMITIES W/OTH COMPS 09/11/2007    SURGICAL HISTORY: Past Surgical History:  Procedure Laterality Date  . CESAREAN SECTION  2004  . LAPAROSCOPIC ASSISTED VAGINAL HYSTERECTOMY N/A 10/23/2012   Procedure: LAPAROSCOPIC ASSISTED VAGINAL HYSTERECTOMY;  Surgeon: Margarette Asal, MD;  Location: [facility];  Service: Gynecology;  Laterality: N/A;  . TONSILLECTOMY  07/18/2009  . TUBAL LIGATION  01/06/2012   with cyst removal    SOCIAL HISTORY: Social History   Socioeconomic History  . Marital status: Married    Spouse name: Not on file  . Number of children: 3  .  Years of education: Not on file  . Highest education level: Not on file  Occupational History  . 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 Occupation: currently does not work outside the home (12/06/19)  Tobacco Use  . Smoking status: Never Smoker  . Smokeless tobacco: Never Used  Substance and Sexual Activity  . Alcohol  use: Yes    Comment: socially  . Drug use: No  . Sexual activity: Not on file  Other Topics Concern  . Not on file  Social History Narrative  . Not on file   Social Determinants of Health   Financial Resource Strain:   . Difficulty of Paying Living Expenses:   Food Insecurity:   . Worried About Charity fundraiser in the Last Year:   . Arboriculturist in the Last Year:   Transportation Needs:   . Film/video editor (Medical):   Marland Kitchen Lack of Transportation (Non-Medical):   Physical Activity:   . Days of Exercise per Week:   . Minutes of Exercise per Session:   Stress:   . Feeling of Stress :   Social Connections:   . Frequency of Communication with Friends and Family:   . Frequency of Social Gatherings with Friends and Family:   . Attends Religious Services:   . Active Member of Clubs or Organizations:   . Attends Archivist Meetings:   Marland Kitchen Marital Status:   Intimate Partner Violence:   . Fear of Current or Ex-Partner:   . Emotionally Abused:   Marland Kitchen Physically Abused:   . Sexually Abused:     FAMILY HISTORY: Family History  Problem Relation Age of Onset  . Heart disease Mother   . Hypertension Mother   . Cancer Mother        ovarian   . Heart attack Father   . Pancreatitis Father   . Liver cancer Maternal Aunt   . Brain cancer Maternal Aunt   . Lung cancer Maternal Aunt   . Cancer Cousin        breast  . Colon cancer Neg Hx     ALLERGIES:  is allergic to morphine and related.  MEDICATIONS:  Current Outpatient Medications  Medication Sig Dispense Refill  . HYDROcodone-acetaminophen (NORCO/VICODIN) 5-325 MG tablet Take 1-2 tablets by mouth every 6 (six) hours as needed. 10 tablet 0  . ondansetron (ZOFRAN) 4 MG tablet Take 1 tablet (4 mg total) by mouth every 8 (eight) hours as needed for nausea or vomiting. 20 tablet 0   No current facility-administered medications for this visit.    REVIEW OF SYSTEMS:   Constitutional: Denies fevers, chills,  abnormal night sweats, decreased weight or appetite (+) fatigue  Eyes: Denies blurriness of vision, double vision or watery eyes Ears, nose, mouth, throat, and face: Denies mucositis or sore throat Respiratory: Denies cough, dyspnea or wheezes Cardiovascular: Denies palpitation, chest discomfort or lower extremity swelling Gastrointestinal:  Denies nausea, vomiting, constipation, diarrhea, rectal bleeding, heartburn or change in bowel habits (+) chronic intermittent RUQ pain GYN: Denies bleeding  Skin: Denies abnormal skin rashes Lymphatics: Denies new lymphadenopathy or easy bruising Neurological:Denies numbness, tingling or new weaknesses (+) vasomotor symptoms of menopause, hot flashes  Behavioral/Psych: Mood is stable, no new changes  All other systems were reviewed with the patient and are negative.  PHYSICAL EXAMINATION:  Vitals:   12/06/19 1004  BP: 128/90  Pulse: 74  Resp: 17  Temp: (!) 97.3 F (  36.3 C)  SpO2: 100%   Filed Weights   12/06/19 1004  Weight: 174 lb 3.2 oz (79 kg)    GENERAL:alert, no distress and comfortable SKIN: no rash on exposed skin  EYES: sclera clear LUNGS:  normal breathing effort HEART:  no lower extremity edema ABDOMEN:abdomen soft with normal bowel sounds. Mild RUQ tenderness on deep palpation. No hepatosplenomegaly or masses Musculoskeletal:no cyanosis of digits and no clubbing  PSYCH: alert & oriented x 3 with fluent speech NEURO: no focal motor/sensory deficits  LABORATORY DATA:  I have reviewed the data as listed Hepatic Function Latest Ref Rng & Units 12/06/2019 02/02/2019 05/24/2014  Total Protein 6.5 - 8.1 g/dL 7.7 7.4 7.7  Albumin 3.5 - 5.0 g/dL 4.8 4.6 3.8  AST 15 - 41 U/L _0 ALT 0 - 44 U/L _1 Alk Phosphatase 38 - 126 U/L 55 46 39  Total Bilirubin 0.3 - 1.2 mg/dL 0.4 0.5 0.3  Bilirubin, Direct 0.0 - 0.2 mg/dL 0.1 0.1 -    RADIOGRAPHIC STUDIES: I have personally reviewed the radiological images as listed and  agreed with the findings in the report. No results found.  ASSESSMENT & PLAN: 53 yo female   1. 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 We discussed this genetic mutation causes Lynch syndrome and is associated with a higher risk for certain cancers than other lynch syndrome mutations (such as MSH2, MSH6 or PMS2) in females. -patients with MLH1 mutations carriers 35-60% but up to 80% lifetime risk of colon cancer by age of 57,  With the mean age of onset 74-61 years, and  25-60% lifetime risk of endometrial cancer by age of 63, with mean age of onset 26-26 years old. Her mother died of ovarian cancer. The patient is s/p hysterectomy in 2014 and is scheduled for risk reducing BSO in 12/2019 which we support her doing that.  -We reviewed her risks for ovarian cancer (4-22%), and other extra chronic malignancy, such as stomach, hepatobiliary tract, urinary tract, small bowel, brain, skin, and pancreas cancer, in the range of 1-9%. Routine screening for these malignancy are not established and not recommended. We also discussed the use of tumor marker screening which is not standard or recommended at this time.  -We strongly recommend her to do screening colonoscopy, every one to two years, to detected earlier stage colon cancer or precancerous lesions. She agrees to have screening colonoscopy, last done in 2016 by Dr. Hilarie Fredrickson.  -We discussed EGD with colonoscopy every 2-4 years, or more frequent depending on path/findings, to screen for upper GI cancers. She is agreeable. I will cc my note to Dr. Hilarie Fredrickson with our recommendations.  -I discussed the benefit of aspirin to reduce risk of colon cancer. She has no h/o GI bleeding or blood disorder. She agrees to start after BSO. If she has intolerance to 325 mg she can take PPI or reduce to baby aspirin.  -We also discussed annual UA for GU tract  malignancies which can be done by her PCP as well as annual skin exam for skin cancer screening. She is agreeable. Will cc my note to PCP.  -We reviewed her 8.1% breast cancer risk which is that of the general population. She does not have first degree relative with breast cancer or h/o DCIS or abnormal breast biopsy. I strongly encourage her to continue annual 3D mammogram to which she agrees, managed  by Dr. Matthew Saras -We discussed indications for family testing. She has 3 children ages 19, 34, and 44. She and her husband are agreeable to consider having them tested. She understands they will need early and frequent cancer screenings if they test positive for Lynch Syndrome.  -We discussed healthy lifestyle choices that will reduce cancer risk such as limiting alcohol, abstaining from cigarettes, healthy diet and exercise. She agrees.  -Given that she is agreeable with the above recommendations and is reliable, we do not need to see her routinely unless she has new concerns. She will f/u with PCP for annual wellness exam, routine labs, UA and skin check. She will f/u with Dr. Hilarie Fredrickson for colonoscopy and EGD screenings.  -F/u open    2. RUQ pain -Ongoing for few years, pain is chronic and intermittent but stable -this prompted her to have colonoscopy in 2016 that was unremarkable. A CTA CAP in 10/2017 showed unremarkable abdomen/pelvis. Hepatic function panel was normal in 01/2019.  -She is slightly tender on exam. No signs of juandice. Hepatic function panel from today is normal -will defer additional work up to GI/PCP   PLAN: -Genetic results reviewed and discussed cancer screening recommendations  -Lab today for RUQ pain -Colonoscopy q1-2 years -EGD q2-4 years or more frequent if needed  -Annual mammogram -Annual UA and skin exam by PCP with wellness visit  -Consider taking 325 mg aspirin daily, can start after BSO recovery  -Cc note to Drs. Pyrtle GI, John PCP, and [place] [facility]  (referring) -Pending BSO by Dr. Matthew Saras 12/2019  -Recommend testing patient's children and siblings   -F/u open   Orders Placed This Encounter  Procedures  . Hepatic function panel    Standing Status:   Future    Number of Occurrences:   1    Standing Expiration Date:   12/05/2020    All questions were answered. The patient knows to call the clinic with any problems, questions or concerns.     [name], NP 12/06/2019   Addendum   I have seen the patient, examined her. I agree with the assessment and and plan and have edited the notes.   [name] was referred for MLH1 mutation related to Lynch syndrome.  Her mother had ovarian cancer, no family history of colon or endometrial cancer.  She has had a hysterectomy.  We discussed the high risk of colon cancer due to Lynch syndrome, and I strongly recommend her to have annual colonoscopy, also consider EGD every 2 to 3 years due to some risk of gastric cancer from [facility] mutation, she is agreeable.  We also reviewed other Lynch related cancers, and I encouraged her to consider BSO due to her family history of colon cancer, and moderate risk of ovarian cancer from MLH1 mutation. She will follow up with her PCP, GYN, and Dr. Hilarie Fredrickson for her routine care and cancer screening, and I will see her as needed in future. All questions were answered.   Truitt Merle  12/06/2019

## 2019-12-06 NOTE — Telephone Encounter (Signed)
PROGRESS NOTE: Fax came back complete. Placed in bin beside nurse desk.

## 2019-12-06 NOTE — Telephone Encounter (Signed)
Per Cira Rue NP faxed over today's note to Dr Molli Posey  Phone# [phone] Fax# [phone]

## 2019-12-07 ENCOUNTER — Telehealth: Payer: Self-pay | Admitting: *Deleted

## 2019-12-07 NOTE — Telephone Encounter (Signed)
-----  Message from Jerene Bears, MD sent at 12/07/2019 11:35 AM EDT ----- [name] Thanks, with Donnal Debar she needs egd and colonoscopy We will arrange Thanks JMP  [name], If patient agreeable she can be scheduled for direct EGD and colonoscopy for Lynch syndrome.  If she wishes to discuss 1st then please get her an office appt. Thanks JMP ----- Message ----- From: [name], NP Sent: 12/06/2019  12:39 PM EDT To: Molli Posey, MD, Biagio Borg, MD, #  We met this lady today in high risk clinic. Due to MLH1-related lynch syndrome, she agrees to the recommendations described in my note.   Dr. Hilarie Fredrickson,  Her last colonoscopy was in 2016. Please see her back for repeat screening. She describes intermittent RUQ pain going on for years. Mildly tender on exam. LFTs normal today. May consider baseline EGD. I will defer additional work up to you.    Dr. Jenny Reichmann,  Please include UA and skin exam with her annual wellness visits.   Dr. Matthew Saras, thank you for the referral. I will ask my nurse fax my note to you. I recommended daily aspirin for colon cancer risk reduction, if she agrees she can start after she recovers from BSO as she and I discussed.   Thanks everyone,  [name] Rakers NP

## 2019-12-07 NOTE — Telephone Encounter (Signed)
I have spoken to patient who is agreeable to direct endoscopy/colonoscopy in [place]. She has been scheduled on 01/11/20 for procedures and is scheduled for previsit on 12/21/19. Patient verbalizes understanding of these times/dates/locations for appointments.

## 2019-12-07 NOTE — Telephone Encounter (Signed)
Attempted to reach patient. No answer and unfortunately no voicemail available to leave a message. I will attempt to call back at a later time.

## 2019-12-09 ENCOUNTER — Encounter: Payer: Self-pay | Admitting: Nurse Practitioner

## 2019-12-21 ENCOUNTER — Ambulatory Visit (AMBULATORY_SURGERY_CENTER): Payer: Self-pay

## 2019-12-21 ENCOUNTER — Other Ambulatory Visit: Payer: Self-pay

## 2019-12-21 VITALS — Ht 65.0 in | Wt 178.0 lb

## 2019-12-21 DIAGNOSIS — Z1509 Genetic susceptibility to other malignant neoplasm: Secondary | ICD-10-CM

## 2019-12-21 NOTE — Progress Notes (Signed)
No egg or soy allergy known to patient  No issues with past sedation with any surgeries  or procedures, no intubation problems  No diet pills per patient No home 02 use per patient  No blood thinners per patient  Pt has issues with constipation, states bm every 3-4 days, instruct pt to take miralax 1-2 capfuls daily for 7 days prior to procedure. No A fib or A flutter  EMMI video sent to pt's e mail   covid vaccine series completed.  Due to the COVID-19 pandemic we are asking patients to follow these guidelines. Please only bring one care partner. Please be aware that your care partner may wait in the car in the parking lot or if they feel like they will be too hot to wait in the car, they may wait in the lobby on the 4th floor. All care partners are required to wear a mask the entire time (we do not have any that we can provide them), they need to practice social distancing, and we will do a Covid check for all patient's and care partners when you arrive. Also we will check their temperature and your temperature. If the care partner waits in their car they need to stay in the parking lot the entire time and we will call them on their cell phone when the patient is ready for discharge so they can bring the car to the front of the building. Also all patient's will need to wear a mask into building.

## 2019-12-24 ENCOUNTER — Encounter: Payer: Self-pay | Admitting: Internal Medicine

## 2019-12-26 ENCOUNTER — Ambulatory Visit: Payer: Managed Care, Other (non HMO) | Attending: Internal Medicine

## 2019-12-26 DIAGNOSIS — Z20822 Contact with and (suspected) exposure to covid-19: Secondary | ICD-10-CM

## 2019-12-27 LAB — NOVEL CORONAVIRUS, NAA: SARS-CoV-2, NAA: NOT DETECTED

## 2019-12-27 LAB — SARS-COV-2, NAA 2 DAY TAT

## 2020-01-11 ENCOUNTER — Other Ambulatory Visit: Payer: Self-pay

## 2020-01-11 ENCOUNTER — Ambulatory Visit (AMBULATORY_SURGERY_CENTER): Payer: Managed Care, Other (non HMO) | Admitting: Internal Medicine

## 2020-01-11 ENCOUNTER — Encounter: Payer: Self-pay | Admitting: Internal Medicine

## 2020-01-11 VITALS — BP 105/62 | HR 75 | Temp 97.5°F | Resp 13 | Ht 65.0 in | Wt 178.0 lb

## 2020-01-11 DIAGNOSIS — Z1509 Genetic susceptibility to other malignant neoplasm: Secondary | ICD-10-CM

## 2020-01-11 MED ORDER — SODIUM CHLORIDE 0.9 % IV SOLN: 500.0000 mL | INTRAVENOUS | Status: DC

## 2020-01-11 NOTE — Op Note (Signed)
[facility] Patient Name: [name] Procedure Date: 01/11/2020 2:29 PM MRN: [id number] Endoscopist: Jerene Bears , MD Age: 54 Referring MD:  Date of Birth: 30-May-1966 Gender: Female Account #: [id number] Procedure:                Colonoscopy Indications:              Lynch Syndrome (MLH1), last colonoscopy May 2016 Medicines:                Monitored Anesthesia Care Procedure:                Pre-Anesthesia Assessment:                           - Prior to the procedure, a History and Physical                            was performed, and patient medications and                            allergies were reviewed. The patient's tolerance of                            previous anesthesia was also reviewed. The risks                            and benefits of the procedure and the sedation                            options and risks were discussed with the patient.                            All questions were answered, and informed consent                            was obtained. Prior Anticoagulants: The patient has                            taken no previous anticoagulant or antiplatelet                            agents. ASA Grade Assessment: II - A patient with                            mild systemic disease. After reviewing the risks                            and benefits, the patient was deemed in                            satisfactory condition to undergo the procedure.                           After obtaining informed consent, the colonoscope  was passed under direct vision. Throughout the                            procedure, the patient's blood pressure, pulse, and                            oxygen saturations were monitored continuously. The                            Colonoscope was introduced through the anus and                            advanced to the terminal ileum. The colonoscopy was                            performed  without difficulty. The patient tolerated                            the procedure well. The quality of the bowel                            preparation was excellent. Scope In: 2:47:26 PM Scope Out: 2:59:19 PM Scope Withdrawal Time: 0 hours 8 minutes 45 seconds  Total Procedure Duration: 0 hours 11 minutes 53 seconds  Findings:                 The digital rectal exam was normal.                           The terminal ileum appeared normal.                           The entire examined colon appeared normal on direct                            and retroflexion views. Complications:            No immediate complications. Estimated Blood Loss:     Estimated blood loss: none. Impression:               - The examined portion of the ileum was normal.                           - The entire examined colon is normal on direct and                            retroflexion views.                           - No specimens collected. Recommendation:           - Patient has a contact number available for                            emergencies. The signs and symptoms of potential  delayed complications were discussed with the                            patient. Return to normal activities tomorrow.                            Written discharge instructions were provided to the                            patient.                           - Resume previous diet.                           - Continue present medications.                           - Repeat colonoscopy in 1 year for screening                            purposes given Lynch syndrome. Jerene Bears, MD 01/11/2020 3:07:16 PM This report has been signed electronically.

## 2020-01-11 NOTE — Progress Notes (Signed)
A/ox3, pleased with MAC, report to RN 

## 2020-01-11 NOTE — Progress Notes (Signed)
Called to room to assist during endoscopic procedure.  Patient ID and intended procedure confirmed with present staff. Received instructions for my participation in the procedure from the performing physician.  

## 2020-01-11 NOTE — Op Note (Signed)
[facility] Patient Name: [name] Procedure Date: 01/11/2020 2:30 PM MRN: [id number] Endoscopist: Jerene Bears , MD Age: 54 Referring MD:  Date of Birth: [birth year] Gender: Female Account #: [id number] Procedure:                Upper GI endoscopy Indications:              Lynch Syndrome, MLH1 gene abnormality Medicines:                Monitored Anesthesia Care Procedure:                Pre-Anesthesia Assessment:                           - Prior to the procedure, a History and Physical                            was performed, and patient medications and                            allergies were reviewed. The patient's tolerance of                            previous anesthesia was also reviewed. The risks                            and benefits of the procedure and the sedation                            options and risks were discussed with the patient.                            All questions were answered, and informed consent                            was obtained. Prior Anticoagulants: The patient has                            taken no previous anticoagulant or antiplatelet                            agents. ASA Grade Assessment: II - A patient with                            mild systemic disease. After reviewing the risks                            and benefits, the patient was deemed in                            satisfactory condition to undergo the procedure.                           After obtaining informed consent, the endoscope was  passed under direct vision. Throughout the                            procedure, the patient's blood pressure, pulse, and                            oxygen saturations were monitored continuously. The                            Endoscope was introduced through the mouth, and                            advanced to the second part of duodenum. The upper                            GI endoscopy was  accomplished without difficulty.                            The patient tolerated the procedure well. Scope In: Scope Out: Findings:                 The examined esophagus was normal.                           A 3 cm hiatal hernia was present.                           The entire examined stomach was normal. Biopsies                            were taken with a cold forceps for histology and                            Helicobacter pylori testing.                           The examined duodenum was normal. Complications:            No immediate complications. Estimated Blood Loss:     Estimated blood loss was minimal. Impression:               - Normal esophagus.                           - 3 cm hiatal hernia.                           - Normal stomach. Biopsied.                           - Normal examined duodenum. Recommendation:           - Patient has a contact number available for                            emergencies. The signs and symptoms of potential  delayed complications were discussed with the                            patient. Return to normal activities tomorrow.                            Written discharge instructions were provided to the                            patient.                           - Resume previous diet.                           - Continue present medications.                           - Await pathology results.                           - Repeat upper endoscopy for screening based on                            pathology results. Jerene Bears, MD 01/11/2020 3:03:13 PM This report has been signed electronically.

## 2020-01-11 NOTE — Patient Instructions (Addendum)
HANDOUTS PROVIDED ON: HIATAL HERNIA  The biopsies taken today have been sent for pathology.  The results can take 1-3 weeks to receive.  A repeat colonoscopy should occur in 1 year and a repeat upper endoscopy will be based on the pathology results.  You may resume your previous diet and medication schedule.  Thank you for allowing [place] to care for you today!!!   YOU HAD AN ENDOSCOPIC PROCEDURE TODAY AT [facility]:   Refer to the procedure report that was given to you for any specific questions about what was found during the examination.  If the procedure report does not answer your questions, please call your gastroenterologist to clarify.  If you requested that your care partner not be given the details of your procedure findings, then the procedure report has been included in a sealed envelope for you to review at your convenience later.  YOU SHOULD EXPECT: Some feelings of bloating in the abdomen. Passage of more gas than usual.  Walking can help get rid of the air that was put into your GI tract during the procedure and reduce the bloating. If you had a lower endoscopy (such as a colonoscopy or flexible sigmoidoscopy) you may notice spotting of blood in your stool or on the toilet paper. If you underwent a bowel prep for your procedure, you may not have a normal bowel movement for a few days.  Please Note:  You might notice some irritation and congestion in your nose or some drainage.  This is from the oxygen used during your procedure.  There is no need for concern and it should clear up in a day or so.  SYMPTOMS TO REPORT IMMEDIATELY:   Following lower endoscopy (colonoscopy or flexible sigmoidoscopy):  Excessive amounts of blood in the stool  Significant tenderness or worsening of abdominal pains  Swelling of the abdomen that is new, acute  Fever of 100F or higher   Following upper endoscopy (EGD)  Vomiting of blood or coffee ground material  New chest pain or  pain under the shoulder blades  Painful or persistently difficult swallowing  New shortness of breath  Fever of 100F or higher  Black, tarry-looking stools  For urgent or emergent issues, a gastroenterologist can be reached at any hour by calling [phone]. Do not use MyChart messaging for urgent concerns.    DIET:  We do recommend a small meal at first, but then you may proceed to your regular diet.  Drink plenty of fluids but you should avoid alcoholic beverages for 24 hours.  ACTIVITY:  You should plan to take it easy for the rest of today and you should NOT DRIVE or use heavy machinery until tomorrow (because of the sedation medicines used during the test).    FOLLOW UP: Our staff will call the number listed on your records 48-72 hours following your procedure to check on you and address any questions or concerns that you may have regarding the information given to you following your procedure. If we do not reach you, we will leave a message.  We will attempt to reach you two times.  During this call, we will ask if you have developed any symptoms of COVID 19. If you develop any symptoms (ie: fever, flu-like symptoms, shortness of breath, cough etc.) before then, please call [phone].  If you test positive for Covid 19 in the 2 weeks post procedure, please call and report this information to [place].    If any biopsies  were taken you will be contacted by phone or by letter within the next 1-3 weeks.  Please call us at [phone] if you have not heard about the biopsies in 3 weeks.    SIGNATURES/CONFIDENTIALITY: You and/or your care partner have signed paperwork which will be entered into your electronic medical record.  These signatures attest to the fact that that the information above on your After Visit Summary has been reviewed and is understood.  Full responsibility of the confidentiality of this discharge information lies with you and/or your care-partner.

## 2020-01-14 NOTE — H&P (Signed)
NAME: [name], ZETTLER MEDICAL RECORD [id number] ACCOUNT [id number] DATE OF BIRTH:January 23, 1966 FACILITY: [facility] LOCATION:  PHYSICIAN:Denesha Brouse Garry Heater, MD  HISTORY AND PHYSICAL  DATE OF ADMISSION:  01/31/2020  She is scheduled for laparoscopic BSO at [facility]:  Increased risk of ovarian cancer.  HISTORY OF PRESENT ILLNESS:  A 54 year old G5 P3 who has had a prior LAVH, presents at this time for laparoscopic BSO due to increased risk of ovarian cancer.  She had [facility] analysis 02/21 that showed she carries an MLH1 gene making her at  increased risk of ovarian cancer.  This procedure including risks regarding bleeding, infection, the possible need to complete the surgery via open technique, transfusion, adjacent organ injury, wound infection, phlebitis, along with her expected  recovery time or the need to consider HRT all discussed.  PAST MEDICAL HISTORY:     ALLERGIES:  None.  MEDICATIONS:  Ketaconazole topical, valacyclovir daily for HSV prevention.  PAST SURGICAL HISTORY:  She has had a prior tubal, LAVH, tonsillectomy, cesarean section.  SOCIAL HISTORY:  She is married.  Occasional alcohol use, nonsmoker.  FAMILY HISTORY:  Significant for father and mother both with heart disease.  Her mother had stage IIIA endometrial cancer.  Mother also has diabetes.  Asthma history for her sister, daughter and son, and her mother also had rheumatoid arthritis.  PHYSICAL EXAMINATION: VITAL SIGNS:  Temperature 98.2, blood pressure 118/76. HEENT:  Unremarkable. NECK:  Supple, without masses. LUNGS:  Clear. CARDIOVASCULAR:  Regular rate and rhythm without murmurs, rubs or gallops. BREASTS:  Without masses. ABDOMEN:  Soft, flat, nontender. PELVIC:  Vulva, vagina vaginal cuff normal.  Bimanual negative. NEUROLOGIC:  Unremarkable.  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  PLAN:  Laparoscopic BSO.  Procedure and risks discussed as  above.  CN/NUANCE  D:01/14/2020 T:01/14/2020 JOB:[id number]

## 2020-01-15 ENCOUNTER — Telehealth: Payer: Self-pay

## 2020-01-15 ENCOUNTER — Telehealth: Payer: Self-pay | Admitting: *Deleted

## 2020-01-15 NOTE — Telephone Encounter (Signed)
No answer/busy on follow up call. 

## 2020-01-15 NOTE — Telephone Encounter (Signed)
°  Follow up Call-  Call back number 01/11/2020  Post procedure Call Back phone  # [phone]  Permission to leave phone message No  Some recent data might be hidden     Patient questions:  Do you have a fever, pain , or abdominal swelling? No. Pain Score  0 *  Have you tolerated food without any problems? Yes.    Have you been able to return to your normal activities? Yes.    Do you have any questions about your discharge instructions: Diet   No. Medications  No. Follow up visit  No.  Do you have questions or concerns about your Care? No.  Actions: * If pain score is 4 or above: No action needed, pain <4.  1. Have you developed a fever since your procedure? no  2.   Have you had an respiratory symptoms (SOB or cough) since your procedure? no  3.   Have you tested positive for COVID 19 since your procedure no  4.   Have you had any family members/close contacts diagnosed with the COVID 19 since your procedure?  no   If yes to any of these questions please route to Joylene John, RN and Erenest Rasher, RN

## 2020-01-16 ENCOUNTER — Encounter: Payer: Self-pay | Admitting: Internal Medicine

## 2020-01-23 ENCOUNTER — Other Ambulatory Visit: Payer: Self-pay

## 2020-01-23 ENCOUNTER — Encounter (HOSPITAL_BASED_OUTPATIENT_CLINIC_OR_DEPARTMENT_OTHER): Payer: Self-pay | Admitting: Obstetrics and Gynecology

## 2020-01-23 NOTE — Progress Notes (Signed)
Spoke with: Lattie Haw NPO:  No food after midnight/Clear liquids until 4:30 AM DOS Arrival time: 530 AM Lab needs dos---- N./A             Lab results------T&S COVID test ------ No fully vaccinated on 11/26/2019, patient instructed to bring card Medications to take morning of surgery: None Pre op orders in epic Yes Diabetic medication -----N/A Patient Special Instructions -----N/A Pre-Op special Istructions -----N/A Ride home: Karma Greaser (husband) [phone]  Patient verbalized understanding of instructions that were given at this phone interview. Patient denies shortness of breath, chest pain, fever, cough a this phone interview.

## 2020-01-23 NOTE — Progress Notes (Signed)
NEW Covid Policy July 8325  Surgery Day:    Facility:  [facility]  Type of Surgery: 01/31/2020  Fully Covid Vaccinated:   1)10/25/2019                                          2)11/26/2019                                          Where?                                           Type? [place]  Do you have symptoms? No  In the past 14 days:        Have you had any symptoms? No       Have you been tested covid positive? Yes 05/23/2019       Have you been in contact with someone covid positive? No        Is pt Immuno-compromised? No

## 2020-01-29 ENCOUNTER — Inpatient Hospital Stay (HOSPITAL_COMMUNITY): Admission: RE | Admit: 2020-01-29 | Payer: Managed Care, Other (non HMO) | Source: Ambulatory Visit

## 2020-01-31 ENCOUNTER — Ambulatory Visit (HOSPITAL_BASED_OUTPATIENT_CLINIC_OR_DEPARTMENT_OTHER)
Admission: RE | Admit: 2020-01-31 | Payer: Managed Care, Other (non HMO) | Source: Home / Self Care | Admitting: Obstetrics and Gynecology

## 2020-01-31 HISTORY — DX: Presence of spectacles and contact lenses: Z97.3

## 2020-01-31 HISTORY — DX: Other intervertebral disc displacement, lumbar region: M51.26

## 2020-01-31 HISTORY — DX: Herpesviral infection, unspecified: B00.9

## 2020-01-31 HISTORY — DX: Personal history of other diseases of the digestive system: Z87.19

## 2020-01-31 HISTORY — DX: Genetic susceptibility to other malignant neoplasm: Z15.09

## 2020-01-31 HISTORY — DX: Genetic susceptibility to other malignant neoplasm of digestive system: Z15.068

## 2020-01-31 SURGERY — SALPINGO-OOPHORECTOMY, LAPAROSCOPIC
Anesthesia: General | Laterality: Bilateral

## 2020-06-04 ENCOUNTER — Other Ambulatory Visit: Payer: Self-pay

## 2020-06-04 ENCOUNTER — Encounter: Payer: Self-pay | Admitting: Internal Medicine

## 2020-06-04 ENCOUNTER — Ambulatory Visit (INDEPENDENT_AMBULATORY_CARE_PROVIDER_SITE_OTHER): Payer: Managed Care, Other (non HMO) | Admitting: Internal Medicine

## 2020-06-04 ENCOUNTER — Other Ambulatory Visit: Payer: Managed Care, Other (non HMO)

## 2020-06-04 VITALS — BP 120/80 | HR 77 | Temp 97.8°F | Ht 65.0 in | Wt 177.0 lb

## 2020-06-04 DIAGNOSIS — E559 Vitamin D deficiency, unspecified: Secondary | ICD-10-CM

## 2020-06-04 DIAGNOSIS — Z0000 Encounter for general adult medical examination without abnormal findings: Secondary | ICD-10-CM | POA: Diagnosis not present

## 2020-06-04 DIAGNOSIS — Z1159 Encounter for screening for other viral diseases: Secondary | ICD-10-CM | POA: Diagnosis not present

## 2020-06-04 DIAGNOSIS — E538 Deficiency of other specified B group vitamins: Secondary | ICD-10-CM | POA: Diagnosis not present

## 2020-06-04 LAB — LIPID PANEL
Cholesterol: 152 mg/dL (ref 0–200)
HDL: 58.3 mg/dL (ref >39.00)
LDL Cholesterol: 82 mg/dL (ref 0–99)
NonHDL: 93.47
Total CHOL/HDL Ratio: 3
Triglycerides: 56 mg/dL (ref 0.0–149.0)
VLDL: 11.2 mg/dL (ref 0.0–40.0)

## 2020-06-04 LAB — TSH: TSH: 0.69 u[IU]/mL (ref 0.35–4.50)

## 2020-06-04 LAB — CBC WITH DIFFERENTIAL/PLATELET
Basophils Absolute: 0.1 10*3/uL (ref 0.0–0.1)
Basophils Relative: 1.3 % (ref 0.0–3.0)
Eosinophils Absolute: 0.1 10*3/uL (ref 0.0–0.7)
Eosinophils Relative: 2.6 % (ref 0.0–5.0)
HCT: 44.8 % (ref 36.0–46.0)
Hemoglobin: 15 g/dL (ref 12.0–15.0)
Lymphocytes Relative: 38.2 % (ref 12.0–46.0)
Lymphs Abs: 1.7 10*3/uL (ref 0.7–4.0)
MCHC: 33.5 g/dL (ref 30.0–36.0)
MCV: 84.3 fl (ref 78.0–100.0)
Monocytes Absolute: 0.3 10*3/uL (ref 0.1–1.0)
Monocytes Relative: 6.9 % (ref 3.0–12.0)
Neutro Abs: 2.3 10*3/uL (ref 1.4–7.7)
Neutrophils Relative %: 51 % (ref 43.0–77.0)
Platelets: 218 10*3/uL (ref 150.0–400.0)
RBC: 5.32 Mil/uL — ABNORMAL HIGH (ref 3.87–5.11)
RDW: 13.3 % (ref 11.5–15.5)
WBC: 4.5 10*3/uL (ref 4.0–10.5)

## 2020-06-04 LAB — BASIC METABOLIC PANEL
BUN: 10 mg/dL (ref 6–23)
CO2: 24 mEq/L (ref 19–32)
Calcium: 9.6 mg/dL (ref 8.4–10.5)
Chloride: 102 mEq/L (ref 96–112)
Creatinine, Ser: 0.83 mg/dL (ref 0.40–1.20)
GFR: 80.94 mL/min (ref >60.00)
Glucose, Bld: 80 mg/dL (ref 70–99)
Potassium: 3.4 mEq/L — ABNORMAL LOW (ref 3.5–5.1)
Sodium: 138 mEq/L (ref 135–145)

## 2020-06-04 LAB — HEPATIC FUNCTION PANEL
ALT: 17 U/L (ref 0–35)
AST: 20 U/L (ref 0–37)
Albumin: 4.2 g/dL (ref 3.5–5.2)
Alkaline Phosphatase: 48 U/L (ref 39–117)
Bilirubin, Direct: 0.1 mg/dL (ref 0.0–0.3)
Total Bilirubin: 0.5 mg/dL (ref 0.2–1.2)
Total Protein: 7.8 g/dL (ref 6.0–8.3)

## 2020-06-04 LAB — VITAMIN B12: Vitamin B-12: 702 pg/mL (ref 211–911)

## 2020-06-04 LAB — VITAMIN D 25 HYDROXY (VIT D DEFICIENCY, FRACTURES): VITD: 27.26 ng/mL — ABNORMAL LOW (ref 30.00–100.00)

## 2020-06-04 NOTE — Patient Instructions (Signed)

## 2020-06-04 NOTE — Progress Notes (Signed)
Subjective:    Patient ID: [name], female    DOB: May 18, 1966, 54 y.o.   MRN: [id number]  HPI  Here for wellness and f/u;  Overall doing ok;  Pt denies Chest pain, worsening SOB, DOE, wheezing, orthopnea, PND, worsening LE edema, palpitations, dizziness or syncope.  Pt denies neurological change such as new headache, facial or extremity weakness.  Pt denies polydipsia, polyuria, or low sugar symptoms. Pt states overall good compliance with treatment and medications, good tolerability, and has been trying to follow appropriate diet.  Pt denies worsening depressive symptoms, suicidal ideation or panic. No fever, night sweats, wt loss, loss of appetite, or other constitutional symptoms.  Pt states good ability with ADL's, has low fall risk, home safety reviewed and adequate, no other significant changes in hearing or vision, and only occasionally active with exercise. Wt Readings from Last 3 Encounters:  06/04/20 177 lb (80.3 kg)  01/11/20 178 lb (80.7 kg)  12/21/19 178 lb (80.7 kg)  s/p covid infxn nov 2020, no hospn, then vaccinated, no booster yet.  Has pap and mammogram sched for next wk.   Past Medical History:  Diagnosis Date  . ACUTE NASOPHARYNGITIS 05/07/2010  . Allergic rhinitis, cause unspecified 04/19/2011  . Allergy    seasonal  . Anemia    History of anemia  . Anxiety   . BACK PAIN 02/05/2010  . CELLULITIS 01/13/2010  . CHEST PAIN UNSPECIFIED 08/23/2008  . CONSTIPATION, CHRONIC 04/09/2007  . DEEP VENOUS THROMBOPHLEBITIS, LEFT, LEG, HX OF 04/09/2007  . [place] DISEASE, 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  . DIZZINESS 08/15/2008  . DYSPNEA 05/20/2008  . FATIGUE 05/20/2008  . History of 2019 novel coronavirus disease (COVID-19) 05/23/2019  . History of hiatal hernia   . HSV infection   . KNEE PAIN, LEFT 09/11/2007  . Left sided sciatica 04/19/2011  . Lumbar herniated disc   . Lynch syndrome   . MIGRAINE HEADACHE 04/09/2007  . Ovarian cyst   . Palpitations 05/20/2008  . Pancreatic  lesion   . PONV (postoperative nausea and vomiting)   . PREMATURE VENTRICULAR CONTRACTIONS 08/23/2008  . RASH-NONVESICULAR 01/17/2008  . RUQ PAIN 02/23/2010  . SICKLE CELL TRAIT 04/09/2007  . Tension headache 04/09/2007  . VARICOSE VEINS LOWER EXTREMITIES W/OTH COMPS 09/11/2007  . Wears glasses    Past Surgical History:  Procedure Laterality Date  . CESAREAN SECTION  2004  . COLONOSCOPY  2016  . FINGER FRACTURE SURGERY Left 03/2019  . LAPAROSCOPIC ASSISTED VAGINAL HYSTERECTOMY N/A 10/23/2012   Procedure: LAPAROSCOPIC ASSISTED VAGINAL HYSTERECTOMY;  Surgeon: Margarette Asal, MD;  Location: [facility];  Service: Gynecology;  Laterality: N/A;  . TONSILLECTOMY  07/18/2009  . TUBAL LIGATION  01/06/2012   with cyst removal  . UPPER GASTROINTESTINAL ENDOSCOPY      reports that she has never smoked. She has never used smokeless tobacco. She reports current alcohol use. She reports that she does not use drugs. family history includes Brain cancer in her maternal aunt; Cancer in her cousin and mother; Heart attack in her father; Heart disease in her mother; Hypertension in her mother; Liver cancer in her maternal aunt; Lung cancer in her maternal aunt; Pancreatitis in her father. Allergies  Allergen Reactions  . Morphine And Related Rash   No current outpatient medications on file prior to visit.   No current facility-administered medications on file prior to visit.    Review of Systems All otherwise neg per pt    Objective:   Physical Exam  BP 120/80 (BP Location: Left Arm, Patient Position: Sitting, Cuff Size: Large)   Pulse 77   Temp 97.8 F (36.6 C) (Oral)   Ht 5\' 5"  (1.651 m)   Wt 177 lb (80.3 kg)   LMP 10/06/2012   SpO2 98%   BMI 29.45 kg/m  VS noted,  Constitutional: Pt appears in NAD HENT: Head: NCAT.  Right Ear: External ear normal.  Left Ear: External ear normal.  Eyes: . Pupils are equal, round, and reactive to light. Conjunctivae and EOM are normal Nose: without d/c or  deformity Neck: Neck supple. Gross normal ROM Cardiovascular: Normal rate and regular rhythm.   Pulmonary/Chest: Effort normal and breath sounds without rales or wheezing.  Abd:  Soft, NT, ND, + BS, no organomegaly Neurological: Pt is alert. At baseline orientation, motor grossly intact Skin: Skin is warm. No rashes, other new lesions, no LE edema Psychiatric: Pt behavior is normal without agitation  All otherwise neg per pt Lab Results  Component Value Date   WBC 4.5 06/04/2020   HGB 15.0 06/04/2020   HCT 44.8 06/04/2020   PLT 218.0 06/04/2020   GLUCOSE 80 06/04/2020   CHOL 152 06/04/2020   TRIG 56.0 06/04/2020   HDL 58.30 06/04/2020   LDLCALC 82 06/04/2020   ALT 17 06/04/2020   AST 20 06/04/2020   NA 138 06/04/2020   K 3.4 (L) 06/04/2020   CL 102 06/04/2020   CREATININE 0.83 06/04/2020   BUN 10 06/04/2020   CO2 24 06/04/2020   TSH 0.69 06/04/2020   INR 1.0 03/01/2007      Assessment & Plan:

## 2020-06-05 ENCOUNTER — Encounter: Payer: Self-pay | Admitting: Internal Medicine

## 2020-06-05 LAB — URINALYSIS, ROUTINE W REFLEX MICROSCOPIC
Bilirubin Urine: NEGATIVE
Ketones, ur: 40 — AB
Leukocytes,Ua: NEGATIVE
Nitrite: NEGATIVE
Specific Gravity, Urine: 1.025 (ref 1.000–1.030)
Total Protein, Urine: NEGATIVE
Urine Glucose: NEGATIVE
Urobilinogen, UA: 0.2 (ref 0.0–1.0)
pH: 5.5 (ref 5.0–8.0)

## 2020-06-05 LAB — HEPATITIS C ANTIBODY
Hepatitis C Ab: NONREACTIVE
SIGNAL TO CUT-OFF: 0.01 (ref <1.00)

## 2020-06-06 ENCOUNTER — Encounter: Payer: Self-pay | Admitting: Internal Medicine

## 2020-06-07 ENCOUNTER — Encounter: Payer: Self-pay | Admitting: Internal Medicine

## 2020-06-07 NOTE — Assessment & Plan Note (Signed)

## 2020-09-10 LAB — HM PAP SMEAR: HM Pap smear: NORMAL

## 2020-09-22 ENCOUNTER — Encounter: Payer: Self-pay | Admitting: Internal Medicine

## 2020-09-29 ENCOUNTER — Encounter: Payer: Self-pay | Admitting: Internal Medicine

## 2020-09-29 MED ORDER — TRAZODONE HCL 50 MG PO TABS: 25.0000 mg | ORAL_TABLET | Freq: Every evening | ORAL | 3 refills | Status: DC | PRN

## 2020-10-09 MED ORDER — ZOLPIDEM TARTRATE 10 MG PO TABS
10.0000 mg | ORAL_TABLET | Freq: Every evening | ORAL | 1 refills | Status: DC | PRN
Start: 2020-10-09 — End: 2022-06-16

## 2020-10-09 NOTE — Addendum Note (Signed)
Addended by: Biagio Borg on: 10/09/2020 05:08 PM   Modules accepted: Orders

## 2020-11-17 ENCOUNTER — Ambulatory Visit: Payer: Self-pay

## 2020-11-25 ENCOUNTER — Encounter: Payer: Self-pay | Admitting: Internal Medicine

## 2020-11-25 ENCOUNTER — Ambulatory Visit: Payer: Managed Care, Other (non HMO) | Admitting: Internal Medicine

## 2020-11-25 ENCOUNTER — Other Ambulatory Visit: Payer: Self-pay

## 2020-11-25 VITALS — BP 120/70 | HR 73 | Temp 98.3°F | Ht 65.0 in | Wt 169.0 lb

## 2020-11-25 DIAGNOSIS — F411 Generalized anxiety disorder: Secondary | ICD-10-CM

## 2020-11-25 DIAGNOSIS — R059 Cough, unspecified: Secondary | ICD-10-CM | POA: Diagnosis not present

## 2020-11-25 DIAGNOSIS — F5101 Primary insomnia: Secondary | ICD-10-CM | POA: Diagnosis not present

## 2020-11-25 MED ORDER — LEVOFLOXACIN 500 MG PO TABS
500.0000 mg | ORAL_TABLET | Freq: Every day | ORAL | 0 refills | Status: AC
Start: 2020-11-25 — End: 2020-12-05

## 2020-11-25 MED ORDER — HYDROCODONE BIT-HOMATROP MBR 5-1.5 MG/5ML PO SOLN
5.0000 mL | Freq: Four times a day (QID) | ORAL | 0 refills | Status: AC | PRN
Start: 2020-11-25 — End: 2020-12-05

## 2020-11-25 NOTE — Patient Instructions (Signed)
Please take all new medication as prescribed - the antibiotic, and cough medicine if needed  Please continue all other medications as before, and refills have been done if requested.  Please have the pharmacy call with any other refills you may need.  Please keep your appointments with your specialists as you may have planned     

## 2020-12-01 ENCOUNTER — Ambulatory Visit: Payer: Managed Care, Other (non HMO) | Admitting: Internal Medicine

## 2020-12-02 ENCOUNTER — Encounter: Payer: Self-pay | Admitting: Internal Medicine

## 2020-12-02 DIAGNOSIS — R059 Cough, unspecified: Secondary | ICD-10-CM | POA: Insufficient documentation

## 2020-12-02 DIAGNOSIS — G4700 Insomnia, unspecified: Secondary | ICD-10-CM | POA: Insufficient documentation

## 2020-12-02 NOTE — Progress Notes (Signed)
Patient ID: [name], female   DOB: 1965/07/26, 55 y.o.   MRN: [id number]        Chief Complaint: cough       HPI:  [name] is a 55 y.o. female Here with acute onset mild to mod 10 days ST, HA, general weakness and malaise, with prod cough greenish yellow sputum, but Pt denies chest pain, increased sob or doe, wheezing, orthopnea, PND, increased LE swelling, palpitations, dizziness or syncope.  Pt denies polydipsia, polyuria, or new focal neuro s/s.   5/6 covid testing neg.  Is s/p tonsillectomy.  Teeth ok recently per dental.  Denies worsening depressive symptoms, suicidal ideation, or pani.  [name] working ok for sleep, was just refilled mar 2022.         Wt Readings from Last 3 Encounters:  11/25/20 169 lb (76.7 kg)  06/04/20 177 lb (80.3 kg)  01/11/20 178 lb (80.7 kg)   BP Readings from Last 3 Encounters:  11/25/20 120/70  06/04/20 120/80  01/11/20 105/62         Past Medical History:  Diagnosis Date  . ACUTE NASOPHARYNGITIS 05/07/2010  . Allergic rhinitis, cause unspecified 04/19/2011  . Allergy    seasonal  . Anemia    History of anemia  . Anxiety   . BACK PAIN 02/05/2010  . CELLULITIS 01/13/2010  . CHEST PAIN UNSPECIFIED 08/23/2008  . CONSTIPATION, CHRONIC 04/09/2007  . DEEP VENOUS THROMBOPHLEBITIS, LEFT, LEG, HX OF 04/09/2007  . [place] DISEASE, 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  . DIZZINESS 08/15/2008  . DYSPNEA 05/20/2008  . FATIGUE 05/20/2008  . History of 2019 novel coronavirus disease (COVID-19) 05/23/2019  . History of hiatal hernia   . HSV infection   . KNEE PAIN, LEFT 09/11/2007  . Left sided sciatica 04/19/2011  . Lumbar herniated disc   . Lynch syndrome   . MIGRAINE HEADACHE 04/09/2007  . Ovarian cyst   . Palpitations 05/20/2008  . Pancreatic lesion   . PONV (postoperative nausea and vomiting)   . PREMATURE VENTRICULAR CONTRACTIONS 08/23/2008  . RASH-NONVESICULAR 01/17/2008  . RUQ PAIN 02/23/2010  . SICKLE CELL TRAIT 04/09/2007  . Tension headache 04/09/2007   . VARICOSE VEINS LOWER EXTREMITIES W/OTH COMPS 09/11/2007  . Wears glasses    Past Surgical History:  Procedure Laterality Date  . CESAREAN SECTION  2004  . COLONOSCOPY  2016  . FINGER FRACTURE SURGERY Left 03/2019  . LAPAROSCOPIC ASSISTED VAGINAL HYSTERECTOMY N/A 10/23/2012   Procedure: LAPAROSCOPIC ASSISTED VAGINAL HYSTERECTOMY;  Surgeon: Margarette Asal, MD;  Location: [facility];  Service: Gynecology;  Laterality: N/A;  . TONSILLECTOMY  07/18/2009  . TUBAL LIGATION  01/06/2012   with cyst removal  . UPPER GASTROINTESTINAL ENDOSCOPY      reports that she has never smoked. She has never used smokeless tobacco. She reports current alcohol use. She reports that she does not use drugs. family history includes Brain cancer in her maternal aunt; Cancer in her cousin and mother; Heart attack in her father; Heart disease in her mother; Hypertension in her mother; Liver cancer in her maternal aunt; Lung cancer in her maternal aunt; Pancreatitis in her father. Allergies  Allergen Reactions  . Morphine And Related Rash   Current Outpatient Medications on File Prior to Visit  Medication Sig Dispense Refill  . PARoxetine (PAXIL) 10 MG tablet Take 10 mg by mouth daily.    Marland Kitchen zolpidem (AMBIEN) 10 MG tablet Take 1 tablet (10 mg total) by mouth at bedtime as needed for  sleep. 90 tablet 1   No current facility-administered medications on file prior to visit.        ROS:  All others reviewed and negative.  Objective        PE:  BP 120/70 (BP Location: Right Arm, Patient Position: Sitting, Cuff Size: Normal)   Pulse 73   Temp 98.3 F (36.8 C) (Oral)   Ht 5\' 5"  (1.651 m)   Wt 169 lb (76.7 kg)   LMP 10/06/2012   SpO2 99%   BMI 28.12 kg/m                 Constitutional: Pt appears in NAD               HENT: Head: NCAT.                Right Ear: External ear normal.                 Left Ear: External ear normal.                Eyes: . Pupils are equal, round, and reactive to light. Conjunctivae  and EOM are normal; .Bilat tm's with mild erythema.  Max sinus areas non tender.  Pharynx with mild erythema, no exudate               Nose: without d/c or deformity               Neck: Neck supple. Gross normal ROM               Cardiovascular: Normal rate and regular rhythm.                 Pulmonary/Chest: Effort normal and breath sounds without rales or wheezing.                             Neurological: Pt is alert. At baseline orientation, motor grossly intact               Skin: Skin is warm. No rashes, no other new lesions, LE edema - none               Psychiatric: Pt behavior is normal without agitation   Micro: none  Cardiac tracings I have personally interpreted today:  none  Pertinent Radiological findings (summarize): none   Lab Results  Component Value Date   WBC 4.5 06/04/2020   HGB 15.0 06/04/2020   HCT 44.8 06/04/2020   PLT 218.0 06/04/2020   GLUCOSE 80 06/04/2020   CHOL 152 06/04/2020   TRIG 56.0 06/04/2020   HDL 58.30 06/04/2020   LDLCALC 82 06/04/2020   ALT 17 06/04/2020   AST 20 06/04/2020   NA 138 06/04/2020   K 3.4 (L) 06/04/2020   CL 102 06/04/2020   CREATININE 0.83 06/04/2020   BUN 10 06/04/2020   CO2 24 06/04/2020   TSH 0.69 06/04/2020   INR 1.0 03/01/2007   Assessment/Plan:  BROOKLYNNE [name] is a 55 y.o. Black or African American [2] female with  has a past medical history of ACUTE NASOPHARYNGITIS (05/07/2010), Allergic rhinitis, cause unspecified (04/19/2011), Allergy, Anemia, Anxiety, BACK PAIN (02/05/2010), CELLULITIS (01/13/2010), CHEST PAIN UNSPECIFIED (08/23/2008), CONSTIPATION, CHRONIC (04/09/2007), DEEP VENOUS THROMBOPHLEBITIS, LEFT, LEG, HX OF (04/09/2007), DEGENERATIVE DISC DISEASE, LUMBAR SPINE (05/12/2007), DIZZINESS (08/15/2008), DYSPNEA (05/20/2008), FATIGUE (05/20/2008), History of 2019 novel coronavirus disease (COVID-19) (05/23/2019), History of hiatal hernia, HSV infection, KNEE PAIN, LEFT (  09/11/2007), Left sided sciatica (04/19/2011), Lumbar  herniated disc, Lynch syndrome, MIGRAINE HEADACHE (04/09/2007), Ovarian cyst, Palpitations (05/20/2008), Pancreatic lesion, PONV (postoperative nausea and vomiting), PREMATURE VENTRICULAR CONTRACTIONS (08/23/2008), RASH-NONVESICULAR (01/17/2008), RUQ PAIN (02/23/2010), SICKLE CELL TRAIT (04/09/2007), Tension headache (04/09/2007), VARICOSE VEINS LOWER EXTREMITIES W/OTH COMPS (09/11/2007), and Wears glasses.  Cough Mild to mod, c/w bronchiis vs pna, declines cxr,  for antibx course, cuogh med prn,  to f/u any worsening symptoms or concerns  Anxiety state Stable, cont current med tx paxil, reassurance   Insomnia Stable, to cotinue ambien qhs prn,  to f/u any worsening symptoms or concerns  Followup: Return if symptoms worsen or fail to improve.  Cathlean Cower, MD 12/02/2020 6:40 AM [facility]

## 2020-12-02 NOTE — Assessment & Plan Note (Signed)
Stable, cont current med tx paxil, reassurance

## 2020-12-02 NOTE — Assessment & Plan Note (Signed)
Mild to mod, c/w bronchiis vs pna, declines cxr,  for antibx course, cuogh med prn,  to f/u any worsening symptoms or concerns

## 2020-12-02 NOTE — Assessment & Plan Note (Signed)
Stable, to cotinue ambien qhs prn,  to f/u any worsening symptoms or concerns

## 2020-12-12 ENCOUNTER — Telehealth: Payer: Managed Care, Other (non HMO) | Admitting: Internal Medicine

## 2020-12-12 ENCOUNTER — Telehealth: Payer: Self-pay | Admitting: Internal Medicine

## 2020-12-12 NOTE — Telephone Encounter (Signed)
Error

## 2021-03-19 ENCOUNTER — Other Ambulatory Visit: Payer: Self-pay

## 2021-03-19 ENCOUNTER — Ambulatory Visit: Payer: Managed Care, Other (non HMO) | Admitting: Internal Medicine

## 2021-03-19 ENCOUNTER — Encounter: Payer: Self-pay | Admitting: Internal Medicine

## 2021-03-19 DIAGNOSIS — J029 Acute pharyngitis, unspecified: Secondary | ICD-10-CM

## 2021-03-19 MED ORDER — DOXYCYCLINE HYCLATE 100 MG PO TABS: 100.0000 mg | ORAL_TABLET | Freq: Two times a day (BID) | ORAL | 0 refills | Status: DC

## 2021-03-19 NOTE — Patient Instructions (Addendum)
Your strep test and flu tests were negative  Please take all new medication as prescribed - the antibiotic  Please continue all other medications as before, and refills have been done if requested.  Please have the pharmacy call with any other refills you may need.  Please continue your efforts at being more active, low cholesterol diet, and weight control.  Please keep your appointments with your specialists as you may have planned

## 2021-03-19 NOTE — Progress Notes (Signed)
Patient ID: [name], female   DOB: 01-24-1966, 55 y.o.   MRN: [id number]        Chief Complaint: follow up severe ST x 2 wks       HPI:  [name] is a 55 y.o. female here with c/o acute onset 2 wks onset severe ST just not getting better, tested home covid neg x 3 with latest test yesterday, associated with bilat neck pain, feverish and fatigue, malaise, just not getting better .  Has hx of covid infection June 2022, this seems differrent.  Pt denies chest pain, increased sob or doe, wheezing, orthopnea, PND, increased LE swelling, palpitations, dizziness or syncope.   Pt denies polydipsia, polyuria, or focal neuro s/s.   Pt denies recent wt loss, night sweats, or other constitutional symptoms         Wt Readings from Last 3 Encounters:  03/19/21 169 lb (76.7 kg)  11/25/20 169 lb (76.7 kg)  06/04/20 177 lb (80.3 kg)   BP Readings from Last 3 Encounters:  03/19/21 130/82  11/25/20 120/70  06/04/20 120/80         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   DIZZINESS 08/15/2008   DYSPNEA 05/20/2008   FATIGUE 05/20/2008   History of 2019 novel coronavirus disease (COVID-19) 05/23/2019   History of hiatal hernia    HSV infection    KNEE PAIN, LEFT 09/11/2007   Left sided sciatica 04/19/2011   Lumbar herniated disc    Lynch syndrome    MIGRAINE HEADACHE 04/09/2007   Ovarian cyst    Palpitations 05/20/2008   Pancreatic lesion    PONV (postoperative nausea and vomiting)    PREMATURE VENTRICULAR CONTRACTIONS 08/23/2008   RASH-NONVESICULAR 01/17/2008   RUQ PAIN 02/23/2010   SICKLE CELL TRAIT 04/09/2007   Tension  headache 04/09/2007   VARICOSE VEINS LOWER EXTREMITIES W/OTH COMPS 09/11/2007   Wears glasses    Past Surgical History:  Procedure Laterality Date   CESAREAN SECTION  2004   COLONOSCOPY  2016   FINGER FRACTURE SURGERY Left 03/2019   LAPAROSCOPIC ASSISTED VAGINAL HYSTERECTOMY N/A 10/23/2012   Procedure: LAPAROSCOPIC ASSISTED VAGINAL HYSTERECTOMY;  Surgeon: Margarette Asal, MD;  Location: [facility];  Service: Gynecology;  Laterality: N/A;   TONSILLECTOMY  07/18/2009   TUBAL LIGATION  01/06/2012   with cyst removal   UPPER GASTROINTESTINAL ENDOSCOPY      reports that she has never smoked. She has never used smokeless tobacco. She reports current alcohol use. She reports that she does not use drugs. family history includes Brain cancer in her maternal aunt; Cancer in her cousin and mother; Heart attack in her father; Heart disease in her mother; Hypertension in her mother; Liver cancer in her maternal aunt; Lung cancer in her maternal aunt; Pancreatitis in her father. Allergies  Allergen Reactions   Morphine And Related Rash   Current Outpatient Medications on File Prior to Visit  Medication Sig Dispense Refill   PARoxetine (PAXIL) 10 MG tablet Take 10 mg by mouth daily.     zolpidem (AMBIEN) 10 MG tablet Take 1  tablet (10 mg total) by mouth at bedtime as needed for sleep. 90 tablet 1   No current facility-administered medications on file prior to visit.        ROS:  All others reviewed and negative.  Objective        PE:  BP 130/82 (BP Location: Left Arm, Patient Position: Sitting, Cuff Size: Normal)   Pulse (!) 56   Temp 98.1 F (36.7 C) (Oral)   Ht '5\' 5"'$  (1.651 m)   Wt 169 lb (76.7 kg)   LMP 10/06/2012   SpO2 98%   BMI 28.12 kg/m                 Constitutional: Pt appears in NAD               HENT: Head: NCAT.                Right Ear: External ear normal.                 Left Ear: External ear normal.                Eyes: . Pupils are equal, round, and reactive to light.  Conjunctivae and EOM are normal; Bilat tm's with mild erythema.  Max sinus areas non tender.  Pharynx with severe erythema, no exudate but uvula 2+ red, swelling               Nose: without d/c or deformity               Neck: Neck supple. Gross normal ROM but with moderate bilateral submandibular tender LA               Cardiovascular: Normal rate and regular rhythm.                 Pulmonary/Chest: Effort normal and breath sounds without rales or wheezing.                               Neurological: Pt is alert. At baseline orientation, motor grossly intact               Skin: Skin is warm. No rashes, no other new lesions, LE edema - none               Psychiatric: Pt behavior is normal without agitation   Micro: none  Cardiac tracings I have personally interpreted today:  none  Pertinent Radiological findings (summarize): none   Lab Results  Component Value Date   WBC 4.5 06/04/2020   HGB 15.0 06/04/2020   HCT 44.8 06/04/2020   PLT 218.0 06/04/2020   GLUCOSE 80 06/04/2020   CHOL 152 06/04/2020   TRIG 56.0 06/04/2020   HDL 58.30 06/04/2020   LDLCALC 82 06/04/2020   ALT 17 06/04/2020   AST 20 06/04/2020   NA 138 06/04/2020   K 3.4 (L) 06/04/2020   CL 102 06/04/2020   CREATININE 0.83 06/04/2020   BUN 10 06/04/2020   CO2 24 06/04/2020   TSH 0.69 06/04/2020   INR 1.0 03/01/2007   Assessment/Plan:  [name] is a 55 y.o. Black or African American [2] female with  has a past medical history of ACUTE NASOPHARYNGITIS (05/07/2010), Allergic rhinitis, cause unspecified (04/19/2011), Allergy, Anemia, Anxiety, BACK PAIN (02/05/2010), CELLULITIS (01/13/2010), CHEST PAIN UNSPECIFIED (08/23/2008), CONSTIPATION, CHRONIC (04/09/2007), DEEP VENOUS THROMBOPHLEBITIS, LEFT, LEG, HX OF (04/09/2007), DEGENERATIVE DISC DISEASE,  LUMBAR SPINE (05/12/2007), DIZZINESS (08/15/2008), DYSPNEA (05/20/2008), FATIGUE (05/20/2008), History of 2019 novel coronavirus disease (COVID-19) (05/23/2019), History of hiatal  hernia, HSV infection, KNEE PAIN, LEFT (09/11/2007), Left sided sciatica (04/19/2011), Lumbar herniated disc, Lynch syndrome, MIGRAINE HEADACHE (04/09/2007), Ovarian cyst, Palpitations (05/20/2008), Pancreatic lesion, PONV (postoperative nausea and vomiting), PREMATURE VENTRICULAR CONTRACTIONS (08/23/2008), RASH-NONVESICULAR (01/17/2008), RUQ PAIN (02/23/2010), SICKLE CELL TRAIT (04/09/2007), Tension headache (04/09/2007), VARICOSE VEINS LOWER EXTREMITIES W/OTH COMPS (09/11/2007), and Wears glasses.  Acute pharyngitis Mild to mod, for antibx course,  to f/u any worsening symptoms or concerns  Followup: No follow-ups on file.  Cathlean Cower, MD 03/22/2021 9:04 PM [facility]

## 2021-03-22 DIAGNOSIS — J029 Acute pharyngitis, unspecified: Secondary | ICD-10-CM | POA: Insufficient documentation

## 2021-03-22 NOTE — Assessment & Plan Note (Signed)
Mild to mod, for antibx course,  to f/u any worsening symptoms or concerns 

## 2021-04-08 ENCOUNTER — Encounter: Payer: Self-pay | Admitting: Internal Medicine

## 2021-05-01 ENCOUNTER — Other Ambulatory Visit: Payer: Self-pay

## 2021-05-01 ENCOUNTER — Ambulatory Visit (AMBULATORY_SURGERY_CENTER): Payer: Self-pay | Admitting: *Deleted

## 2021-05-01 VITALS — Ht 65.0 in | Wt 171.0 lb

## 2021-05-01 DIAGNOSIS — Z1509 Genetic susceptibility to other malignant neoplasm: Secondary | ICD-10-CM | POA: Insufficient documentation

## 2021-05-01 MED ORDER — PEG-KCL-NACL-NASULF-NA ASC-C 100 G PO SOLR: 1.0000 | Freq: Once | ORAL | 0 refills | Status: AC

## 2021-05-01 NOTE — Progress Notes (Signed)
Virtual pre visit completed over telephone. Instructions forwarded through [facility] and email [email or web address]   No egg or soy allergy known to patient  No issues known to pt with past sedation with any surgeries or procedures Patient denies ever being told they had issues or difficulty with intubation  No FH of Malignant Hyperthermia Pt is not on diet pills Pt is not on  home 02  Pt is not on blood thinners  Pt denies issues with constipation  No A fib or A flutter  Pt is fully vaccinated  for Covid   Discussed with pt there will be an out-of-pocket cost for prep and that varies from $0 to 70 +  dollars - pt verbalized understanding   Due to the COVID-19 pandemic we are asking patients to follow certain guidelines in [facility] and the [facility]   Pt aware of COVID protocols and [facility] guidelines

## 2021-05-05 ENCOUNTER — Other Ambulatory Visit (INDEPENDENT_AMBULATORY_CARE_PROVIDER_SITE_OTHER): Payer: Managed Care, Other (non HMO)

## 2021-05-05 ENCOUNTER — Other Ambulatory Visit: Payer: Self-pay

## 2021-05-05 ENCOUNTER — Ambulatory Visit: Payer: Managed Care, Other (non HMO) | Admitting: Internal Medicine

## 2021-05-05 VITALS — BP 118/70 | HR 80 | Ht 65.0 in | Wt 174.0 lb

## 2021-05-05 DIAGNOSIS — R42 Dizziness and giddiness: Secondary | ICD-10-CM

## 2021-05-05 DIAGNOSIS — E559 Vitamin D deficiency, unspecified: Secondary | ICD-10-CM

## 2021-05-05 DIAGNOSIS — E538 Deficiency of other specified B group vitamins: Secondary | ICD-10-CM

## 2021-05-05 DIAGNOSIS — Z0001 Encounter for general adult medical examination with abnormal findings: Secondary | ICD-10-CM | POA: Diagnosis not present

## 2021-05-05 DIAGNOSIS — Z0000 Encounter for general adult medical examination without abnormal findings: Secondary | ICD-10-CM

## 2021-05-05 DIAGNOSIS — H6983 Other specified disorders of Eustachian tube, bilateral: Secondary | ICD-10-CM

## 2021-05-05 DIAGNOSIS — R1011 Right upper quadrant pain: Secondary | ICD-10-CM

## 2021-05-05 DIAGNOSIS — R739 Hyperglycemia, unspecified: Secondary | ICD-10-CM

## 2021-05-05 LAB — CBC WITH DIFFERENTIAL/PLATELET
Basophils Absolute: 0 10*3/uL (ref 0.0–0.1)
Basophils Relative: 0.9 % (ref 0.0–3.0)
Eosinophils Absolute: 0.2 10*3/uL (ref 0.0–0.7)
Eosinophils Relative: 3.4 % (ref 0.0–5.0)
HCT: 43.3 % (ref 36.0–46.0)
Hemoglobin: 14.4 g/dL (ref 12.0–15.0)
Lymphocytes Relative: 31.1 % (ref 12.0–46.0)
Lymphs Abs: 1.8 10*3/uL (ref 0.7–4.0)
MCHC: 33.2 g/dL (ref 30.0–36.0)
MCV: 84.5 fl (ref 78.0–100.0)
Monocytes Absolute: 0.5 10*3/uL (ref 0.1–1.0)
Monocytes Relative: 8 % (ref 3.0–12.0)
Neutro Abs: 3.2 10*3/uL (ref 1.4–7.7)
Neutrophils Relative %: 56.6 % (ref 43.0–77.0)
Platelets: 236 10*3/uL (ref 150.0–400.0)
RBC: 5.13 Mil/uL — ABNORMAL HIGH (ref 3.87–5.11)
RDW: 13.9 % (ref 11.5–15.5)
WBC: 5.7 10*3/uL (ref 4.0–10.5)

## 2021-05-05 LAB — HEMOGLOBIN A1C: Hgb A1c MFr Bld: 5.4 % (ref 4.6–6.5)

## 2021-05-05 MED ORDER — MECLIZINE HCL 12.5 MG PO TABS: 12.5000 mg | ORAL_TABLET | Freq: Three times a day (TID) | ORAL | 1 refills | Status: DC | PRN

## 2021-05-05 NOTE — Progress Notes (Deleted)
Patient ID: [name], female   DOB: [birth year], 55 y.o.   MRN: [id number]        Chief Complaint: follow up HTN, HLD and hyperglycemia ***       HPI:  [name] is a 55 y.o. female here with c/o          Plans to see Dr Hilarie Fredrickson soon right sided pain, due for f/u colnoscopy as well.   Wt Readings from Last 3 Encounters:  05/05/21 174 lb (78.9 kg)  05/01/21 171 lb (77.6 kg)  03/19/21 169 lb (76.7 kg)   BP Readings from Last 3 Encounters:  05/05/21 118/70  03/19/21 130/82  11/25/20 120/70         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   DIZZINESS 08/15/2008   DYSPNEA 05/20/2008   FATIGUE 05/20/2008   History of 2019 novel coronavirus disease (COVID-19) 05/23/2019   History of hiatal hernia    HSV infection    KNEE PAIN, LEFT 09/11/2007   Left sided sciatica 04/19/2011   Lumbar herniated disc    Lynch syndrome    MIGRAINE HEADACHE 04/09/2007   Ovarian cyst    Palpitations 05/20/2008   Pancreatic lesion    PONV (postoperative nausea and vomiting)    PREMATURE VENTRICULAR CONTRACTIONS 08/23/2008   RASH-NONVESICULAR 01/17/2008   RUQ PAIN 02/23/2010   SICKLE CELL TRAIT 04/09/2007   Tension headache 04/09/2007   VARICOSE VEINS LOWER EXTREMITIES W/OTH COMPS 09/11/2007   Wears glasses    Past Surgical History:  Procedure Laterality Date   CESAREAN SECTION  2004   COLONOSCOPY  2016   FINGER FRACTURE SURGERY Left 03/2019   LAPAROSCOPIC ASSISTED VAGINAL HYSTERECTOMY N/A 10/23/2012   Procedure: LAPAROSCOPIC ASSISTED VAGINAL HYSTERECTOMY;  Surgeon: Margarette Asal, MD;  Location: [facility];   Service: Gynecology;  Laterality: N/A;   TONSILLECTOMY  07/18/2009   TUBAL LIGATION  01/06/2012   with cyst removal   UPPER GASTROINTESTINAL ENDOSCOPY      reports that she has never smoked. She has never used smokeless tobacco. She reports current alcohol use. She reports that she does not use drugs. family history includes Brain cancer in her maternal aunt; Cancer in her cousin and mother; Heart attack in her father; Heart disease in her mother; Hypertension in her mother; Liver cancer in her maternal aunt; Lung cancer in her maternal aunt; Pancreatitis in her father. Allergies  Allergen Reactions   Morphine And Related Rash   Current Outpatient Medications on File Prior to Visit  Medication Sig Dispense Refill   PARoxetine (PAXIL) 10 MG tablet Take 10 mg by mouth daily.     zolpidem (AMBIEN) 10 MG tablet Take 1 tablet (10 mg total) by mouth at bedtime as needed for sleep. 90 tablet 1   Estradiol 10 MCG TABS vaginal tablet Imvexxy Maintenance Pack 10 mcg vaginal insert  Insert 1 vaginal insert twice a week by vaginal route for 28 days. (Patient not taking: No sig reported)     No current facility-administered medications on file prior to visit.  ROS:  All others reviewed and negative.  Objective        PE:  BP 118/70   Pulse 80   Ht 5\' 5"  (1.651 m)   Wt 174 lb (78.9 kg)   LMP 10/06/2012   SpO2 99%   BMI 28.96 kg/m                 Constitutional: Pt appears in NAD               HENT: Head: NCAT.                Right Ear: External ear normal.                 Left Ear: External ear normal.                Eyes: . Pupils are equal, round, and reactive to light. Conjunctivae and EOM are normal               Nose: without d/c or deformity               Neck: Neck supple. Gross normal ROM               Cardiovascular: Normal rate and regular rhythm.                 Pulmonary/Chest: Effort normal and breath sounds without rales or wheezing.                Abd:  Soft, NT, ND,  + BS, no organomegaly               Neurological: Pt is alert. At baseline orientation, motor grossly intact               Skin: Skin is warm. No rashes, no other new lesions, LE edema - ***               Psychiatric: Pt behavior is normal without agitation   Micro: none  Cardiac tracings I have personally interpreted today:  none  Pertinent Radiological findings (summarize): none   Lab Results  Component Value Date   WBC 4.5 06/04/2020   HGB 15.0 06/04/2020   HCT 44.8 06/04/2020   PLT 218.0 06/04/2020   GLUCOSE 80 06/04/2020   CHOL 152 06/04/2020   TRIG 56.0 06/04/2020   HDL 58.30 06/04/2020   LDLCALC 82 06/04/2020   ALT 17 06/04/2020   AST 20 06/04/2020   NA 138 06/04/2020   K 3.4 (L) 06/04/2020   CL 102 06/04/2020   CREATININE 0.83 06/04/2020   BUN 10 06/04/2020   CO2 24 06/04/2020   TSH 0.69 06/04/2020   INR 1.0 03/01/2007   Assessment/Plan:  [name] is a 55 y.o. Black or African American [2] female with  has a past medical history of ACUTE NASOPHARYNGITIS (05/07/2010), Allergic rhinitis, cause unspecified (04/19/2011), Allergy, Anemia, Anxiety, BACK PAIN (02/05/2010), CELLULITIS (01/13/2010), CHEST PAIN UNSPECIFIED (08/23/2008), CONSTIPATION, CHRONIC (04/09/2007), DEEP VENOUS THROMBOPHLEBITIS, LEFT, LEG, HX OF (04/09/2007), DEGENERATIVE DISC DISEASE, LUMBAR SPINE (05/12/2007), DIZZINESS (08/15/2008), DYSPNEA (05/20/2008), FATIGUE (05/20/2008), History of 2019 novel coronavirus disease (COVID-19) (05/23/2019), History of hiatal hernia, HSV infection, KNEE PAIN, LEFT (09/11/2007), Left sided sciatica (04/19/2011), Lumbar herniated disc, Lynch syndrome, MIGRAINE HEADACHE (04/09/2007), Ovarian cyst, Palpitations (05/20/2008), Pancreatic lesion, PONV (postoperative nausea and vomiting), PREMATURE VENTRICULAR CONTRACTIONS (08/23/2008), RASH-NONVESICULAR (01/17/2008), RUQ PAIN (02/23/2010), SICKLE CELL TRAIT (04/09/2007), Tension headache (04/09/2007), VARICOSE VEINS LOWER  EXTREMITIES W/OTH COMPS (09/11/2007),  and Wears glasses.  No problem-specific Assessment & Plan notes found for this encounter.  Followup: No follow-ups on file.  Cathlean Cower, MD 05/05/2021 4:26 PM [facility]

## 2021-05-05 NOTE — Patient Instructions (Signed)
Please take all new medication as prescribed - the vertigo medication  Please continue all other medications as before, and refills have been done if requested.  Please have the pharmacy call with any other refills you may need.  Please continue your efforts at being more active, low cholesterol diet, and weight control.  You are otherwise up to date with prevention measures today.  Please keep your appointments with your specialists as you may have planned  Please go to the LAB at the blood drawing area for the tests to be done  You will be contacted by phone if any changes need to be made immediately.  Otherwise, you will receive a letter about your results with an explanation, but please check with MyChart first.  Please remember to sign up for MyChart if you have not done so, as this will be important to you in the future with finding out test results, communicating by private email, and scheduling acute appointments online when needed.  Please make an Appointment to return in 6 months, or sooner if needed

## 2021-05-06 ENCOUNTER — Encounter: Payer: Self-pay | Admitting: Internal Medicine

## 2021-05-06 LAB — VITAMIN B12: Vitamin B-12: 383 pg/mL (ref 211–911)

## 2021-05-06 LAB — URINALYSIS, ROUTINE W REFLEX MICROSCOPIC
Bilirubin Urine: NEGATIVE
Hgb urine dipstick: NEGATIVE
Ketones, ur: NEGATIVE
Leukocytes,Ua: NEGATIVE
Nitrite: NEGATIVE
RBC / HPF: NONE SEEN (ref 0–?)
Specific Gravity, Urine: 1.02 (ref 1.000–1.030)
Total Protein, Urine: NEGATIVE
Urine Glucose: NEGATIVE
Urobilinogen, UA: 0.2 (ref 0.0–1.0)
pH: 6 (ref 5.0–8.0)

## 2021-05-06 LAB — HEPATIC FUNCTION PANEL
ALT: 19 U/L (ref 0–35)
AST: 22 U/L (ref 0–37)
Albumin: 4.8 g/dL (ref 3.5–5.2)
Alkaline Phosphatase: 56 U/L (ref 39–117)
Bilirubin, Direct: 0 mg/dL (ref 0.0–0.3)
Total Bilirubin: 0.3 mg/dL (ref 0.2–1.2)
Total Protein: 7.8 g/dL (ref 6.0–8.3)

## 2021-05-06 LAB — BASIC METABOLIC PANEL
BUN: 15 mg/dL (ref 6–23)
CO2: 27 mEq/L (ref 19–32)
Calcium: 10.3 mg/dL (ref 8.4–10.5)
Chloride: 104 mEq/L (ref 96–112)
Creatinine, Ser: 1.03 mg/dL (ref 0.40–1.20)
GFR: 62.06 mL/min (ref >60.00)
Glucose, Bld: 73 mg/dL (ref 70–99)
Potassium: 4.2 mEq/L (ref 3.5–5.1)
Sodium: 141 mEq/L (ref 135–145)

## 2021-05-06 LAB — LIPID PANEL
Cholesterol: 182 mg/dL (ref 0–200)
HDL: 87 mg/dL (ref >39.00)
LDL Cholesterol: 84 mg/dL (ref 0–99)
NonHDL: 94.76
Total CHOL/HDL Ratio: 2
Triglycerides: 52 mg/dL (ref 0.0–149.0)
VLDL: 10.4 mg/dL (ref 0.0–40.0)

## 2021-05-06 LAB — TSH: TSH: 0.93 u[IU]/mL (ref 0.35–5.50)

## 2021-05-06 LAB — VITAMIN D 25 HYDROXY (VIT D DEFICIENCY, FRACTURES): VITD: 34.46 ng/mL (ref 30.00–100.00)

## 2021-05-06 NOTE — Progress Notes (Signed)
The test results show that your current treatment is OK, as the tests are stable , except the Vitamin D level is still no the low side   There is no other need for change of treatment or further evaluation based on these results, at this time.  thanks

## 2021-05-10 ENCOUNTER — Encounter: Payer: Self-pay | Admitting: Internal Medicine

## 2021-05-10 DIAGNOSIS — E559 Vitamin D deficiency, unspecified: Secondary | ICD-10-CM | POA: Insufficient documentation

## 2021-05-10 DIAGNOSIS — H6983 Other specified disorders of Eustachian tube, bilateral: Secondary | ICD-10-CM | POA: Insufficient documentation

## 2021-05-10 DIAGNOSIS — R109 Unspecified abdominal pain: Secondary | ICD-10-CM | POA: Insufficient documentation

## 2021-05-10 DIAGNOSIS — H6993 Unspecified Eustachian tube disorder, bilateral: Secondary | ICD-10-CM | POA: Insufficient documentation

## 2021-05-10 NOTE — Assessment & Plan Note (Signed)
?   GB vs renal stone vs other - for labs as ordered today, consdier imaging as well

## 2021-05-10 NOTE — Progress Notes (Signed)
Patient ID: [name], female   DOB: 12-Aug-1965, 55 y.o.   MRN: [id number]         Chief Complaint:: wellness exam and Dizziness (X 1 week, slight headache.)  , bilateral eustachian symptoms, low vit d, right abd/side pain       HPI:  [name] is a 55 y.o. female here for wellness exam; declines covid booster, mammogram, shignrx, flu shot, pneumovax and colonoscpy, o/w up to date                        Also has bilateral ear popping and crackling without ear pain or tinnitus; but does have postional vertigo mild to mod but persistent over the past 2k, nothing makes better.  Not taking Vit D.  Also has right sided dull pressure pain without radaition, n/v, fever, intermittent for 3 days, and no bowel change, worsenign reflux or other abd pain.  Pt denies chest pain, increased sob or doe, wheezing, orthopnea, PND, increased LE swelling, palpitations, dizziness or syncope.  Pt denies polydipsia, polyuria, or new focal neuro s/s.    Wt Readings from Last 3 Encounters:  05/05/21 174 lb (78.9 kg)  05/01/21 171 lb (77.6 kg)  03/19/21 169 lb (76.7 kg)   BP Readings from Last 3 Encounters:  05/05/21 118/70  03/19/21 130/82  11/25/20 120/70   Immunization History  Administered Date(s) Administered   Influenza Split 04/19/2011   Influenza Whole 04/22/2004   PFIZER(Purple Top)SARS-COV-2 Vaccination 10/25/2019, 11/26/2019   Td 08/15/2008   Tdap 02/02/2019   There are no preventive care reminders to display for this patient.     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   DIZZINESS 08/15/2008   DYSPNEA  05/20/2008   FATIGUE 05/20/2008   History of 2019 novel coronavirus disease (COVID-19) 05/23/2019   History of hiatal hernia    HSV infection    KNEE PAIN, LEFT 09/11/2007   Left sided sciatica 04/19/2011   Lumbar herniated disc    Lynch syndrome    MIGRAINE HEADACHE 04/09/2007   Ovarian cyst    Palpitations 05/20/2008   Pancreatic lesion    PONV (postoperative nausea and vomiting)    PREMATURE VENTRICULAR CONTRACTIONS 08/23/2008   RASH-NONVESICULAR 01/17/2008   RUQ PAIN 02/23/2010   SICKLE CELL TRAIT 04/09/2007   Tension headache 04/09/2007   VARICOSE VEINS LOWER EXTREMITIES W/OTH COMPS 09/11/2007   Wears glasses    Past Surgical History:  Procedure Laterality Date   CESAREAN SECTION  2004   COLONOSCOPY  2016   FINGER FRACTURE SURGERY Left 03/2019   LAPAROSCOPIC ASSISTED VAGINAL HYSTERECTOMY N/A 10/23/2012   Procedure: LAPAROSCOPIC ASSISTED VAGINAL HYSTERECTOMY;  Surgeon: Margarette Asal, MD;  Location: [facility];  Service: Gynecology;  Laterality: N/A;   TONSILLECTOMY  07/18/2009   TUBAL LIGATION  01/06/2012   with cyst removal   UPPER GASTROINTESTINAL ENDOSCOPY      reports that she has never smoked. She has never used smokeless tobacco. She reports current alcohol use. She reports that she does not use drugs. family history includes Brain  cancer in her maternal aunt; Cancer in her cousin and mother; Heart attack in her father; Heart disease in her mother; Hypertension in her mother; Liver cancer in her maternal aunt; Lung cancer in her maternal aunt; Pancreatitis in her father. Allergies  Allergen Reactions   Morphine And Related Rash   Current Outpatient Medications on File Prior to Visit  Medication Sig Dispense Refill   PARoxetine (PAXIL) 10 MG tablet Take 10 mg by mouth daily.     zolpidem (AMBIEN) 10 MG tablet Take 1 tablet (10 mg total) by mouth at bedtime as needed for sleep. 90 tablet 1   Estradiol 10 MCG TABS vaginal tablet Imvexxy Maintenance Pack 10 mcg vaginal  insert  Insert 1 vaginal insert twice a week by vaginal route for 28 days. (Patient not taking: No sig reported)     No current facility-administered medications on file prior to visit.        ROS:  All others reviewed and negative.  Objective        PE:  BP 118/70   Pulse 80   Ht 5\' 5"  (1.651 m)   Wt 174 lb (78.9 kg)   LMP 10/06/2012   SpO2 99%   BMI 28.96 kg/m                 Constitutional: Pt appears in NAD               HENT: Head: NCAT.                Right Ear: External ear normal.                 Left Ear: External ear normal. Bilateral TMs mild erythema               Eyes: . Pupils are equal, round, and reactive to light. Conjunctivae and EOM are normal               Nose: without d/c or deformity               Neck: Neck supple. Gross normal ROM               Cardiovascular: Normal rate and regular rhythm.                 Pulmonary/Chest: Effort normal and breath sounds without rales or wheezing.                Abd:  Soft, NT, ND, + BS, no organomegaly               Neurological: Pt is alert. At baseline orientation, motor grossly intact               Skin: Skin is warm. No rashes, no other new lesions, LE edema - none               Psychiatric: Pt behavior is normal without agitation   Micro: none  Cardiac tracings I have personally interpreted today:  none  Pertinent Radiological findings (summarize): none   Lab Results  Component Value Date   WBC 5.7 05/05/2021   HGB 14.4 05/05/2021   HCT 43.3 05/05/2021   PLT 236.0 05/05/2021   GLUCOSE 73 05/05/2021   CHOL 182 05/05/2021   TRIG 52.0 05/05/2021   HDL 87.00 05/05/2021   LDLCALC 84 05/05/2021   ALT 19 05/05/2021   AST 22 05/05/2021   NA 141 05/05/2021   K 4.2 05/05/2021  CL 104 05/05/2021   CREATININE 1.03 05/05/2021   BUN 15 05/05/2021   CO2 27 05/05/2021   TSH 0.93 05/05/2021   INR 1.0 03/01/2007   HGBA1C 5.4 05/05/2021   Assessment/Plan:  [name] is a 55 y.o. Black or African American  [2] female with  has a past medical history of ACUTE NASOPHARYNGITIS (05/07/2010), Allergic rhinitis, cause unspecified (04/19/2011), Allergy, Anemia, Anxiety, BACK PAIN (02/05/2010), CELLULITIS (01/13/2010), CHEST PAIN UNSPECIFIED (08/23/2008), CONSTIPATION, CHRONIC (04/09/2007), DEEP VENOUS THROMBOPHLEBITIS, LEFT, LEG, HX OF (04/09/2007), DEGENERATIVE DISC DISEASE, LUMBAR SPINE (05/12/2007), DIZZINESS (08/15/2008), DYSPNEA (05/20/2008), FATIGUE (05/20/2008), History of 2019 novel coronavirus disease (COVID-19) (05/23/2019), History of hiatal hernia, HSV infection, KNEE PAIN, LEFT (09/11/2007), Left sided sciatica (04/19/2011), Lumbar herniated disc, Lynch syndrome, MIGRAINE HEADACHE (04/09/2007), Ovarian cyst, Palpitations (05/20/2008), Pancreatic lesion, PONV (postoperative nausea and vomiting), PREMATURE VENTRICULAR CONTRACTIONS (08/23/2008), RASH-NONVESICULAR (01/17/2008), RUQ PAIN (02/23/2010), SICKLE CELL TRAIT (04/09/2007), Tension headache (04/09/2007), VARICOSE VEINS LOWER EXTREMITIES W/OTH COMPS (09/11/2007), and Wears glasses.  Encounter for well adult exam with abnormal findings Age and sex appropriate education and counseling updated with regular exercise and diet Referrals for preventative services - declines colonoscopy, mammogram Immunizations addressed - declines covid booster, shingrix, flu shot and pneumovax Smoking counseling  - none needed Evidence for depression or other mood disorder - none significant Most recent labs reviewed. I have personally reviewed and have noted: 1) the patient's medical and social history 2) The patient's current medications and supplements 3) The patient's height, weight, and BMI have been recorded in the chart   DIZZINESS C/w vertigo - for antivert prn  Eustachian tube dysfunction, bilateral For mucinex bid prn  Abdominal pain ? GB vs renal stone vs other - for labs as ordered today, consdier imaging as well  Vitamin D deficiency Last  vitamin D Lab Results  Component Value Date   VD25OH 34.46 05/05/2021   Low, to start oral replacement  Followup: Return in about 6 months (around 11/03/2021).  Cathlean Cower, MD 05/10/2021 9:00 PM [facility]

## 2021-05-10 NOTE — Assessment & Plan Note (Signed)
Age and sex appropriate education and counseling updated with regular exercise and diet Referrals for preventative services - declines colonoscopy, mammogram Immunizations addressed - declines covid booster, shingrix, flu shot and pneumovax Smoking counseling  - none needed Evidence for depression or other mood disorder - none significant Most recent labs reviewed. I have personally reviewed and have noted: 1) the patient's medical and social history 2) The patient's current medications and supplements 3) The patient's height, weight, and BMI have been recorded in the chart

## 2021-05-10 NOTE — Assessment & Plan Note (Signed)
C/w vertigo - for antivert prn

## 2021-05-10 NOTE — Assessment & Plan Note (Signed)
Last vitamin D Lab Results  Component Value Date   VD25OH 34.46 05/05/2021   Low, to start oral replacement  

## 2021-05-10 NOTE — Assessment & Plan Note (Signed)
For mucinex bid prn 

## 2021-06-02 ENCOUNTER — Telehealth: Payer: Self-pay | Admitting: Internal Medicine

## 2021-06-02 NOTE — Telephone Encounter (Signed)
Good afternoon Dr. Hilarie Fredrickson, just wanted to inform you that patient called to cancel her appointment for 06/04/21 due to a scheduling conflict.    She rescheduled for 06/18/21

## 2021-06-04 ENCOUNTER — Encounter: Payer: Managed Care, Other (non HMO) | Admitting: Internal Medicine

## 2021-06-18 ENCOUNTER — Ambulatory Visit (AMBULATORY_SURGERY_CENTER): Payer: Managed Care, Other (non HMO) | Admitting: Internal Medicine

## 2021-06-18 ENCOUNTER — Encounter: Payer: Self-pay | Admitting: Internal Medicine

## 2021-06-18 ENCOUNTER — Other Ambulatory Visit: Payer: Self-pay

## 2021-06-18 VITALS — BP 108/66 | HR 60 | Temp 97.7°F | Resp 14 | Ht 65.0 in | Wt 169.0 lb

## 2021-06-18 DIAGNOSIS — Z1509 Genetic susceptibility to other malignant neoplasm: Secondary | ICD-10-CM

## 2021-06-18 DIAGNOSIS — Z1211 Encounter for screening for malignant neoplasm of colon: Secondary | ICD-10-CM | POA: Diagnosis not present

## 2021-06-18 DIAGNOSIS — K635 Polyp of colon: Secondary | ICD-10-CM

## 2021-06-18 DIAGNOSIS — D124 Benign neoplasm of descending colon: Secondary | ICD-10-CM

## 2021-06-18 DIAGNOSIS — N840 Polyp of corpus uteri: Secondary | ICD-10-CM

## 2021-06-18 MED ORDER — SODIUM CHLORIDE 0.9 % IV SOLN: 500.0000 mL | Freq: Once | INTRAVENOUS | Status: DC

## 2021-06-18 NOTE — Progress Notes (Signed)
Called to room to assist during endoscopic procedure.  Patient ID and intended procedure confirmed with present staff. Received instructions for my participation in the procedure from the performing physician.  
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
## 2021-06-18 NOTE — Progress Notes (Signed)
Pt's states no medical or surgical changes since previsit or office visit. VS by CW. 

## 2021-06-18 NOTE — Patient Instructions (Signed)
YOU HAD AN ENDOSCOPIC PROCEDURE TODAY AT THE [facility]:   Refer to the procedure report that was given to you for any specific questions about what was found during the examination.  If the procedure report does not answer your questions, please call your gastroenterologist to clarify.  If you requested that your care partner not be given the details of your procedure findings, then the procedure report has been included in a sealed envelope for you to review at your convenience later.  YOU SHOULD EXPECT: Some feelings of bloating in the abdomen. Passage of more gas than usual.  Walking can help get rid of the air that was put into your GI tract during the procedure and reduce the bloating. If you had a lower endoscopy (such as a colonoscopy or flexible sigmoidoscopy) you may notice spotting of blood in your stool or on the toilet paper. If you underwent a bowel prep for your procedure, you may not have a normal bowel movement for a few days.  Please Note:  You might notice some irritation and congestion in your nose or some drainage.  This is from the oxygen used during your procedure.  There is no need for concern and it should clear up in a day or so.  SYMPTOMS TO REPORT IMMEDIATELY:  Following lower endoscopy (colonoscopy or flexible sigmoidoscopy):  Excessive amounts of blood in the stool  Significant tenderness or worsening of abdominal pains  Swelling of the abdomen that is new, acute  Fever of 100F or higher    For urgent or emergent issues, a gastroenterologist can be reached at any hour by calling [phone]. Do not use MyChart messaging for urgent concerns.    DIET:  We do recommend a small meal at first, but then you may proceed to your regular diet.  Drink plenty of fluids but you should avoid alcoholic beverages for 24 hours.  ACTIVITY:  You should plan to take it easy for the rest of today and you should NOT DRIVE or use heavy machinery until tomorrow (because  of the sedation medicines used during the test).    FOLLOW UP: Our staff will call the number listed on your records 48-72 hours following your procedure to check on you and address any questions or concerns that you may have regarding the information given to you following your procedure. If we do not reach you, we will leave a message.  We will attempt to reach you two times.  During this call, we will ask if you have developed any symptoms of COVID 19. If you develop any symptoms (ie: fever, flu-like symptoms, shortness of breath, cough etc.) before then, please call [phone].  If you test positive for Covid 19 in the 2 weeks post procedure, please call and report this information to us.    If any biopsies were taken you will be contacted by phone or by letter within the next 1-3 weeks.  Please call us at [phone] if you have not heard about the biopsies in 3 weeks.    SIGNATURES/CONFIDENTIALITY: You and/or your care partner have signed paperwork which will be entered into your electronic medical record.  These signatures attest to the fact that that the information above on your After Visit Summary has been reviewed and is understood.  Full responsibility of the confidentiality of this discharge information lies with you and/or your care-partner.    Resume medications. Information given on polyps. 

## 2021-06-18 NOTE — Op Note (Signed)
[facility] Patient Name: Merlyn Bollen Procedure Date: 06/18/2021 2:34 PM MRN: [id number] Endoscopist: Jerene Bears , MD Age: 55 Referring MD:  Date of Birth: 08-Mar-1966 Gender: Female Account #: [id number] Procedure:                Colonoscopy Indications:              Last colonoscopy: June 2021, Lynch Syndrome Medicines:                Monitored Anesthesia Care Procedure:                Pre-Anesthesia Assessment:                           - Prior to the procedure, a History and Physical                            was performed, and patient medications and                            allergies were reviewed. The patient's tolerance of                            previous anesthesia was also reviewed. The risks                            and benefits of the procedure and the sedation                            options and risks were discussed with the patient.                            All questions were answered, and informed consent                            was obtained. Prior Anticoagulants: The patient has                            taken no previous anticoagulant or antiplatelet                            agents. ASA Grade Assessment: II - A patient with                            mild systemic disease. After reviewing the risks                            and benefits, the patient was deemed in                            satisfactory condition to undergo the procedure.                           After obtaining informed consent, the colonoscope  was passed under direct vision. Throughout the                            procedure, the patient's blood pressure, pulse, and                            oxygen saturations were monitored continuously. The                            [id number]-HQ190L Colonoscope was introduced through the                            anus and advanced to the terminal ileum. The                            colonoscopy was performed  without difficulty. The                            patient tolerated the procedure well. The quality                            of the bowel preparation was excellent. The                            terminal ileum, ileocecal valve, appendiceal                            orifice, and rectum were photographed. Scope In: 2:55:11 PM Scope Out: 3:10:00 PM Scope Withdrawal Time: 0 hours 10 minutes 5 seconds  Total Procedure Duration: 0 hours 14 minutes 49 seconds  Findings:                 The digital rectal exam was normal.                           The terminal ileum appeared normal.                           A 3 mm polyp was found in the descending colon. The                            polyp was sessile. The polyp was removed with a                            cold snare. Resection and retrieval were complete.                           The exam was otherwise without abnormality on                            direct and retroflexion views. Complications:            No immediate complications. Estimated Blood Loss:     Estimated blood loss: none. Impression:               -  The examined portion of the ileum was normal.                           - One 3 mm polyp in the descending colon, removed                            with a cold snare. Resected and retrieved.                           - The examination was otherwise normal on direct                            and retroflexion views. Recommendation:           - Patient has a contact number available for                            emergencies. The signs and symptoms of potential                            delayed complications were discussed with the                            patient. Return to normal activities tomorrow.                            Written discharge instructions were provided to the                            patient.                           - Resume previous diet.                           - Continue present  medications.                           - Await pathology results.                           - Repeat colonoscopy in 1 year for screening                            purposes. Jerene Bears, MD 06/18/2021 3:12:25 PM This report has been signed electronically.

## 2021-06-18 NOTE — Progress Notes (Signed)
Pt stable to RR report given

## 2021-06-20 ENCOUNTER — Encounter (HOSPITAL_BASED_OUTPATIENT_CLINIC_OR_DEPARTMENT_OTHER): Payer: Self-pay | Admitting: *Deleted

## 2021-06-20 ENCOUNTER — Other Ambulatory Visit: Payer: Self-pay

## 2021-06-20 ENCOUNTER — Emergency Department (HOSPITAL_BASED_OUTPATIENT_CLINIC_OR_DEPARTMENT_OTHER)
Admission: EM | Admit: 2021-06-20 | Discharge: 2021-06-20 | Disposition: A | Discharge status: Home / Self Care | Payer: Managed Care, Other (non HMO) | Attending: Emergency Medicine | Admitting: Emergency Medicine

## 2021-06-20 DIAGNOSIS — H538 Other visual disturbances: Secondary | ICD-10-CM | POA: Insufficient documentation

## 2021-06-20 DIAGNOSIS — B029 Zoster without complications: Secondary | ICD-10-CM | POA: Diagnosis not present

## 2021-06-20 DIAGNOSIS — R21 Rash and other nonspecific skin eruption: Secondary | ICD-10-CM | POA: Diagnosis present

## 2021-06-20 MED ORDER — DIPHENHYDRAMINE HCL 50 MG/ML IJ SOLN
50.0000 mg | Freq: Once | INTRAMUSCULAR | Status: DC
  Filled 2021-06-20: qty 1

## 2021-06-20 MED ORDER — VALACYCLOVIR HCL 1 G PO TABS: 1000.0000 mg | ORAL_TABLET | Freq: Three times a day (TID) | ORAL | 0 refills | Status: DC

## 2021-06-20 MED ORDER — TETRACAINE HCL 0.5 % OP SOLN: 2.0000 [drp] | Freq: Once | OPHTHALMIC | Status: AC

## 2021-06-20 MED ORDER — FLUORESCEIN SODIUM 1 MG OP STRP
1.0000 | ORAL_STRIP | Freq: Once | OPHTHALMIC | Status: AC
  Administered 2021-06-20: 1 via OPHTHALMIC
  Filled 2021-06-20: qty 1

## 2021-06-20 MED ORDER — HYDROCODONE-ACETAMINOPHEN 5-325 MG PO TABS: 1.0000 | ORAL_TABLET | Freq: Four times a day (QID) | ORAL | 0 refills | Status: AC | PRN

## 2021-06-20 MED ORDER — TETRACAINE HCL 0.5 % OP SOLN
OPHTHALMIC | Status: AC
  Administered 2021-06-20: 2 [drp] via OPHTHALMIC
  Filled 2021-06-20: qty 4

## 2021-06-20 MED ORDER — HYDROCODONE-ACETAMINOPHEN 5-325 MG PO TABS
1.0000 | ORAL_TABLET | Freq: Once | ORAL | Status: AC
  Administered 2021-06-20: 1 via ORAL
  Filled 2021-06-20: qty 1

## 2021-06-20 MED ORDER — VALACYCLOVIR HCL 500 MG PO TABS
1000.0000 mg | ORAL_TABLET | Freq: Once | ORAL | Status: AC
  Administered 2021-06-20: 1000 mg via ORAL
  Filled 2021-06-20: qty 2

## 2021-06-20 MED ORDER — METOCLOPRAMIDE HCL 5 MG/ML IJ SOLN
10.0000 mg | Freq: Once | INTRAMUSCULAR | Status: DC
  Filled 2021-06-20: qty 2

## 2021-06-20 NOTE — ED Provider Notes (Signed)
[facility] EMERGENCY DEPARTMENT Provider Note   CSN: [id number] Arrival date & time: 06/20/21  1534     History Chief Complaint  Patient presents with   Headache   Rash    [name] is a 55 y.o. female presents emergency department with a chief complaint of pain to the right side of her head and rash around her right eye.  Patient reports that headache has been present over the last 3 days.  Pain has been constant over this time.  Pain started after waking from sleep.  Pain has been unchanged over the last 3 days.  Patient reports no relief of symptoms with Tylenol.  Patient reports that pain is worse with touch.  Patient rates pain 7/10 on the pain scale.  Patient states that her right eye has felt "weak," and she has had occasional blurry vision over the last 3 days.  Patient states that she noticed a rash around her right eye earlier today.  Denies any pain or pruritus associated with rash.  Patient also complains of a "bump" to her scalp.  Patient does not wear contacts  Denies any numbness, weakness, facial asymmetry, seizures, syncope, dysarthria.  Denies any recent falls or injuries.   Headache Associated symptoms: no abdominal pain, no back pain, no eye pain, no fever, no nausea, no neck pain, no numbness, no photophobia, no seizures, no vomiting and no weakness   Rash Associated symptoms: headaches   Associated symptoms: no abdominal pain, no fever, no nausea, no shortness of breath and not vomiting       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   DIZZINESS 08/15/2008   DYSPNEA  05/20/2008   FATIGUE 05/20/2008   History of 2019 novel coronavirus disease (COVID-19) 05/23/2019   History of hiatal hernia    HSV infection    KNEE PAIN, LEFT 09/11/2007   Left sided sciatica 04/19/2011   Lumbar herniated disc    Lynch syndrome    MIGRAINE HEADACHE 04/09/2007   Ovarian cyst    Palpitations 05/20/2008   Pancreatic lesion    PONV (postoperative nausea and vomiting)    PREMATURE VENTRICULAR CONTRACTIONS 08/23/2008   RASH-NONVESICULAR 01/17/2008   RUQ PAIN 02/23/2010   SICKLE CELL TRAIT 04/09/2007   Tension headache 04/09/2007   VARICOSE VEINS LOWER EXTREMITIES W/OTH COMPS 09/11/2007   Wears glasses     Patient Active Problem List   Diagnosis Date Noted   Eustachian tube dysfunction, bilateral 05/10/2021   Abdominal pain 05/10/2021   Vitamin D deficiency 05/10/2021   Lynch syndrome 05/01/2021   Acute pharyngitis 03/22/2021   Cough 12/02/2020   Insomnia 12/02/2020   Arthralgia of right elbow 09/20/2018   Venous (peripheral) insufficiency 12/18/2017   Nausea without vomiting 05/27/2014   Pelvic pain 12/27/2013   Adenomyosis 10/24/2012   Facial weakness 10/04/2012   Tongue fasciculation 10/04/2012   Tremor 10/04/2011   Left facial pain 01/13/2011   Encounter for well adult exam with abnormal findings 01/13/2011   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   BACK PAIN 02/05/2010   PREMATURE VENTRICULAR CONTRACTIONS 08/23/2008  Chest pain 08/23/2008   DIZZINESS 08/15/2008   FATIGUE 05/20/2008   Palpitations 05/20/2008   TONSILLITIS, CHRONIC 03/13/2008   KNEE PAIN, LEFT 09/11/2007   DEGENERATIVE Kirby DISEASE, 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   SICKLE CELL TRAIT 04/09/2007   MIGRAINE HEADACHE 04/09/2007   DEEP VENOUS THROMBOPHLEBITIS, LEFT, LEG, HX OF 04/09/2007    Past Surgical History:  Procedure Laterality Date   CESAREAN SECTION  2004   COLONOSCOPY  2016   FINGER FRACTURE SURGERY Left 03/2019   LAPAROSCOPIC ASSISTED VAGINAL HYSTERECTOMY N/A 10/23/2012    Procedure: LAPAROSCOPIC ASSISTED VAGINAL HYSTERECTOMY;  Surgeon: Margarette Asal, MD;  Location: [facility];  Service: Gynecology;  Laterality: N/A;   TONSILLECTOMY  07/18/2009   TUBAL LIGATION  01/06/2012   with cyst removal   UPPER GASTROINTESTINAL ENDOSCOPY       OB History     Gravida  3   Para  3   Term      Preterm      AB      Living  3      SAB      IAB      Ectopic      Multiple      Live Births              Family History  Problem Relation Age of Onset   Heart disease Mother    Hypertension Mother    Cancer Mother        ovarian    Heart attack Father    Pancreatitis Father    Liver cancer Maternal Aunt    Brain cancer Maternal Aunt    Lung cancer Maternal Aunt    Cancer Cousin        breast   Colon cancer Neg Hx    Colon polyps Neg Hx    Esophageal cancer Neg Hx    Rectal cancer Neg Hx    Stomach cancer Neg Hx     Social History   Tobacco Use   Smoking status: Never   Smokeless tobacco: Never  Vaping Use   Vaping Use: Never used  Substance Use Topics   Alcohol use: Yes    Comment: socially rare every 1-2 months   Drug use: No    Home Medications Prior to Admission medications   Medication Sig Start Date End Date Taking? Authorizing Provider  Estradiol 10 MCG TABS vaginal tablet Imvexxy Maintenance Pack 10 mcg vaginal insert  Insert 1 vaginal insert twice a week by vaginal route for 28 days. Patient not taking: Reported on 05/01/2021    [provider]  meclizine (ANTIVERT) 12.5 MG tablet Take 1 tablet (12.5 mg total) by mouth 3 (three) times daily as needed for dizziness. 05/05/21 05/05/22  Biagio Borg, MD  PARoxetine (PAXIL) 10 MG tablet Take 10 mg by mouth daily.    [provider]  zolpidem (AMBIEN) 10 MG tablet Take 1 tablet (10 mg total) by mouth at bedtime as needed for sleep. 10/09/20 05/05/21  Biagio Borg, MD    Allergies    Morphine and related  Review of Systems   Review of Systems   Constitutional:  Negative for chills and fever.  HENT:  Negative for facial swelling.   Eyes:  Positive for visual disturbance. Negative for photophobia, pain, discharge, redness and itching.  Respiratory:  Negative for shortness of breath.   Cardiovascular:  Negative for chest pain.  Gastrointestinal:  Negative for abdominal pain, nausea and vomiting.  Musculoskeletal:  Negative  for back pain and neck pain.  Skin:  Positive for rash. Negative for color change.  Neurological:  Positive for headaches. Negative for tremors, seizures, syncope, facial asymmetry, speech difficulty, weakness, light-headedness and numbness.  Psychiatric/Behavioral:  Negative for confusion.    Physical Exam Updated Vital Signs BP (!) 120/103 (BP Location: Left Arm)   Pulse 80   Temp 97.8 F (36.6 C) (Oral)   Resp 16   Ht 5\' 5"  (1.651 m)   Wt 76.7 kg   LMP 10/06/2012   SpO2 100%   BMI 28.12 kg/m   Physical Exam Vitals and nursing note reviewed.  Constitutional:      General: She is not in acute distress.    Appearance: She is not ill-appearing, toxic-appearing or diaphoretic.  HENT:     Head: Normocephalic. No right periorbital erythema or left periorbital erythema.      Comments: Patient had vesicular rash around right eye extending to right forehead.  Area of bogginess noted to scalp above.  No surrounding erythema or rash. Eyes:     General: No scleral icterus.       Right eye: No discharge.        Left eye: No discharge.     Extraocular Movements: Extraocular movements intact.     Conjunctiva/sclera: Conjunctivae normal.     Pupils: Pupils are equal, round, and reactive to light.     Right eye: Pupil is round, reactive and not sluggish. No corneal abrasion or fluorescein uptake.     Left eye: Pupil is round, reactive and not sluggish. No corneal abrasion or fluorescein uptake.     Comments: No fluorescein dye uptake.  No dendritic lesions noted to the cornea bilaterally.  No pain with EOM   Cardiovascular:     Rate and Rhythm: Normal rate.  Pulmonary:     Effort: Pulmonary effort is normal.  Abdominal:     Palpations: Abdomen is soft.     Tenderness: There is no abdominal tenderness.  Musculoskeletal:     Cervical back: Normal range of motion and neck supple. No rigidity.  Lymphadenopathy:     Cervical: No cervical adenopathy.  Skin:    General: Skin is warm and dry.  Neurological:     General: No focal deficit present.     Mental Status: She is alert and oriented to person, place, and time.     GCS: GCS eye subscore is 4. GCS verbal subscore is 5. GCS motor subscore is 6.     Cranial Nerves: No cranial nerve deficit or facial asymmetry.     Motor: No weakness, tremor, seizure activity or pronator drift.     Coordination: Romberg sign negative. Finger-Nose-Finger Test normal.     Gait: Gait is intact. Gait normal.     Comments: CN II-XII intact, equal grip strength, +5 strength to bilateral upper and lower extremities   Psychiatric:        Behavior: Behavior is cooperative.    ED Results / Procedures / Treatments   Labs (all labs ordered are listed, but only abnormal results are displayed) Labs Reviewed - No data to display  EKG None  Radiology No results found.  Procedures Procedures   Medications Ordered in ED Medications - No data to display  ED Course  I have reviewed the triage vital signs and the nursing notes.  Pertinent labs & imaging results that were available during my care of the patient were reviewed by me and considered in my medical decision making (  see chart for details).  Clinical Course as of 06/21/21 9735  Sat Jun 20, 2021  1709 Spoke to ophthalmologist Dr. Katy Fitch who advised to continue valacyclovir.  Patient will need to follow-up with ophthalmology in outpatient setting sometime this week. [PB]    Clinical Course User Index [PB] Dyann Ruddle   MDM Rules/Calculators/A&P                           Alert  55 year old female no acute distress, nontoxic-appearing.  Presents to ED with chief complaint of pain and rash to the right side of her head.  Pain has been present over the last 3 days.  Rash developed today  On physical exam patient has vesicular rash around right eye extending to right forehead and temple.  Patient reports pain with palpation to affected area.  Due to patient's complaints of pain and vesicular rash concern for herpes zoster ophthalmic.  We will obtain fluorescein to assess for corneal abrasion dendritic lesions.  No fluorescein dye uptake noted on exam.  Spoke to on-call ophthalmologist who recommended treatment with valacyclovir and follow-up in outpatient setting.  We will treat patient with 7-day course of valacyclovir.  Patient given short course of pain medication.  Importance of follow-up with ophthalmology stressed to patient.  Discussed results, findings, treatment and follow up. Patient advised of return precautions. Patient verbalized understanding and agreed with plan.  Patient was discussed with and evaluated by Dr. Billy Fischer.   Final Clinical Impression(s) / ED Diagnoses Final diagnoses:  Herpes zoster without complication    Rx / DC Orders ED Discharge Orders          Ordered    valACYclovir (VALTREX) 1000 MG tablet  3 times daily        06/20/21 1750    HYDROcodone-acetaminophen (NORCO/VICODIN) 5-325 MG tablet  Every 6 hours PRN        06/20/21 1750             Loni Beckwith, PA-C 06/21/21 0315    Gareth Morgan, MD 06/22/21 1438

## 2021-06-20 NOTE — ED Notes (Signed)
ED Provider at bedside. 

## 2021-06-20 NOTE — ED Triage Notes (Signed)
Headache x 3 days on left side of head. Also reports rash around left eye and tenderness to scalp

## 2021-06-20 NOTE — Discharge Instructions (Addendum)
You came to the emergency department today to be evaluated for your headache and rash.  Your physical exam is consistent with herpes zoster also known as shingles.  Due to this she was started on the medication valacyclovir.  Please take this medication as prescribed.  I have also given you a prescription for Norco to help with your pain.  Please read attached paperwork on this medication.  You will need to follow-up closely with ophthalmology in the outpatient setting.  Please call Monday morning to schedule a follow-up appointment for sometime this week.  Today you received medications that may make you sleepy or impair your ability to make decisions.  For the next 24 hours please do not drive, operate heavy machinery, care for a small child with out another adult present, or perform any activities that may cause harm to you or someone else if you were to fall asleep or be impaired.   You are being prescribed a medication which may make you sleepy. Please follow up of listed precautions for at least 24 hours after taking one dose.   Get help right away if: The rash is on your face or nose. You have facial pain, pain around your eye area, or loss of feeling on one side of your face. You have difficulty seeing. You have ear pain or have ringing in your ear. You have a loss of taste. Your condition gets worse.

## 2021-06-20 NOTE — ED Notes (Signed)
Vision grossly intact, does report intermittent blurriness right eye, able to blink and clear vision

## 2021-06-22 ENCOUNTER — Telehealth: Payer: Self-pay | Admitting: *Deleted

## 2021-06-22 NOTE — Telephone Encounter (Signed)
  Follow up Call-  Call back number 06/18/2021 01/11/2020  Post procedure Call Back phone  # [phone]  Permission to leave phone message No No  Some recent data might be hidden     Patient questions:  Do you have a fever, pain , or abdominal swelling? No. Pain Score  0 *  Have you tolerated food without any problems? Yes.    Have you been able to return to your normal activities? Yes.    Do you have any questions about your discharge instructions: Diet   No. Medications  No. Follow up visit  No.  Do you have questions or concerns about your Care? No.  Actions: * If pain score is 4 or above: No action needed, pain <4.  Pt. Stated " I am fine from the procedure but I just found out I have shingles".   Have you developed a fever since your procedure? no  2.   Have you had an respiratory symptoms (SOB or cough) since your procedure? no  3.   Have you tested positive for COVID 19 since your procedure no  4.   Have you had any family members/close contacts diagnosed with the COVID 19 since your procedure?  no   If yes to any of these questions please route to Joylene John, RN and Joella Prince, RN

## 2021-06-24 ENCOUNTER — Encounter: Payer: Self-pay | Admitting: Internal Medicine

## 2021-06-25 ENCOUNTER — Encounter: Payer: Self-pay | Admitting: Internal Medicine

## 2021-06-25 MED ORDER — HYDROCODONE-ACETAMINOPHEN 5-325 MG PO TABS: 1.0000 | ORAL_TABLET | Freq: Four times a day (QID) | ORAL | 0 refills | Status: DC | PRN

## 2021-06-26 MED ORDER — GABAPENTIN 100 MG PO CAPS: 100.0000 mg | ORAL_CAPSULE | Freq: Three times a day (TID) | ORAL | 0 refills | Status: DC

## 2021-06-26 NOTE — Addendum Note (Signed)
Addended by: Biagio Borg on: 06/26/2021 01:12 PM   Modules accepted: Orders

## 2021-07-02 ENCOUNTER — Encounter: Payer: Self-pay | Admitting: Internal Medicine

## 2021-07-02 ENCOUNTER — Ambulatory Visit: Payer: Managed Care, Other (non HMO) | Admitting: Internal Medicine

## 2021-07-02 ENCOUNTER — Other Ambulatory Visit: Payer: Self-pay

## 2021-07-02 VITALS — BP 120/80 | HR 73 | Temp 97.5°F | Ht 65.0 in | Wt 170.0 lb

## 2021-07-02 DIAGNOSIS — L299 Pruritus, unspecified: Secondary | ICD-10-CM | POA: Diagnosis not present

## 2021-07-02 DIAGNOSIS — E559 Vitamin D deficiency, unspecified: Secondary | ICD-10-CM | POA: Diagnosis not present

## 2021-07-02 DIAGNOSIS — B0229 Other postherpetic nervous system involvement: Secondary | ICD-10-CM

## 2021-07-02 MED ORDER — TRIAMCINOLONE ACETONIDE 0.1 % EX CREA: 1.0000 "application " | TOPICAL_CREAM | Freq: Two times a day (BID) | CUTANEOUS | 0 refills | Status: DC

## 2021-07-02 NOTE — Progress Notes (Signed)
Patient ID: [name], female   DOB: 06-19-1966, 55 y.o.   MRN: [id number]        Chief Complaint: follow up PHN, itching, low vit d       HPI:  [name] is a 55 y.o. female here with recent UC visit 2 wks ago with acute shingles rash to the right periorbital and right temporal area, now much improved  but with pain persistent mild, now on gabapentin 100 tid and working well, asks to continue this.  Does have an itchy lesion to the mid right eyebrown for some reason incidentally starting at same time.  Also has some concern for shingles involvement to the right eye but not on specific med and has f/u appt with Dr Marliss Czar soon.  Not taking Vit d.  Pt denies chest pain, increased sob or doe, wheezing, orthopnea, PND, increased LE swelling, palpitations, dizziness or syncope.   Pt denies polydipsia, polyuria, or new focal neuro s/s.   Pt denies fever, wt loss, night sweats, loss of appetite, or other constitutional symptoms           Wt Readings from Last 3 Encounters:  07/02/21 170 lb (77.1 kg)  06/20/21 169 lb (76.7 kg)  06/18/21 169 lb (76.7 kg)   BP Readings from Last 3 Encounters:  07/02/21 120/80  06/20/21 122/64  06/18/21 108/66         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   DIZZINESS 08/15/2008   DYSPNEA 05/20/2008   FATIGUE 05/20/2008   History of 2019 novel coronavirus disease (COVID-19) 05/23/2019   History of hiatal hernia    HSV infection    KNEE PAIN, LEFT 09/11/2007   Left sided sciatica 04/19/2011   Lumbar herniated disc    Lynch syndrome    MIGRAINE HEADACHE 04/09/2007    Ovarian cyst    Palpitations 05/20/2008   Pancreatic lesion    PONV (postoperative nausea and vomiting)    PREMATURE VENTRICULAR CONTRACTIONS 08/23/2008   RASH-NONVESICULAR 01/17/2008   RUQ PAIN 02/23/2010   SICKLE CELL TRAIT 04/09/2007   Tension headache 04/09/2007   VARICOSE VEINS LOWER EXTREMITIES W/OTH COMPS 09/11/2007   Wears glasses    Past Surgical History:  Procedure Laterality Date   CESAREAN SECTION  2004   COLONOSCOPY  2016   FINGER FRACTURE SURGERY Left 03/2019   LAPAROSCOPIC ASSISTED VAGINAL HYSTERECTOMY N/A 10/23/2012   Procedure: LAPAROSCOPIC ASSISTED VAGINAL HYSTERECTOMY;  Surgeon: Margarette Asal, MD;  Location: [facility];  Service: Gynecology;  Laterality: N/A;   TONSILLECTOMY  07/18/2009   TUBAL LIGATION  01/06/2012   with cyst removal   UPPER GASTROINTESTINAL ENDOSCOPY      reports that she has never smoked. She has never used smokeless tobacco. She reports current alcohol use. She reports that she does not use drugs. family history includes Brain cancer in her maternal aunt; Cancer in her cousin and mother; Heart attack in her father; Heart disease in her mother; Hypertension in her mother; Liver cancer in her maternal aunt; Lung cancer in her maternal aunt; Pancreatitis in her father. Allergies  Allergen Reactions   Morphine And Related Rash   Current Outpatient Medications on File Prior to Visit  Medication Sig Dispense Refill   Estradiol 10 MCG TABS vaginal tablet      gabapentin (NEURONTIN) 100 MG capsule Take 1 capsule (100 mg total) by mouth 3 (three) times daily. 90 capsule 0   HYDROcodone-acetaminophen (NORCO/VICODIN) 5-325 MG tablet Take 1 tablet by mouth every 6 (six) hours as needed. 30 tablet 0   PARoxetine (PAXIL) 10 MG tablet Take 10 mg by mouth daily.     zolpidem (AMBIEN) 10 MG tablet Take 1 tablet (10 mg total) by mouth at bedtime as needed for sleep. 90 tablet 1   No current facility-administered medications on file prior to visit.         ROS:  All others reviewed and negative.  Objective        PE:  BP 120/80 (BP Location: Right Arm, Patient Position: Sitting, Cuff Size: Normal)    Pulse 73    Temp (!) 97.5 F (36.4 C) (Oral)    Ht 5\' 5"  (1.651 m)    Wt 170 lb (77.1 kg)    LMP 10/06/2012    SpO2 100%    BMI 28.29 kg/m                 Constitutional: Pt appears in NAD               HENT: Head: NCAT.                Right Ear: External ear normal.                 Left Ear: External ear normal.                Eyes: . Pupils are equal, round, and reactive to light. Conjunctivae and EOM are normal               Nose: without d/c or deformity               Neck: Neck supple. Gross normal ROM               Cardiovascular: Normal rate and regular rhythm.                 Pulmonary/Chest: Effort normal and breath sounds without rales or wheezing.                Abd:  Soft, NT, ND, + BS, no organomegaly               Neurological: Pt is alert. At baseline orientation, motor grossly intact               Skin: Skin is warm. Right mid eyebrow lesion with scaly 10 mm lesion, LE edema - none               Psychiatric: Pt behavior is normal without agitation   Micro: none  Cardiac tracings I have personally interpreted today:  none  Pertinent Radiological findings (summarize): none   Lab Results  Component Value Date   WBC 5.7 05/05/2021   HGB 14.4 05/05/2021   HCT 43.3 05/05/2021   PLT 236.0 05/05/2021   GLUCOSE 73 05/05/2021   CHOL 182 05/05/2021   TRIG 52.0 05/05/2021   HDL 87.00 05/05/2021   LDLCALC 84 05/05/2021   ALT 19 05/05/2021   AST 22 05/05/2021   NA 141 05/05/2021   K 4.2 05/05/2021  CL 104 05/05/2021   CREATININE 1.03 05/05/2021   BUN 15 05/05/2021   CO2 27 05/05/2021   TSH 0.93 05/05/2021   INR 1.0 03/01/2007   HGBA1C 5.4 05/05/2021   Assessment/Plan:  [name] is a 55 y.o. Black or African American [2] female Black or African American [2] female with  has a past medical history of ACUTE NASOPHARYNGITIS (05/07/2010), Allergic  rhinitis, cause unspecified (04/19/2011), Allergy, Anemia, Anxiety, BACK PAIN (02/05/2010), CELLULITIS (01/13/2010), CHEST PAIN UNSPECIFIED (08/23/2008), CONSTIPATION, CHRONIC (04/09/2007), DEEP VENOUS THROMBOPHLEBITIS, LEFT, LEG, HX OF (04/09/2007), DEGENERATIVE DISC DISEASE, LUMBAR SPINE (05/12/2007), DIZZINESS (08/15/2008), DYSPNEA (05/20/2008), FATIGUE (05/20/2008), History of 2019 novel coronavirus disease (COVID-19) (05/23/2019), History of hiatal hernia, HSV infection, KNEE PAIN, LEFT (09/11/2007), Left sided sciatica (04/19/2011), Lumbar herniated disc, Lynch syndrome, MIGRAINE HEADACHE (04/09/2007), Ovarian cyst, Palpitations (05/20/2008), Pancreatic lesion, PONV (postoperative nausea and vomiting), PREMATURE VENTRICULAR CONTRACTIONS (08/23/2008), RASH-NONVESICULAR (01/17/2008), RUQ PAIN (02/23/2010), SICKLE CELL TRAIT (04/09/2007), Tension headache (04/09/2007), VARICOSE VEINS LOWER EXTREMITIES W/OTH COMPS (09/11/2007), and Wears glasses.  Vitamin D deficiency Last vitamin D Lab Results  Component Value Date   VD25OH 34.46 05/05/2021   Low, to start oral replacement   PHN (postherpetic neuralgia) Uncontrolled, to restart gabapentin 100 tid,  to f/u any worsening symptoms or concerns  Itching To scally lesion right mid eyebrow, etiology unclear, for trial triam cr prn, consider derm if not improved  Followup: Return if symptoms worsen or fail to improve.  Cathlean Cower, MD 07/05/2021 9:00 PM [facility]

## 2021-07-02 NOTE — Patient Instructions (Signed)
Please take all new medication as prescribed - the steroid cream to the itchy areas  Please continue all other medications as before, including the valacyclovir and the gabapentin  Please take OTC Vitamin D3 at 2000 units per day, indefinitely  Please have the pharmacy call with any other refills you may need.  Please continue your efforts at being more active, low cholesterol diet, and weight control  Please keep your appointments with your specialists as you may have planned

## 2021-07-05 ENCOUNTER — Encounter: Payer: Self-pay | Admitting: Internal Medicine

## 2021-07-05 DIAGNOSIS — B0229 Other postherpetic nervous system involvement: Secondary | ICD-10-CM | POA: Insufficient documentation

## 2021-07-05 DIAGNOSIS — L299 Pruritus, unspecified: Secondary | ICD-10-CM | POA: Insufficient documentation

## 2021-07-05 NOTE — Assessment & Plan Note (Signed)
Uncontrolled, to restart gabapentin 100 tid,  to f/u any worsening symptoms or concerns

## 2021-07-05 NOTE — Assessment & Plan Note (Signed)
To scally lesion right mid eyebrow, etiology unclear, for trial triam cr prn, consider derm if not improved

## 2021-07-05 NOTE — Assessment & Plan Note (Signed)
Last vitamin D Lab Results  Component Value Date   VD25OH 34.46 05/05/2021   Low, to start oral replacement  

## 2021-09-25 ENCOUNTER — Encounter: Payer: Self-pay | Admitting: *Deleted

## 2021-09-29 ENCOUNTER — Ambulatory Visit: Payer: Managed Care, Other (non HMO) | Admitting: Internal Medicine

## 2021-12-03 DIAGNOSIS — L818 Other specified disorders of pigmentation: Secondary | ICD-10-CM | POA: Insufficient documentation

## 2021-12-03 DIAGNOSIS — L249 Irritant contact dermatitis, unspecified cause: Secondary | ICD-10-CM | POA: Insufficient documentation

## 2021-12-03 DIAGNOSIS — D485 Neoplasm of uncertain behavior of skin: Secondary | ICD-10-CM | POA: Insufficient documentation

## 2022-02-04 ENCOUNTER — Ambulatory Visit: Payer: Managed Care, Other (non HMO) | Admitting: Emergency Medicine

## 2022-02-04 ENCOUNTER — Encounter: Payer: Self-pay | Admitting: Emergency Medicine

## 2022-02-04 VITALS — BP 136/88 | HR 71 | Temp 97.6°F | Ht 65.0 in | Wt 178.0 lb

## 2022-02-04 DIAGNOSIS — B349 Viral infection, unspecified: Secondary | ICD-10-CM | POA: Diagnosis not present

## 2022-02-04 LAB — CBC WITH DIFFERENTIAL/PLATELET
Basophils Absolute: 0 10*3/uL (ref 0.0–0.1)
Basophils Relative: 1 % (ref 0.0–3.0)
Eosinophils Absolute: 0.1 10*3/uL (ref 0.0–0.7)
Eosinophils Relative: 2.9 % (ref 0.0–5.0)
HCT: 45.6 % (ref 36.0–46.0)
Hemoglobin: 15 g/dL (ref 12.0–15.0)
Lymphocytes Relative: 30 % (ref 12.0–46.0)
Lymphs Abs: 1.5 10*3/uL (ref 0.7–4.0)
MCHC: 32.9 g/dL (ref 30.0–36.0)
MCV: 84.8 fl (ref 78.0–100.0)
Monocytes Absolute: 0.2 10*3/uL (ref 0.1–1.0)
Monocytes Relative: 4.7 % (ref 3.0–12.0)
Neutro Abs: 3 10*3/uL (ref 1.4–7.7)
Neutrophils Relative %: 61.4 % (ref 43.0–77.0)
Platelets: 185 10*3/uL (ref 150.0–400.0)
RBC: 5.38 Mil/uL — ABNORMAL HIGH (ref 3.87–5.11)
RDW: 13.6 % (ref 11.5–15.5)
WBC: 4.9 10*3/uL (ref 4.0–10.5)

## 2022-02-04 LAB — COMPREHENSIVE METABOLIC PANEL
ALT: 15 U/L (ref 0–35)
AST: 21 U/L (ref 0–37)
Albumin: 4.8 g/dL (ref 3.5–5.2)
Alkaline Phosphatase: 64 U/L (ref 39–117)
BUN: 12 mg/dL (ref 6–23)
CO2: 27 mEq/L (ref 19–32)
Calcium: 9.9 mg/dL (ref 8.4–10.5)
Chloride: 102 mEq/L (ref 96–112)
Creatinine, Ser: 0.98 mg/dL (ref 0.40–1.20)
GFR: 65.54 mL/min (ref >60.00)
Glucose, Bld: 76 mg/dL (ref 70–99)
Potassium: 3.4 mEq/L — ABNORMAL LOW (ref 3.5–5.1)
Sodium: 140 mEq/L (ref 135–145)
Total Bilirubin: 0.5 mg/dL (ref 0.2–1.2)
Total Protein: 7.8 g/dL (ref 6.0–8.3)

## 2022-02-04 LAB — TSH: TSH: 0.7 u[IU]/mL (ref 0.35–5.50)

## 2022-02-04 LAB — LIPID PANEL
Cholesterol: 176 mg/dL (ref 0–200)
HDL: 75.3 mg/dL (ref >39.00)
LDL Cholesterol: 90 mg/dL (ref 0–99)
NonHDL: 100.21
Total CHOL/HDL Ratio: 2
Triglycerides: 50 mg/dL (ref 0.0–149.0)
VLDL: 10 mg/dL (ref 0.0–40.0)

## 2022-02-04 NOTE — Progress Notes (Signed)
[name] 56 y.o.   Chief Complaint  Patient presents with   Dizziness   Nausea   Fatigue    HISTORY OF PRESENT ILLNESS: Acute problem visit today.  Patient of Dr. Cathlean Cower This is a 56 y.o. female complaining of 3-day history of feeling dizzy, nauseous, prior with intermittent headaches.  No other associated symptoms. No new medications.  Non-smoker. No flulike symptoms.  No one else close to her sick. No other complaints or medical concerns today.  Dizziness Associated symptoms include abdominal pain, headaches and nausea. Pertinent negatives include no chest pain, chills, congestion, coughing, fever, myalgias, rash, sore throat or vomiting.     Prior to Admission medications   Medication Sig Start Date End Date Taking? Authorizing Provider  PARoxetine (PAXIL) 10 MG tablet Take 10 mg by mouth daily.   Yes [provider]  Estradiol 10 MCG TABS vaginal tablet     [provider]  gabapentin (NEURONTIN) 100 MG capsule Take 1 capsule (100 mg total) by mouth 3 (three) times daily. Patient not taking: Reported on 02/04/2022 06/26/21   Biagio Borg, MD  HYDROcodone-acetaminophen (NORCO/VICODIN) 5-325 MG tablet Take 1 tablet by mouth every 6 (six) hours as needed. Patient not taking: Reported on 02/04/2022 06/25/21   Biagio Borg, MD  triamcinolone cream (KENALOG) 0.1 % Apply 1 application topically 2 (two) times daily. Patient not taking: Reported on 02/04/2022 07/02/21 07/02/22  Biagio Borg, MD  zolpidem (AMBIEN) 10 MG tablet Take 1 tablet (10 mg total) by mouth at bedtime as needed for sleep. 10/09/20 07/02/21  Biagio Borg, MD    Allergies  Allergen Reactions   Morphine And Related Rash    Patient Active Problem List   Diagnosis Date Noted   PHN (postherpetic neuralgia) 07/05/2021   Itching 07/05/2021   Eustachian tube dysfunction, bilateral 05/10/2021   Abdominal pain 05/10/2021   Vitamin D deficiency 05/10/2021   Lynch syndrome 05/01/2021    Acute pharyngitis 03/22/2021   Cough 12/02/2020   Insomnia 12/02/2020   Arthralgia of right elbow 09/20/2018   Venous (peripheral) insufficiency 12/18/2017   Nausea without vomiting 05/27/2014   Pelvic pain 12/27/2013   Adenomyosis 10/24/2012   Facial weakness 10/04/2012   Tongue fasciculation 10/04/2012   Tremor 10/04/2011   Left facial pain 01/13/2011   Encounter for well adult exam with abnormal findings 01/13/2011   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   BACK PAIN 02/05/2010   PREMATURE VENTRICULAR CONTRACTIONS 08/23/2008   Chest pain 08/23/2008   DIZZINESS 08/15/2008   FATIGUE 05/20/2008   Palpitations 05/20/2008   TONSILLITIS, CHRONIC 03/13/2008   KNEE PAIN, LEFT 09/11/2007   DEGENERATIVE DISC DISEASE, 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   SICKLE CELL TRAIT 04/09/2007   MIGRAINE HEADACHE 04/09/2007   DEEP VENOUS THROMBOPHLEBITIS, LEFT, LEG, HX OF 04/09/2007    Past Medical History:  Diagnosis Date   ACUTE NASOPHARYNGITIS 05/07/2010   Allergic rhinitis, cause unspecified 04/19/2011   Allergy    seasonal   Anemia    History of anemia   Anxiety    BACK PAIN 02/05/2010   CELLULITIS 01/13/2010   CHEST PAIN UNSPECIFIED 08/23/2008   CONSTIPATION, CHRONIC 04/09/2007   DEEP VENOUS THROMBOPHLEBITIS, LEFT, LEG, HX OF 04/09/2007   DEGENERATIVE [place] DISEASE, 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   DIZZINESS 08/15/2008   DYSPNEA 05/20/2008   FATIGUE 05/20/2008   History of 2019 novel coronavirus disease (COVID-19) 05/23/2019   History of hiatal hernia    HSV infection    Hyperplastic colon  polyp    KNEE PAIN, LEFT 09/11/2007   Left sided sciatica 04/19/2011   Lumbar herniated disc    Lynch syndrome    MIGRAINE HEADACHE 04/09/2007   Ovarian cyst    Palpitations 05/20/2008   Pancreatic lesion    PONV (postoperative nausea and vomiting)    PREMATURE VENTRICULAR CONTRACTIONS 08/23/2008   RASH-NONVESICULAR 01/17/2008   RUQ PAIN 02/23/2010   SICKLE CELL TRAIT 04/09/2007   Tension headache  04/09/2007   VARICOSE VEINS LOWER EXTREMITIES W/OTH COMPS 09/11/2007   Wears glasses     Past Surgical History:  Procedure Laterality Date   CESAREAN SECTION  2004   COLONOSCOPY  2016   FINGER FRACTURE SURGERY Left 03/2019   LAPAROSCOPIC ASSISTED VAGINAL HYSTERECTOMY N/A 10/23/2012   Procedure: LAPAROSCOPIC ASSISTED VAGINAL HYSTERECTOMY;  Surgeon: Margarette Asal, MD;  Location: [facility];  Service: Gynecology;  Laterality: N/A;   TONSILLECTOMY  07/18/2009   TUBAL LIGATION  01/06/2012   with cyst removal   UPPER GASTROINTESTINAL ENDOSCOPY      Social History   Socioeconomic History   Marital status: Married    Spouse name: Not on file   Number of children: 3   Years of education: Not on file   Highest education level: Not on file  Occupational History   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     Employer: [employer]   Occupation: currently does not work outside the home (12/06/19)  Tobacco Use   Smoking status: Never   Smokeless tobacco: Never  Vaping Use   Vaping Use: Never used  Substance and Sexual Activity   Alcohol use: Yes    Comment: socially rare every 1-2 months   Drug use: No   Sexual activity: Not on file  Other Topics Concern   Not on file  Social History Narrative   Not on file   Social Determinants of Health   Financial Resource Strain: Not on file  Food Insecurity: Not on file  Transportation Needs: Not on file  Physical Activity: Not on file  Stress: Not on file  Social Connections: Not on file  Intimate Partner Violence: Not on file    Family History  Problem Relation Age of Onset   Heart disease Mother    Hypertension Mother    Cancer Mother        ovarian    Heart attack Father    Pancreatitis Father    Liver cancer Maternal Aunt    Brain cancer Maternal Aunt    Lung cancer Maternal Aunt    Cancer Cousin        breast   Colon cancer Neg Hx    Colon polyps Neg Hx    Esophageal cancer Neg Hx    Rectal cancer Neg Hx     Stomach cancer Neg Hx      Review of Systems  Constitutional:  Positive for malaise/fatigue. Negative for chills, fever and weight loss.  HENT: Negative.  Negative for congestion and sore throat.   Eyes: Negative.   Respiratory: Negative.  Negative for cough and shortness of breath.   Cardiovascular:  Negative for chest pain and palpitations.  Gastrointestinal:  Positive for abdominal pain and nausea. Negative for blood in stool, melena and vomiting.  Genitourinary: Negative.  Negative for dysuria and hematuria.  Musculoskeletal: Negative.  Negative for myalgias.  Skin: Negative.  Negative for rash.  Neurological:  Positive for dizziness and headaches.  All other systems reviewed and are negative.   Today's Vitals  02/04/22 1528  BP: 136/88  Pulse: 71  Temp: 97.6 F (36.4 C)  TempSrc: Oral  SpO2: 94%  Weight: 178 lb (80.7 kg)  Height: '5\' 5"'$  (1.651 m)   Body mass index is 29.62 kg/m. Wt Readings from Last 3 Encounters:  02/04/22 178 lb (80.7 kg)  07/02/21 170 lb (77.1 kg)  06/20/21 169 lb (76.7 kg)    Physical Exam Vitals reviewed.  Constitutional:      Appearance: Normal appearance.  HENT:     Head: Normocephalic.     Right Ear: Tympanic membrane, ear canal and external ear normal.     Left Ear: Tympanic membrane, ear canal and external ear normal.     Mouth/Throat:     Mouth: Mucous membranes are moist.     Pharynx: Oropharynx is clear.  Eyes:     Extraocular Movements: Extraocular movements intact.     Conjunctiva/sclera: Conjunctivae normal.     Pupils: Pupils are equal, round, and reactive to light.  Cardiovascular:     Rate and Rhythm: Normal rate and regular rhythm.     Pulses: Normal pulses.     Heart sounds: Normal heart sounds.  Pulmonary:     Effort: Pulmonary effort is normal.     Breath sounds: Normal breath sounds.  Abdominal:     Palpations: Abdomen is soft.     Tenderness: There is abdominal tenderness (Mild tenderness to right upper  quadrant).  Musculoskeletal:     Cervical back: No tenderness.     Right lower leg: No edema.     Left lower leg: No edema.  Lymphadenopathy:     Cervical: No cervical adenopathy.  Skin:    General: Skin is warm and dry.     Capillary Refill: Capillary refill takes less than 2 seconds.  Neurological:     General: No focal deficit present.     Mental Status: She is alert and oriented to person, place, and time.  Psychiatric:        Mood and Affect: Mood normal.        Behavior: Behavior normal.      ASSESSMENT & PLAN: A total of 45 minutes was spent with the patient and counseling/coordination of care regarding preparing for this visit, review of most recent office visit notes and available medical records, review of most recent blood work results, review of all medications, differential diagnosis of nonspecific symptoms, need for diagnostic work-up, need for blood work, prognosis, documentation, and need for follow-up.  Problem List Items Addressed This Visit       Other   Viral illness - Primary    Multiple nonspecific symptoms compatible with viral illness most likely. No red flag signs or symptoms.  Clinically stable. Need for diagnostic work-up including urinalysis and blood work. Advised to rest and stay well-hydrated. Advised to contact the office if no better or worse during the next several days.      Relevant Orders   Urinalysis   CBC with Differential/Platelet   Comprehensive metabolic panel   Hemoglobin A1c   Lipid panel   TSH   Patient Instructions  Viral Illness, Adult Viruses are tiny germs that can get into a person's body and cause illness. There are many different types of viruses, and they cause many types of illness. Viral illnesses can range from mild to severe. They can affect various parts of the body. Short-term conditions that are caused by a virus include colds and the flu (influenza). Long-term conditions that are caused  by a virus include  herpes, shingles, and HIV (human immunodeficiency virus) infection. A few viruses have been linked to certain cancers. What are the causes? Many types of viruses can cause illness. Viruses invade cells in your body, multiply, and cause the infected cells to work abnormally or die. When these cells die, they release more of the virus. When this happens, you develop symptoms of the illness, and the virus continues to spread to other cells. If the virus takes over the function of the cell, it can cause the cell to divide and grow out of control. This happens when a virus causes cancer. Different viruses get into the body in different ways. You can get a virus by: Swallowing food or water that has come in contact with the virus (is contaminated). Breathing in droplets that have been coughed or sneezed into the air by an infected person. Touching a surface that has been contaminated with the virus and then touching your eyes, nose, or mouth. Being bitten by an insect or animal that carries the virus. Having sexual contact with a person who is infected with the virus. Being exposed to blood or fluids that contain the virus, either through an open cut or during a transfusion. If a virus enters your body, your body's defense system (immune system) will try to fight the virus. You may be at higher risk for a viral illness if your immune system is weak. What are the signs or symptoms? You may have these symptoms, depending on the type of virus and the location of the cells that it invades: Cold and flu viruses: Fever. Headache. Sore throat. Muscle aches. Stuffy nose (nasal congestion). Cough. Digestive system (gastrointestinal) viruses: Fever. Pain in the abdomen. Nausea. Diarrhea. Liver viruses (hepatitis): Loss of appetite. Tiredness. Skin or the white parts of your eyes turning yellow (jaundice). Brain and spinal cord viruses: Fever. Headache. Stiff neck. Nausea and vomiting. Confusion or  sleepiness. Skin viruses: Warts. Itching. Rash. Sexually transmitted viruses: Discharge. Swelling. Redness. Rash. How is this diagnosed? This condition may be diagnosed based on one or more of the following: Symptoms. Medical history. Physical exam. Blood test, sample of mucus from your lungs (sputum sample), stool sample, or a swab of body fluids or a skin sore (lesion). How is this treated? Viruses can be hard to treat because they live within cells. Antibiotic medicines do not treat viruses because these medicines do not get inside cells. Treatment for a viral illness may include: Resting and drinking plenty of fluids. Medicines to relieve symptoms. These can include over-the-counter medicine for pain and fever, medicines for cough or congestion, and medicines to relieve diarrhea. Antiviral medicines. These medicines are available only for certain types of viruses. Some viral illnesses can be prevented with vaccinations. A common example is the flu shot. Follow these instructions at home: Medicines Take over-the-counter and prescription medicines only as told by your health care provider. If you were prescribed an antiviral medicine, take it as told by your health care provider. Do not stop taking the antiviral even if you start to feel better. Be aware of when antibiotics are needed and when they are not needed. Antibiotics do not treat viruses. You may get an antibiotic if your health care provider thinks that you may have, or are at risk for, a bacterial infection and you have a viral infection. Do not ask for an antibiotic prescription if you have been diagnosed with a viral illness. Antibiotics will not make your illness go away  faster. Frequently taking antibiotics when they are not needed can lead to antibiotic resistance. When this develops, the medicine no longer works against the bacteria that it normally fights. General instructions  Drink enough fluids to keep your urine  pale yellow. Rest as much as possible. Return to your normal activities as told by your health care provider. Ask your health care provider what activities are safe for you. Keep all follow-up visits as told by your health care provider. This is important. How is this prevented? To reduce your risk of viral illness: Wash your hands often with soap and water for at least 20 seconds. If soap and water are not available, use hand sanitizer. Avoid touching your nose, eyes, and mouth, especially if you have not washed your hands recently. If anyone in your household has a viral infection, clean all household surfaces that may have been in contact with the virus. Use soap and hot water. You may also use bleach that you have added water to (diluted). Stay away from people who are sick with symptoms of a viral infection. Do not share items such as toothbrushes and water bottles with other people. Keep your vaccinations up to date. This includes getting a yearly flu shot. Eat a healthy diet and get plenty of rest. Contact a health care provider if: You have symptoms of a viral illness that do not go away. Your symptoms come back after going away. Your symptoms get worse. Get help right away if you have: Trouble breathing. A severe headache or a stiff neck. Severe vomiting or pain in your abdomen. These symptoms may represent a serious problem that is an emergency. Do not wait to see if the symptoms will go away. Get medical help right away. Call your local emergency services (911 in the [place] Do not drive yourself to the hospital. Summary Viruses are types of germs that can get into a person's body and cause illness. Viral illnesses can range from mild to severe. They can affect various parts of the body. Viruses can be hard to treat. There are medicines to relieve symptoms, and there are some antiviral medicines. If you were prescribed an antiviral medicine, take it as told by your health care  provider. Do not stop taking the antiviral even if you start to feel better. Contact a health care provider if you have symptoms of a viral illness that do not go away. This information is not intended to replace advice given to you by your health care provider. Make sure you discuss any questions you have with your health care provider. Document Revised: 11/19/2019 Document Reviewed: 05/15/2019 Elsevier Patient Education  [place], MD [facility]

## 2022-02-04 NOTE — Patient Instructions (Signed)

## 2022-02-04 NOTE — Assessment & Plan Note (Signed)
Multiple nonspecific symptoms compatible with viral illness most likely. No red flag signs or symptoms.  Clinically stable. Need for diagnostic work-up including urinalysis and blood work. Advised to rest and stay well-hydrated. Advised to contact the office if no better or worse during the next several days.

## 2022-02-05 ENCOUNTER — Encounter: Payer: Self-pay | Admitting: Internal Medicine

## 2022-02-05 LAB — URINALYSIS
Bilirubin Urine: NEGATIVE
Hgb urine dipstick: NEGATIVE
Leukocytes,Ua: NEGATIVE
Nitrite: NEGATIVE
Specific Gravity, Urine: 1.01 (ref 1.000–1.030)
Total Protein, Urine: NEGATIVE
Urine Glucose: NEGATIVE
Urobilinogen, UA: 0.2 (ref 0.0–1.0)
pH: 6 (ref 5.0–8.0)

## 2022-02-05 LAB — HEMOGLOBIN A1C: Hgb A1c MFr Bld: 5.6 % (ref 4.6–6.5)

## 2022-05-10 ENCOUNTER — Encounter: Payer: Managed Care, Other (non HMO) | Admitting: Internal Medicine

## 2022-05-24 ENCOUNTER — Ambulatory Visit: Payer: Managed Care, Other (non HMO) | Admitting: Physician Assistant

## 2022-06-16 ENCOUNTER — Ambulatory Visit: Payer: Managed Care, Other (non HMO) | Admitting: Internal Medicine

## 2022-06-16 VITALS — BP 126/72 | HR 83 | Temp 98.0°F | Ht 65.0 in | Wt 191.0 lb

## 2022-06-16 DIAGNOSIS — M25511 Pain in right shoulder: Secondary | ICD-10-CM | POA: Diagnosis not present

## 2022-06-16 DIAGNOSIS — E7800 Pure hypercholesterolemia, unspecified: Secondary | ICD-10-CM | POA: Diagnosis not present

## 2022-06-16 DIAGNOSIS — M549 Dorsalgia, unspecified: Secondary | ICD-10-CM | POA: Insufficient documentation

## 2022-06-16 DIAGNOSIS — R739 Hyperglycemia, unspecified: Secondary | ICD-10-CM

## 2022-06-16 DIAGNOSIS — Z0001 Encounter for general adult medical examination with abnormal findings: Secondary | ICD-10-CM | POA: Insufficient documentation

## 2022-06-16 DIAGNOSIS — E538 Deficiency of other specified B group vitamins: Secondary | ICD-10-CM | POA: Diagnosis not present

## 2022-06-16 DIAGNOSIS — E559 Vitamin D deficiency, unspecified: Secondary | ICD-10-CM | POA: Diagnosis not present

## 2022-06-16 LAB — CBC WITH DIFFERENTIAL/PLATELET
Basophils Absolute: 0 10*3/uL (ref 0.0–0.1)
Basophils Relative: 0.9 % (ref 0.0–3.0)
Eosinophils Absolute: 0.2 10*3/uL (ref 0.0–0.7)
Eosinophils Relative: 6 % — ABNORMAL HIGH (ref 0.0–5.0)
HCT: 43.3 % (ref 36.0–46.0)
Hemoglobin: 14.5 g/dL (ref 12.0–15.0)
Lymphocytes Relative: 36.3 % (ref 12.0–46.0)
Lymphs Abs: 1.5 10*3/uL (ref 0.7–4.0)
MCHC: 33.5 g/dL (ref 30.0–36.0)
MCV: 84.7 fl (ref 78.0–100.0)
Monocytes Absolute: 0.3 10*3/uL (ref 0.1–1.0)
Monocytes Relative: 7.1 % (ref 3.0–12.0)
Neutro Abs: 2 10*3/uL (ref 1.4–7.7)
Neutrophils Relative %: 49.7 % (ref 43.0–77.0)
Platelets: 265 10*3/uL (ref 150.0–400.0)
RBC: 5.11 Mil/uL (ref 3.87–5.11)
RDW: 14 % (ref 11.5–15.5)
WBC: 4.1 10*3/uL (ref 4.0–10.5)

## 2022-06-16 LAB — URINALYSIS, ROUTINE W REFLEX MICROSCOPIC
Bilirubin Urine: NEGATIVE
Hgb urine dipstick: NEGATIVE
Ketones, ur: NEGATIVE
Leukocytes,Ua: NEGATIVE
Nitrite: NEGATIVE
Specific Gravity, Urine: 1.01 (ref 1.000–1.030)
Total Protein, Urine: NEGATIVE
Urine Glucose: NEGATIVE
Urobilinogen, UA: 0.2 (ref 0.0–1.0)
pH: 6.5 (ref 5.0–8.0)

## 2022-06-16 LAB — HEPATIC FUNCTION PANEL
ALT: 18 U/L (ref 0–35)
AST: 23 U/L (ref 0–37)
Albumin: 4.7 g/dL (ref 3.5–5.2)
Alkaline Phosphatase: 60 U/L (ref 39–117)
Bilirubin, Direct: 0.1 mg/dL (ref 0.0–0.3)
Total Bilirubin: 0.4 mg/dL (ref 0.2–1.2)
Total Protein: 7.8 g/dL (ref 6.0–8.3)

## 2022-06-16 LAB — LIPID PANEL
Cholesterol: 185 mg/dL (ref 0–200)
HDL: 78 mg/dL (ref >39.00)
LDL Cholesterol: 99 mg/dL (ref 0–99)
NonHDL: 106.6
Total CHOL/HDL Ratio: 2
Triglycerides: 40 mg/dL (ref 0.0–149.0)
VLDL: 8 mg/dL (ref 0.0–40.0)

## 2022-06-16 LAB — BASIC METABOLIC PANEL
BUN: 17 mg/dL (ref 6–23)
CO2: 30 mEq/L (ref 19–32)
Calcium: 10.1 mg/dL (ref 8.4–10.5)
Chloride: 102 mEq/L (ref 96–112)
Creatinine, Ser: 1.07 mg/dL (ref 0.40–1.20)
GFR: 58.83 mL/min — ABNORMAL LOW (ref >60.00)
Glucose, Bld: 76 mg/dL (ref 70–99)
Potassium: 3.9 mEq/L (ref 3.5–5.1)
Sodium: 139 mEq/L (ref 135–145)

## 2022-06-16 LAB — VITAMIN B12: Vitamin B-12: 267 pg/mL (ref 211–911)

## 2022-06-16 LAB — HEMOGLOBIN A1C: Hgb A1c MFr Bld: 6 % (ref 4.6–6.5)

## 2022-06-16 LAB — TSH: TSH: 0.78 u[IU]/mL (ref 0.35–5.50)

## 2022-06-16 LAB — VITAMIN D 25 HYDROXY (VIT D DEFICIENCY, FRACTURES): VITD: 44.43 ng/mL (ref 30.00–100.00)

## 2022-06-16 MED ORDER — CYCLOBENZAPRINE HCL 5 MG PO TABS: 5.0000 mg | ORAL_TABLET | Freq: Three times a day (TID) | ORAL | 1 refills | Status: DC | PRN

## 2022-06-16 NOTE — Progress Notes (Signed)
Patient ID: [name], female   DOB: [birth year], 56 y.o.   MRN: [id number]         Chief Complaint:: wellness exam and right shoulder and upper back pain, low vit d       HPI:  [name] is a 56 y.o. female here for wellness exam; declines colonoscopy for now, covid booster, flu shot, and plans for shingrx at the pharmacy, o/w up to date                        Also c/o 1-2 wks worsening right shoulder pain, mild to mod, constant, worse to abduct and cannot raise overhead.  Also has right upper back spasm tenderness at times as well, all seemed to start after helping to lift a heavy object with moving.  Pt denies chest pain, increased sob or doe, wheezing, orthopnea, PND, increased LE swelling, palpitations, dizziness or syncope.   Pt denies polydipsia, polyuria, or new focal neuro s/s.    Pt denies fever, wt loss, night sweats, loss of appetite, or other constitutional symptoms     Wt Readings from Last 3 Encounters:  06/16/22 191 lb (86.6 kg)  02/04/22 178 lb (80.7 kg)  07/02/21 170 lb (77.1 kg)   BP Readings from Last 3 Encounters:  06/16/22 126/72  02/04/22 136/88  07/02/21 120/80   Immunization History  Administered Date(s) Administered   Influenza Split 04/19/2011   Influenza Whole 04/22/2004   PFIZER(Purple Top)SARS-COV-2 Vaccination 10/25/2019, 11/26/2019   Td 08/15/2008   Tdap 02/02/2019   Unspecified SARS-COV-2 Vaccination 11/26/2019   Health Maintenance Due  Topic Date Due   COLONOSCOPY (Pts 45-28yr Insurance coverage will need to be confirmed)  06/18/2022      Past Medical History:  Diagnosis Date   ACUTE NASOPHARYNGITIS 05/07/2010   Allergic rhinitis, cause unspecified 04/19/2011   Allergy    seasonal   Anemia    History of anemia   Anxiety    BACK PAIN 02/05/2010   CELLULITIS 01/13/2010   CHEST PAIN UNSPECIFIED 08/23/2008   CONSTIPATION, CHRONIC 04/09/2007   DEEP VENOUS THROMBOPHLEBITIS, LEFT, LEG, HX OF 04/09/2007   DEGENERATIVE DISC DISEASE,  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   DIZZINESS 08/15/2008   DYSPNEA 05/20/2008   FATIGUE 05/20/2008   History of 2019 novel coronavirus disease (COVID-19) 05/23/2019   History of hiatal hernia    HSV infection    Hyperplastic colon polyp    KNEE PAIN, LEFT 09/11/2007   Left sided sciatica 04/19/2011   Lumbar herniated disc    Lynch syndrome    MIGRAINE HEADACHE 04/09/2007   Ovarian cyst    Palpitations 05/20/2008   Pancreatic lesion    PONV (postoperative nausea and vomiting)    PREMATURE VENTRICULAR CONTRACTIONS 08/23/2008   RASH-NONVESICULAR 01/17/2008   RUQ PAIN 02/23/2010   SICKLE CELL TRAIT 04/09/2007   Tension headache 04/09/2007   VARICOSE VEINS LOWER EXTREMITIES W/OTH COMPS 09/11/2007   Wears glasses    Past Surgical History:  Procedure Laterality Date   CESAREAN SECTION  2004   COLONOSCOPY  2016   FINGER FRACTURE SURGERY Left 03/2019   LAPAROSCOPIC ASSISTED VAGINAL HYSTERECTOMY N/A 10/23/2012   Procedure: LAPAROSCOPIC ASSISTED VAGINAL HYSTERECTOMY;  Surgeon: RMargarette Asal MD;  Location: [facility];  Service: Gynecology;  Laterality: N/A;   TONSILLECTOMY  07/18/2009   TUBAL LIGATION  01/06/2012   with cyst removal   UPPER GASTROINTESTINAL ENDOSCOPY      reports that she has never smoked.  She has never used smokeless tobacco. She reports current alcohol use. She reports that she does not use drugs. family history includes Brain cancer in her maternal aunt; Cancer in her cousin and mother; Heart attack in her father; Heart disease in her mother; Hypertension in her mother; Liver cancer in her maternal aunt; Lung cancer in her maternal aunt; Pancreatitis in her father. Allergies  Allergen Reactions   Morphine And Related Rash   No current outpatient medications on file prior to visit.   No current facility-administered medications on file prior to visit.        ROS:  All others reviewed and negative.  Objective        PE:  BP 126/72 (BP Location: Right Arm, Patient  Position: Sitting, Cuff Size: Large)   Pulse 83   Temp 98 F (36.7 C) (Oral)   Ht '5\' 5"'$  (1.651 m)   Wt 191 lb (86.6 kg)   LMP 10/05/2012   SpO2 97%   BMI 31.78 kg/m                 Constitutional: Pt appears in NAD               HENT: Head: NCAT.                Right Ear: External ear normal.                 Left Ear: External ear normal.                Eyes: . Pupils are equal, round, and reactive to light. Conjunctivae and EOM are normal               Nose: without d/c or deformity               Neck: Neck supple. Gross normal ROM               Cardiovascular: Normal rate and regular rhythm.                 Pulmonary/Chest: Effort normal and breath sounds without rales or wheezing.                Abd:  Soft, NT, ND, + BS, no organomegaly               Neurological: Pt is alert. At baseline orientation, motor grossly intact               Skin: Skin is warm. No rashes, no other new lesions, LE edema - none               Psychiatric: Pt behavior is normal without agitation   Micro: none  Cardiac tracings I have personally interpreted today:  none  Pertinent Radiological findings (summarize): none   Lab Results  Component Value Date   WBC 4.1 06/16/2022   HGB 14.5 06/16/2022   HCT 43.3 06/16/2022   PLT 265.0 06/16/2022   GLUCOSE 76 06/16/2022   CHOL 185 06/16/2022   TRIG 40.0 06/16/2022   HDL 78.00 06/16/2022   LDLCALC 99 06/16/2022   ALT 18 06/16/2022   AST 23 06/16/2022   NA 139 06/16/2022   K 3.9 06/16/2022   CL 102 06/16/2022   CREATININE 1.07 06/16/2022   BUN 17 06/16/2022   CO2 30 06/16/2022   TSH 0.78 06/16/2022   INR 1.0 03/01/2007   HGBA1C 6.0 06/16/2022   Assessment/Plan:  [name] is  a 56 y.o. Black or African American [2] female with  has a past medical history of ACUTE NASOPHARYNGITIS (05/07/2010), Allergic rhinitis, cause unspecified (04/19/2011), Allergy, Anemia, Anxiety, BACK PAIN (02/05/2010), CELLULITIS (01/13/2010), CHEST PAIN UNSPECIFIED  (08/23/2008), CONSTIPATION, CHRONIC (04/09/2007), DEEP VENOUS THROMBOPHLEBITIS, LEFT, LEG, HX OF (04/09/2007), DEGENERATIVE DISC DISEASE, LUMBAR SPINE (05/12/2007), DIZZINESS (08/15/2008), DYSPNEA (05/20/2008), FATIGUE (05/20/2008), History of 2019 novel coronavirus disease (COVID-19) (05/23/2019), History of hiatal hernia, HSV infection, Hyperplastic colon polyp, KNEE PAIN, LEFT (09/11/2007), Left sided sciatica (04/19/2011), Lumbar herniated disc, Lynch syndrome, MIGRAINE HEADACHE (04/09/2007), Ovarian cyst, Palpitations (05/20/2008), Pancreatic lesion, PONV (postoperative nausea and vomiting), PREMATURE VENTRICULAR CONTRACTIONS (08/23/2008), RASH-NONVESICULAR (01/17/2008), RUQ PAIN (02/23/2010), SICKLE CELL TRAIT (04/09/2007), Tension headache (04/09/2007), VARICOSE VEINS LOWER EXTREMITIES W/OTH COMPS (09/11/2007), and Wears glasses.  Vitamin D deficiency Last vitamin D Lab Results  Component Value Date   VD25OH 34.46 05/05/2021   Low, to start oral replacement   Upper back pain on right side C/w msk strain - for flexeril 5 tid prn  Right shoulder pain C/w probable rot cuff disorder, for ortho referral as may need procedure  Encounter for well adult exam with abnormal findings Age and sex appropriate education and counseling updated with regular exercise and diet Referrals for preventative services - decliens colonoscopy fo rnow Immunizations addressed - declines covid booster, fliu shot, may consider shingrx at pharmacy Smoking counseling  - none needed Evidence for depression or other mood disorder - none significant Most recent labs reviewed. I have personally reviewed and have noted: 1) the patient's medical and social history 2) The patient's current medications and supplements 3) The patient's height, weight, and BMI have been recorded in the chart  Followup: Return in about 1 year (around 06/17/2023).  Cathlean Cower, MD 06/19/2022 8:52 PM [facility]

## 2022-06-16 NOTE — Patient Instructions (Addendum)
Please have your Shingrix (shingles) shots done at your local pharmacy.  Please take all new medication as prescribed - the muscle relaxer as needed  You will be contacted regarding the referral for: Dr Veverly Fells -  [facility]  Please continue all other medications as before, and refills have been done if requested.  Please have the pharmacy call with any other refills you may need.  Please continue your efforts at being more active, low cholesterol diet, and weight control.  You are otherwise up to date with prevention measures today.  Please keep your appointments with your specialists as you may have planned  Please go to the LAB at the blood drawing area for the tests to be done  You will be contacted by phone if any changes need to be made immediately.  Otherwise, you will receive a letter about your results with an explanation, but please check with MyChart first.  Please remember to sign up for MyChart if you have not done so, as this will be important to you in the future with finding out test results, communicating by private email, and scheduling acute appointments online when needed.  Please make an Appointment to return for your 1 year visit, or sooner if needed

## 2022-06-16 NOTE — Assessment & Plan Note (Signed)
Last vitamin D Lab Results  Component Value Date   VD25OH 34.46 05/05/2021   Low, to start oral replacement

## 2022-06-19 ENCOUNTER — Encounter: Payer: Self-pay | Admitting: Internal Medicine

## 2022-06-19 NOTE — Assessment & Plan Note (Signed)
Age and sex appropriate education and counseling updated with regular exercise and diet Referrals for preventative services - decliens colonoscopy fo rnow Immunizations addressed - declines covid booster, fliu shot, may consider shingrx at pharmacy Smoking counseling  - none needed Evidence for depression or other mood disorder - none significant Most recent labs reviewed. I have personally reviewed and have noted: 1) the patient's medical and social history 2) The patient's current medications and supplements 3) The patient's height, weight, and BMI have been recorded in the chart

## 2022-06-19 NOTE — Assessment & Plan Note (Signed)
C/w probable rot cuff disorder, for ortho referral as may need procedure

## 2022-06-19 NOTE — Assessment & Plan Note (Signed)
C/w msk strain - for flexeril 5 tid prn

## 2022-06-21 ENCOUNTER — Encounter: Payer: Self-pay | Admitting: Internal Medicine

## 2022-06-21 NOTE — Telephone Encounter (Signed)
Those were just the codes we used to order the lab tests, so everything is paid for

## 2022-06-23 ENCOUNTER — Encounter: Payer: Self-pay | Admitting: Physician Assistant

## 2022-06-23 ENCOUNTER — Ambulatory Visit: Payer: Managed Care, Other (non HMO) | Admitting: Physician Assistant

## 2022-06-23 VITALS — BP 120/70 | HR 88 | Ht 65.0 in | Wt 188.4 lb

## 2022-06-23 DIAGNOSIS — R1011 Right upper quadrant pain: Secondary | ICD-10-CM

## 2022-06-23 DIAGNOSIS — R1013 Epigastric pain: Secondary | ICD-10-CM | POA: Diagnosis not present

## 2022-06-23 DIAGNOSIS — Z1509 Genetic susceptibility to other malignant neoplasm: Secondary | ICD-10-CM

## 2022-06-23 DIAGNOSIS — K219 Gastro-esophageal reflux disease without esophagitis: Secondary | ICD-10-CM | POA: Diagnosis not present

## 2022-06-23 DIAGNOSIS — R12 Heartburn: Secondary | ICD-10-CM

## 2022-06-23 MED ORDER — OMEPRAZOLE 40 MG PO CPDR: 40.0000 mg | DELAYED_RELEASE_CAPSULE | Freq: Every day | ORAL | 6 refills | Status: DC

## 2022-06-23 NOTE — Patient Instructions (Addendum)
If you are age 54 or older, your body mass index should be between 23-30. Your Body mass index is 31.35 kg/m. If this is out of the aforementioned range listed, please consider follow up with your Primary Care Provider.  If you are age 49 or younger, your body mass index should be between 19-25. Your Body mass index is 31.35 kg/m. If this is out of the aformentioned range listed, please consider follow up with your Primary Care Provider.   ________________________________________________________   [name] have been scheduled for an endoscopy and colonoscopy. Please follow the written instructions given to you at your visit today. Please pick up your prep supplies at the pharmacy within the next 1-3 days. If you use inhalers (even only as needed), please bring them with you on the day of your procedure.  You have been scheduled for an abdominal ultrasound at [facility] (1st floor of hospital) on Tuesday, 06-30-23 at 8:30am. Please arrive 30 minutes prior to your appointment for registration. Make certain not to have anything to eat or drink 6 hours prior to your appointment. Should you need to reschedule your appointment, please contact radiology at [phone]. This test typically takes about 30 minutes to perform.  We have sent the following medications to your pharmacy for you to pick up at your convenience: Omeprazole 40 mg: Take every morning before breakfast  We are giving you a reflux/GERD handout today.  Thank you for entrusting me with your care and for choosing [facility], Amy Genia Harold, P.A.- C.

## 2022-06-23 NOTE — Progress Notes (Signed)
Subjective:    Patient ID: [name], female    DOB: [birth year], 56 y.o.   MRN: [id number]  HPI [name] is a pleasant 56 year old African-American female, established with Dr. Hilarie [name].  She has history of Lynch syndrome, and comes in to discuss follow-up colonoscopy and also current issues with epigastric and right upper quadrant pain. She last underwent colonoscopy in December 2022, had removal of one 3 mm polyp in the descending colon which path proved to be a benign hyperplastic polyp. She had EGD in June 2021 with finding of a 3 cm hiatal hernia otherwise negative exam.  Gastric biopsies showed benign gastric mucosa, no H. pylori and she was indicated for 3-year interval follow-up. She says that she has had problems with intermittent right upper quadrant pain for years, however she has had increase in frequency of symptoms over the past months she is bothering her at least a few days per week.  She describes a crampy achy type of pain in the right upper quadrant that does not radiate into her chest or into her back and occurs sporadically.  This may last for 30 to 45 minutes, resolves and may come back.  She does not feel that it is aggravated postprandially or by any specific activity or foods.  No associated nausea or vomiting, no changes in bowel habits no melena or hematochezia.  She is not taking any regular NSAIDs, uses occasionally.  She says she also frequently feels more bloated and uncomfortable, and has had more heartburn on a regular basis recently as well.  She has tried taking over-the-counter antiacids without any improvement in symptoms. Status post bilateral tubal ligation and C-section. Did undergo work-up for this pain in 2015/16 with negative CT of the abdomen and pelvis, negative abdominal ultrasound and normal CCK HIDA scan-the scan mentions that she did experience symptoms during the CCK infusion.  Review of Systems Pertinent positive and negative review of systems were noted  in the above HPI section.  All other review of systems was otherwise negative.   Outpatient Encounter Medications as of 06/23/2022  Medication Sig   cyclobenzaprine (FLEXERIL) 5 MG tablet Take 1 tablet (5 mg total) by mouth 3 (three) times daily as needed.   omeprazole (PRILOSEC) 40 MG capsule Take 1 capsule (40 mg total) by mouth daily before breakfast.   No facility-administered encounter medications on file as of 06/23/2022.   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   Irritant dermatitis 12/03/2021   Neoplasm of uncertain behavior of skin 12/03/2021   PHN (postherpetic neuralgia) 07/05/2021   Eustachian tube dysfunction, bilateral 05/10/2021   Vitamin D deficiency 05/10/2021   Lynch syndrome 05/01/2021   Acute pharyngitis 03/22/2021   Insomnia 12/02/2020   Venous (peripheral) insufficiency 12/18/2017   Adenomyosis 10/24/2012   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   PREMATURE VENTRICULAR CONTRACTIONS 08/23/2008   DIZZINESS 08/15/2008   FATIGUE 05/20/2008   Palpitations 05/20/2008   TONSILLITIS, CHRONIC 03/13/2008   DEGENERATIVE DISC DISEASE, 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   SICKLE CELL TRAIT 04/09/2007   MIGRAINE HEADACHE 04/09/2007   DEEP VENOUS THROMBOPHLEBITIS, LEFT, LEG, HX OF 04/09/2007   Social History   Socioeconomic History   Marital status: Married  Spouse name: Not on file   Number of children: 3   Years of education: Not on file   Highest education level: Not on file  Occupational History   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     Employer: [employer]   Occupation: currently does not work outside the home (12/06/19)  Tobacco Use   Smoking status: Never   Smokeless  tobacco: Never  Vaping Use   Vaping Use: Never used  Substance and Sexual Activity   Alcohol use: Yes    Comment: socially rare every 1-2 months   Drug use: No   Sexual activity: Not on file  Other Topics Concern   Not on file  Social History Narrative   Not on file   Social Determinants of Health   Financial Resource Strain: Not on file  Food Insecurity: Not on file  Transportation Needs: Not on file  Physical Activity: Not on file  Stress: Not on file  Social Connections: Not on file  Intimate Partner Violence: Not on file    [name]'s family history includes Brain cancer in her maternal aunt; Cancer in her cousin and mother; Heart attack in her father; Heart disease in her mother; Hypertension in her mother; Liver cancer in her maternal aunt; Lung cancer in her maternal aunt; Pancreatitis in her father.      Objective:    Vitals:   06/23/22 1420  BP: 120/70  Pulse: 88  SpO2: 98%    Physical Exam Well-developed well-nourished AA female  in no acute distress.  Height, Weight,188  BMI 31  HEENT; nontraumatic normocephalic, EOMI, PE R LA, sclera anicteric. Oropharynx; not examined today Neck; supple, no JVD Cardiovascular; regular rate and rhythm with S1-S2, no murmur rub or gallop Pulmonary; Clear bilaterally Abdomen; soft, there is tenderness in the epigastrium and right upper quadrant, guarding or rebound nondistended, no palpable mass or hepatosplenomegaly, bowel sounds are active Rectal; not done today Skin; benign exam, no jaundice rash or appreciable lesions Extremities; no clubbing cyanosis or edema skin warm and dry Neuro/Psych; alert and oriented x4, grossly nonfocal mood and affect appropriate        Assessment & Plan:   #54 56 year old African-American female with Lynch syndrome/MLH1 mutation. She has been undergoing annual colonoscopy. Last colonoscopy December 2022 with one 3 mm hyperplastic polyp removed. She is due for follow-up  colonoscopy  #2 last surveillance EGD June 2021-centimeter hiatal hernia otherwise negative exam, gastric biopsies show benign mucosa no H. Pylori  #3 epigastric and right upper quadrant pain-patient says she has had intermittent issues for years, symptoms have progressed and have become much more frequent recently occurring at least a few days per week.  She describes crampy achy pain in the right upper quadrant no nausea or vomiting, no postprandial aggravation of symptoms. No regular NSAIDs. Has also noted increased heartburn and indigestion recently as well as abdominal bloating  Prior evaluation 2015/16 negative with negative ultrasound, negative CCK HIDA scan and negative CT  Etiology of your symptoms is not clear, she is tender on exam.  As a separate issue I think she has had exacerbation of GERD symptoms as well.  Rule out chronic gastritis, peptic ulcer disease, duodenopathy, rule out gallbladder disease biliary dyskinesia rule out other intra-abdominal inflammatory process  #4 status post bilateral tubal ligation and C-section 5.  Degenerative disc disease 6.  Migraine headaches  Plan; patient will be scheduled for colonoscopy and EGD with Dr. Hilarie [name].  Both procedures discussed in detail with  the patient including indications risk and benefits and she is agreeable to proceed.  Start omeprazole 40 mg p.o. every morning AC breakfast, prescription sent Schedule upper abdominal ultrasound Just had labs through her PCP earlier in November 2023, normal CBC and c-Met If EGD is unrevealing and ultrasound negative will need further abdominal imaging perhaps with repeat CT abdomen and pelvis.  Consider adding aspirin 325 mg daily given Lynch syndrome, however holding at present due to her epigastric pain and increased GERD symptoms until we sort out there this is related to gastropathy/duodenopathy or other issue.    Borna Wessinger Genia Harold PA-C 06/23/2022   Cc: Biagio Borg, MD

## 2022-06-24 NOTE — Progress Notes (Signed)
Addendum: Reviewed and agree with assessment and management plan. Chandrea Zellman M, MD  

## 2022-06-29 ENCOUNTER — Ambulatory Visit (HOSPITAL_COMMUNITY)
Admission: RE | Admit: 2022-06-29 | Discharge: 2022-06-29 | Disposition: A | Discharge status: Home / Self Care | Payer: Managed Care, Other (non HMO) | Source: Ambulatory Visit | Attending: Physician Assistant | Admitting: Physician Assistant

## 2022-06-29 DIAGNOSIS — Z1509 Genetic susceptibility to other malignant neoplasm: Secondary | ICD-10-CM | POA: Diagnosis not present

## 2022-06-29 DIAGNOSIS — K219 Gastro-esophageal reflux disease without esophagitis: Secondary | ICD-10-CM | POA: Diagnosis present

## 2022-06-29 DIAGNOSIS — R1013 Epigastric pain: Secondary | ICD-10-CM | POA: Diagnosis present

## 2022-06-29 DIAGNOSIS — R12 Heartburn: Secondary | ICD-10-CM | POA: Diagnosis present

## 2022-06-29 DIAGNOSIS — R1011 Right upper quadrant pain: Secondary | ICD-10-CM

## 2022-07-01 DIAGNOSIS — M25511 Pain in right shoulder: Secondary | ICD-10-CM | POA: Insufficient documentation

## 2022-07-28 ENCOUNTER — Emergency Department (HOSPITAL_BASED_OUTPATIENT_CLINIC_OR_DEPARTMENT_OTHER): Payer: Managed Care, Other (non HMO)

## 2022-07-28 ENCOUNTER — Other Ambulatory Visit: Payer: Self-pay

## 2022-07-28 ENCOUNTER — Emergency Department (HOSPITAL_BASED_OUTPATIENT_CLINIC_OR_DEPARTMENT_OTHER)
Admission: EM | Admit: 2022-07-28 | Discharge: 2022-07-28 | Disposition: A | Discharge status: Home / Self Care | Payer: Managed Care, Other (non HMO) | Attending: Emergency Medicine | Admitting: Emergency Medicine

## 2022-07-28 ENCOUNTER — Encounter (HOSPITAL_BASED_OUTPATIENT_CLINIC_OR_DEPARTMENT_OTHER): Payer: Self-pay | Admitting: Pediatrics

## 2022-07-28 DIAGNOSIS — M546 Pain in thoracic spine: Secondary | ICD-10-CM | POA: Diagnosis present

## 2022-07-28 DIAGNOSIS — Z8616 Personal history of COVID-19: Secondary | ICD-10-CM | POA: Diagnosis not present

## 2022-07-28 LAB — BASIC METABOLIC PANEL
Anion gap: 10 (ref 5–15)
BUN: 9 mg/dL (ref 6–20)
CO2: 25 mmol/L (ref 22–32)
Calcium: 9.5 mg/dL (ref 8.9–10.3)
Chloride: 103 mmol/L (ref 98–111)
Creatinine, Ser: 0.98 mg/dL (ref 0.44–1.00)
GFR, Estimated: >60 mL/min (ref >60)
Glucose, Bld: 91 mg/dL (ref 70–99)
Potassium: 3.6 mmol/L (ref 3.5–5.1)
Sodium: 138 mmol/L (ref 135–145)

## 2022-07-28 LAB — CBC
HCT: 41.5 % (ref 36.0–46.0)
Hemoglobin: 13.9 g/dL (ref 12.0–15.0)
MCH: 27.5 pg (ref 26.0–34.0)
MCHC: 33.5 g/dL (ref 30.0–36.0)
MCV: 82.2 fL (ref 80.0–100.0)
Platelets: 292 10*3/uL (ref 150–400)
RBC: 5.05 MIL/uL (ref 3.87–5.11)
RDW: 13.5 % (ref 11.5–15.5)
WBC: 3.8 10*3/uL — ABNORMAL LOW (ref 4.0–10.5)
nRBC: 0 % (ref 0.0–0.2)

## 2022-07-28 LAB — TROPONIN I (HIGH SENSITIVITY): Troponin I (High Sensitivity): 3 ng/L (ref <18)

## 2022-07-28 LAB — HCG, SERUM, QUALITATIVE: Preg, Serum: NEGATIVE

## 2022-07-28 MED ORDER — IOHEXOL 350 MG/ML SOLN
100.0000 mL | Freq: Once | INTRAVENOUS | Status: AC | PRN
  Administered 2022-07-28: 100 mL via INTRAVENOUS

## 2022-07-28 MED ORDER — OXYCODONE HCL 5 MG PO TABS: 5.0000 mg | ORAL_TABLET | Freq: Four times a day (QID) | ORAL | 0 refills | Status: AC | PRN

## 2022-07-28 MED ORDER — LIDOCAINE 5 % EX PTCH
1.0000 | MEDICATED_PATCH | CUTANEOUS | Status: DC
  Administered 2022-07-28: 1 via TRANSDERMAL
  Filled 2022-07-28: qty 1

## 2022-07-28 MED ORDER — OXYCODONE HCL 5 MG PO TABS: 5.0000 mg | ORAL_TABLET | Freq: Four times a day (QID) | ORAL | 0 refills | Status: DC | PRN

## 2022-07-28 MED ORDER — OXYCODONE HCL 5 MG PO TABS
5.0000 mg | ORAL_TABLET | Freq: Once | ORAL | Status: AC
  Administered 2022-07-28: 5 mg via ORAL
  Filled 2022-07-28: qty 1

## 2022-07-28 MED ORDER — KETOROLAC TROMETHAMINE 15 MG/ML IJ SOLN
15.0000 mg | Freq: Once | INTRAMUSCULAR | Status: AC
  Administered 2022-07-28: 15 mg via INTRAVENOUS
  Filled 2022-07-28: qty 1

## 2022-07-28 MED ORDER — LIDOCAINE 5 % EX PTCH: 1.0000 | MEDICATED_PATCH | CUTANEOUS | 0 refills | Status: DC

## 2022-07-28 NOTE — ED Triage Notes (Signed)
C/O left arm pain, "my arm feels funny" started around 10:30 am; initially had back pain on the right upper side; denies any recent injury; no shortness of breath; c/o painful breathing.

## 2022-07-28 NOTE — ED Provider Notes (Addendum)
[name] Provider Note   CSN: [id number] Arrival date & time: 07/28/22  1114     History  Chief Complaint  Patient presents with   Arm Injury   Back Pain    [name] is a 57 y.o. female.  Patient is a 57 year old female presenting for back pain.  Patient admits to pain in her thoracic back region on the left-hand side near her shoulder blade with associated shortness of breath and pain with deep breathing.  She states that started acutely while standing in her kitchen today.  History pertinent for DVT.  Not currently on blood thinners.  Denies any chest pain at this time.  Patient also admits to previous COVID-19 diagnosis on July 16, 2022.  States all of her symptoms have improved at this time.  Patient also admits to history of longstanding right sided shoulder pain that has been persistent since time of this year.  Pain is worse with movement including raising the right arm above 90 degrees and reaching backwards.  Has establish orthopedic surgeon for other issues but has not followed up for this problem yet.  The history is provided by the patient. No language interpreter was used.  Arm Injury Associated symptoms: back pain   Associated symptoms: no fever   Back Pain Associated symptoms: no abdominal pain, no chest pain, no dysuria and no fever        Home Medications Prior to Admission medications   Medication Sig Start Date End Date Taking? Authorizing Provider  cyclobenzaprine (FLEXERIL) 5 MG tablet Take 1 tablet (5 mg total) by mouth 3 (three) times daily as needed. 06/16/22   Biagio Borg, MD  omeprazole (PRILOSEC) 40 MG capsule Take 1 capsule (40 mg total) by mouth daily before breakfast. 06/23/22   Esterwood, Amy S, PA-C      Allergies    Morphine and related    Review of Systems   Review of Systems  Constitutional:  Negative for chills and fever.  HENT:  Negative for ear pain and sore throat.   Eyes:  Negative for  pain and visual disturbance.  Respiratory:  Positive for shortness of breath. Negative for cough.   Cardiovascular:  Negative for chest pain and palpitations.  Gastrointestinal:  Negative for abdominal pain and vomiting.  Genitourinary:  Negative for dysuria and hematuria.  Musculoskeletal:  Positive for back pain. Negative for arthralgias.  Skin:  Negative for color change and rash.  Neurological:  Negative for seizures and syncope.  All other systems reviewed and are negative.   Physical Exam Updated Vital Signs BP 130/87   Pulse 70   Temp 97.6 F (36.4 C) (Oral)   Resp 14   Ht '5\' 5"'$  (1.651 m)   Wt 81.6 kg   LMP 10/05/2012   SpO2 100%   BMI 29.95 kg/m  Physical Exam Vitals and nursing note reviewed.  Constitutional:      General: She is not in acute distress.    Appearance: She is well-developed.  HENT:     Head: Normocephalic and atraumatic.  Eyes:     Conjunctiva/sclera: Conjunctivae normal.  Cardiovascular:     Rate and Rhythm: Normal rate and regular rhythm.     Heart sounds: No murmur heard. Pulmonary:     Effort: Pulmonary effort is normal. No respiratory distress.     Breath sounds: Normal breath sounds.  Abdominal:     Palpations: Abdomen is soft.     Tenderness: There is no  abdominal tenderness.  Musculoskeletal:        General: No swelling.     Cervical back: Neck supple.  Skin:    General: Skin is warm and dry.     Capillary Refill: Capillary refill takes less than 2 seconds.  Neurological:     Mental Status: She is alert.  Psychiatric:        Mood and Affect: Mood normal.     ED Results / Procedures / Treatments   Labs (all labs ordered are listed, but only abnormal results are displayed) Labs Reviewed  CBC - Abnormal; Notable for the following components:      Result Value   WBC 3.8 (*)    All other components within normal limits  BASIC METABOLIC PANEL  HCG, SERUM, QUALITATIVE  TROPONIN I (HIGH SENSITIVITY)    EKG EKG  Interpretation  Date/Time:  Wednesday July 28 2022 11:48:05 EST Ventricular Rate:  70 PR Interval:  135 QRS Duration: 83 QT Interval:  385 QTC Calculation: 416 R Axis:   56 Text Interpretation: Sinus rhythm Borderline T abnormalities, anterior leads Confirmed by Campbell Stall ([id number]) on 7/62/8315 3:21:58 PM  Radiology CT Angio Chest PE W and/or Wo Contrast  Result Date: 07/28/2022 CLINICAL DATA:  Back pain, pain left arm EXAM: CT ANGIOGRAPHY CHEST WITH CONTRAST TECHNIQUE: Multidetector CT imaging of the chest was performed using the standard protocol during bolus administration of intravenous contrast. Multiplanar CT image reconstructions and MIPs were obtained to evaluate the vascular anatomy. RADIATION DOSE REDUCTION: This exam was performed according to the departmental dose-optimization program which includes automated exposure control, adjustment of the mA and/or kV according to patient size and/or use of iterative reconstruction technique. CONTRAST:  166m OMNIPAQUE IOHEXOL 350 MG/ML SOLN COMPARISON:  11/04/2017 FINDINGS: Cardiovascular: There are no intraluminal filling defects in pulmonary artery branches. There is homogeneous enhancement in thoracic aorta. Major branches of thoracic aorta appear patent. Mediastinum/Nodes: No significant lymphadenopathy seen. Lungs/Pleura: There is no focal pulmonary infiltrate. There is no pleural effusion or pneumothorax. Upper Abdomen: Small hiatal hernia is seen. Musculoskeletal: No acute findings are seen. Review of the MIP images confirms the above findings. IMPRESSION: There is no evidence of pulmonary embolism. There is no evidence of thoracic aortic dissection. No focal pulmonary infiltrates are seen. Small hiatal hernia. Electronically Signed   By: PElmer PickerM.D.   On: 07/28/2022 15:08   DG Shoulder Right  Result Date: 07/28/2022 CLINICAL DATA:  Shoulder pain. EXAM: RIGHT SHOULDER-4 VIEW COMPARISON:  None Available. FINDINGS: There is no  evidence of fracture or dislocation. There is no evidence of arthropathy or other focal bone abnormality. Soft tissues are unremarkable. IMPRESSION: No acute osseous abnormality Electronically Signed   By: AJill SideM.D.   On: 07/28/2022 12:53   DG Chest 2 View  Result Date: 07/28/2022 CLINICAL DATA:  Sudden onset left upper back and shoulder pain. EXAM: CHEST - 2 VIEW COMPARISON:  Chest x-ray dated November 04, 2017. FINDINGS: The heart size and mediastinal contours are within normal limits. Both lungs are clear. The visualized skeletal structures are unremarkable. IMPRESSION: No active cardiopulmonary disease. Electronically Signed   By: WTitus DubinM.D.   On: 07/28/2022 11:45    Procedures Ultrasound ED Peripheral IV (Provider)  Date/Time: 07/28/2022 2:59 PM  Performed by: GLianne Cure DO Authorized by: GLianne Cure DO   Procedure details:    Indications: multiple failed IV attempts and poor IV access     Skin Prep: chlorhexidine gluconate  Location:  Right AC   Angiocath:  16 G   Bedside Ultrasound Guided: Yes     Images: not archived     Patient tolerated procedure without complications: Yes     Dressing applied: Yes       Medications Ordered in ED Medications  lidocaine (LIDODERM) 5 % 1 patch (has no administration in time range)  oxyCODONE (Oxy IR/ROXICODONE) immediate release tablet 5 mg (has no administration in time range)  ketorolac (TORADOL) 15 MG/ML injection 15 mg (15 mg Intravenous Given 07/28/22 1415)  iohexol (OMNIPAQUE) 350 MG/ML injection 100 mL (100 mLs Intravenous Contrast Given 07/28/22 1455)    ED Course/ Medical Decision Making/ A&P                           Medical Decision Making Amount and/or Complexity of Data Reviewed Labs: ordered. Radiology: ordered.  Risk Prescription drug management.   57 year old female presenting for upper back pain.  Patient is alert and oriented x 3, no acute distress, afebrile, so vital signs.  Physical exam  demonstrates no reproducible pain on palpation of thoracic spine, posterior ribs, or scapula.  No rashes.  Chest x-ray demonstrates no acute process.  Will obtain CT PE to rule out pulmonary embolism secondary to previous DVT history, no current blood thinner use, and increased risk for blood clots secondary to recent COVID diagnosis.  EKG demonstrates sinus rhythm.  No ST segment elevation or depression.  Troponin, electrolytes, chest x-ray demonstrates no acute process.  CT pulmonary embolism demonstrates no pulmonary embolism.  No pneumothorax.  No pneumonia.  No pericardial effusions.  No aortic dissections.  Patient does not have any right upper quadrant pain.  Negative Murphy sign.  Gallbladder pathology considered but low likelihood at this time.  No rashes or signs of shingles at this time.  Patient also presented for right-sided shoulder pain since September.  X-ray demonstrates no acute fractures or pathology.  Recommended for close follow-up with established orthopedic surgeon for evaluation of rotator cuff tear.  Stable for DC home and close follow-up with PCP. Patient in no distress and overall condition improved here in the ED. Detailed discussions were had with the patient regarding current findings, and need for close f/u with PCP or on call doctor. The patient has been instructed to return immediately if the symptoms worsen in any way for re-evaluation. Patient verbalized understanding and is in agreement with current care plan. All questions answered prior to discharge.       Final Clinical Impression(s) / ED Diagnoses Final diagnoses:  Acute left-sided thoracic back pain    Rx / DC Orders ED Discharge Orders     None         Lianne Cure, DO 35/45/62 [id number]     Lianne Cure, DO 93/73/42 [id number]

## 2022-08-01 ENCOUNTER — Encounter: Payer: Self-pay | Admitting: Certified Registered Nurse Anesthetist

## 2022-08-02 ENCOUNTER — Telehealth: Payer: Self-pay

## 2022-08-02 NOTE — Telephone Encounter (Signed)
Called patient to come in early for an 3:00pm procedure.  She will arrive by 2:00 pm

## 2022-08-03 ENCOUNTER — Encounter: Payer: Self-pay | Admitting: Internal Medicine

## 2022-08-03 ENCOUNTER — Ambulatory Visit: Payer: Managed Care, Other (non HMO) | Admitting: Internal Medicine

## 2022-08-03 VITALS — BP 160/114 | HR 71 | Temp 97.7°F | Resp 16 | Ht 65.0 in | Wt 188.0 lb

## 2022-08-03 DIAGNOSIS — Z09 Encounter for follow-up examination after completed treatment for conditions other than malignant neoplasm: Secondary | ICD-10-CM | POA: Diagnosis present

## 2022-08-03 DIAGNOSIS — Z86010 Personal history of colonic polyps: Secondary | ICD-10-CM | POA: Diagnosis not present

## 2022-08-03 DIAGNOSIS — R1011 Right upper quadrant pain: Secondary | ICD-10-CM | POA: Diagnosis not present

## 2022-08-03 DIAGNOSIS — K253 Acute gastric ulcer without hemorrhage or perforation: Secondary | ICD-10-CM

## 2022-08-03 DIAGNOSIS — Z1509 Genetic susceptibility to other malignant neoplasm: Secondary | ICD-10-CM | POA: Diagnosis not present

## 2022-08-03 DIAGNOSIS — R12 Heartburn: Secondary | ICD-10-CM | POA: Diagnosis not present

## 2022-08-03 DIAGNOSIS — K3189 Other diseases of stomach and duodenum: Secondary | ICD-10-CM

## 2022-08-03 MED ORDER — PANTOPRAZOLE SODIUM 40 MG PO TBEC: DELAYED_RELEASE_TABLET | ORAL | 3 refills | Status: DC

## 2022-08-03 MED ORDER — SODIUM CHLORIDE 0.9 % IV SOLN: 500.0000 mL | INTRAVENOUS | Status: DC

## 2022-08-03 NOTE — Progress Notes (Signed)
Report given to PACU, vss 

## 2022-08-03 NOTE — Progress Notes (Signed)
1600 Dr Hilarie Fredrickson at bedside with patient continue to cough, Solu- Medrol 60 mg IV given. vss

## 2022-08-03 NOTE — Op Note (Signed)
[facility] Patient Name: [name] Procedure Date: 08/03/2022 2:59 PM MRN: [id number] Endoscopist: Jerene Bears , MD, [id number] Age: 57 Referring MD:  Date of Birth: 08-20-1965 Gender: Female Account #: [id number] Procedure:                Upper GI endoscopy Indications:              Epigastric abdominal pain, Abdominal pain in the                            right upper quadrant, Heartburn, Lynch Syndrome                            (MLH1) Medicines:                Monitored Anesthesia Care Procedure:                Pre-Anesthesia Assessment:                           - Prior to the procedure, a History and Physical                            was performed, and patient medications and                            allergies were reviewed. The patient's tolerance of                            previous anesthesia was also reviewed. The risks                            and benefits of the procedure and the sedation                            options and risks were discussed with the patient.                            All questions were answered, and informed consent                            was obtained. Prior Anticoagulants: The patient has                            taken no anticoagulant or antiplatelet agents. ASA                            Grade Assessment: II - A patient with mild systemic                            disease. After reviewing the risks and benefits,                            the patient was deemed in satisfactory condition to  undergo the procedure.                           After obtaining informed consent, the endoscope was                            passed under direct vision. Throughout the                            procedure, the patient's blood pressure, pulse, and                            oxygen saturations were monitored continuously. The                            GIF [id number] #[id number] was introduced through the                             mouth, and advanced to the second part of duodenum.                            The upper GI endoscopy was accomplished without                            difficulty. The patient tolerated the procedure                            well. Scope In: Scope Out: Findings:                 The examined esophagus was normal.                           A 2 cm hiatal hernia was present.                           One non-bleeding cratered gastric ulcer with no                            stigmata of bleeding was found in the gastric                            antrum. The lesion was 5 mm in largest dimension.                            Biopsies were taken with a cold forceps for                            histology and Helicobacter pylori testing.                           The examined duodenum was normal. Complications:            No immediate complications. Estimated Blood Loss:     Estimated blood loss was minimal. Impression:               -  Normal esophagus.                           - 2 cm hiatal hernia.                           - Non-bleeding gastric ulcer with no stigmata of                            bleeding. Biopsied.                           - Normal examined duodenum. Recommendation:           - Patient has a contact number available for                            emergencies. The signs and symptoms of potential                            delayed complications were discussed with the                            patient. Return to normal activities tomorrow.                            Written discharge instructions were provided to the                            patient.                           - Resume previous diet.                           - Continue present medications. Omeprazole                            recommended at last office visit was cost                            prohibitive, thus begin pantoprazole 40 mg twice                            daily before  1st and last meal of the day x 4                            weeks, then once daily x 4-8 weeks. Can be extended                            once daily if helpful with GERD symptoms.                           - Call my office for additional recommendations if  persistent heartburn or upper abdominal pain                            despite treatment.                           - Await pathology results. Follow-up H. Pylori and                            treat if positive.                           - See the other procedure note for documentation of                            additional recommendations. Jerene Bears, MD 08/03/2022 3:32:57 PM This report has been signed electronically.

## 2022-08-03 NOTE — Patient Instructions (Addendum)
Handouts on hiatal hernia given to patient.  Await pathology results. Follow-up H. Pylori and treat if positive. Pick up prescription for Pantoprazole from [facility] off of [address]  Take Pantoprazole 40 mg twice daily before 1st and last meal of the day X 4 weeks, then once daily X 4-8 weeks. Can be extended once daily if helpful with GERD symptoms. Call office for additional recommendations if persistent heartburn or upper abdominal pain despite treatment. Repeat colonoscopy in 1 years for surveillance purposes! Resume previous diet and continue present medications.   YOU HAD AN ENDOSCOPIC PROCEDURE TODAY AT [facility]:   Refer to the procedure report that was given to you for any specific questions about what was found during the examination.  If the procedure report does not answer your questions, please call your gastroenterologist to clarify.  If you requested that your care partner not be given the details of your procedure findings, then the procedure report has been included in a sealed envelope for you to review at your convenience later.  YOU SHOULD EXPECT: Some feelings of bloating in the abdomen. Passage of more gas than usual.  Walking can help get rid of the air that was put into your GI tract during the procedure and reduce the bloating. If you had a lower endoscopy (such as a colonoscopy or flexible sigmoidoscopy) you may notice spotting of blood in your stool or on the toilet paper. If you underwent a bowel prep for your procedure, you may not have a normal bowel movement for a few days.  Please Note:  You might notice some irritation and congestion in your nose or some drainage.  This is from the oxygen used during your procedure.  There is no need for concern and it should clear up in a day or so.  SYMPTOMS TO REPORT IMMEDIATELY:  Following lower endoscopy (colonoscopy or flexible sigmoidoscopy):  Excessive amounts of blood in the stool  Significant  tenderness or worsening of abdominal pains  Swelling of the abdomen that is new, acute  Fever of 100F or higher  Following upper endoscopy (EGD)  Vomiting of blood or coffee ground material  New chest pain or pain under the shoulder blades  Painful or persistently difficult swallowing  New shortness of breath  Fever of 100F or higher  Black, tarry-looking stools  For urgent or emergent issues, a gastroenterologist can be reached at any hour by calling [phone]. Do not use MyChart messaging for urgent concerns.    DIET:  We do recommend a small meal at first, but then you may proceed to your regular diet.  Drink plenty of fluids but you should avoid alcoholic beverages for 24 hours.  ACTIVITY:  You should plan to take it easy for the rest of today and you should NOT DRIVE or use heavy machinery until tomorrow (because of the sedation medicines used during the test).    FOLLOW UP: Our staff will call the number listed on your records the next business day following your procedure.  We will call around 7:15- 8:00 am to check on you and address any questions or concerns that you may have regarding the information given to you following your procedure. If we do not reach you, we will leave a message.     If any biopsies were taken you will be contacted by phone or by letter within the next 1-3 weeks.  Please call us at [phone] if you have not heard about the  biopsies in 3 weeks.    SIGNATURES/CONFIDENTIALITY: You and/or your care partner have signed paperwork which will be entered into your electronic medical record.  These signatures attest to the fact that that the information above on your After Visit Summary has been reviewed and is understood.  Full responsibility of the confidentiality of this discharge information lies with you and/or your care-partner.

## 2022-08-03 NOTE — Progress Notes (Signed)
Called to room to assist during endoscopic procedure.  Patient ID and intended procedure confirmed with present staff. Received instructions for my participation in the procedure from the performing physician.

## 2022-08-03 NOTE — Progress Notes (Signed)
1545 Pt awake but continuously coughing and c/o SOB, vss  Albuterol neb given.  Denies nausea.

## 2022-08-03 NOTE — Progress Notes (Signed)
1503 Robinul 0.1 mg IV given due large amount of secretions upon assessment.  MD made aware, vss

## 2022-08-03 NOTE — Progress Notes (Signed)
Patient reports no changes to health or medications since office visit.  

## 2022-08-03 NOTE — Progress Notes (Signed)
1625 Solu Medrol 65 mg IV given due to patient continue to cough and c/o SOB.  Vss  Dr Hilarie Fredrickson updated.

## 2022-08-03 NOTE — Progress Notes (Signed)
GASTROENTEROLOGY PROCEDURE H&P NOTE   Primary Care Physician: Biagio Borg, MD    Reason for Procedure:  Lynch syndrome, epigastric and right upper quadrant pain  Plan:    EGD and colonoscopy  Patient is appropriate for endoscopic procedure(s) in the ambulatory ([facility]) setting.  The nature of the procedure, as well as the risks, benefits, and alternatives were carefully and thoroughly reviewed with the patient. Ample time for discussion and questions allowed. The patient understood, was satisfied, and agreed to proceed.     HPI: [name] is a 57 y.o. female who presents for EGD and colonoscopy.  Medical history as below.  Tolerated the prep.  No recent chest pain or shortness of breath.  No abdominal pain today.  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   DIZZINESS 08/15/2008   DYSPNEA 05/20/2008   FATIGUE 05/20/2008   History of 2019 novel coronavirus disease (COVID-19) 05/23/2019   History of hiatal hernia    HSV infection    Hyperplastic colon polyp    KNEE PAIN, LEFT 09/11/2007   Left sided sciatica 04/19/2011   Lumbar herniated disc    Lynch syndrome    MIGRAINE HEADACHE 04/09/2007   Ovarian cyst    Palpitations 05/20/2008   Pancreatic lesion    PONV (postoperative nausea and vomiting)    PREMATURE VENTRICULAR CONTRACTIONS 08/23/2008   RASH-NONVESICULAR 01/17/2008   RUQ PAIN 02/23/2010   SICKLE CELL TRAIT 04/09/2007   Tension headache 04/09/2007   VARICOSE VEINS LOWER EXTREMITIES W/OTH COMPS 09/11/2007   Wears glasses     Past Surgical History:  Procedure Laterality Date    CESAREAN SECTION  2004   COLONOSCOPY  2016   FINGER FRACTURE SURGERY Left 03/2019   LAPAROSCOPIC ASSISTED VAGINAL HYSTERECTOMY N/A 10/23/2012   Procedure: LAPAROSCOPIC ASSISTED VAGINAL HYSTERECTOMY;  Surgeon: Margarette Asal, MD;  Location: [facility];  Service: Gynecology;  Laterality: N/A;   TONSILLECTOMY  07/18/2009   TUBAL LIGATION  01/06/2012   with cyst removal   UPPER GASTROINTESTINAL ENDOSCOPY      Prior to Admission medications   Medication Sig Start Date End Date Taking? Authorizing Provider  PARoxetine (PAXIL) 10 MG tablet Take 10 mg by mouth daily. 07/24/22   [provider]    Current Outpatient Medications  Medication Sig Dispense Refill   PARoxetine (PAXIL) 10 MG tablet Take 10 mg by mouth daily.     Current Facility-Administered Medications  Medication Dose Route Frequency Provider Last Rate Last Admin   0.9 %  sodium chloride infusion  500 mL Intravenous Continuous Arrin Pintor, Lajuan Lines, MD        Allergies as of 08/03/2022 - Review Complete 08/03/2022  Allergen Reaction Noted   Morphine and related Rash 10/21/2014    Family History  Problem Relation Age of Onset   Heart disease Mother    Hypertension Mother    Cancer Mother        ovarian    Heart attack Father    Pancreatitis Father    Liver cancer Maternal Aunt    Brain cancer Maternal Aunt  Lung cancer Maternal Aunt    Cancer Cousin        breast   Colon cancer Neg Hx    Colon polyps Neg Hx    Esophageal cancer Neg Hx    Rectal cancer Neg Hx    Stomach cancer Neg Hx     Social History   Socioeconomic History   Marital status: Married    Spouse name: Not on file   Number of children: 3   Years of education: Not on file   Highest education level: Not on file  Occupational History   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     Employer: [employer]   Occupation: currently does not work outside the home (12/06/19)  Tobacco Use   Smoking status: Never   Smokeless tobacco: Never   Vaping Use   Vaping Use: Never used  Substance and Sexual Activity   Alcohol use: Yes    Comment: socially rare every 1-2 months   Drug use: No   Sexual activity: Not on file  Other Topics Concern   Not on file  Social History Narrative   Not on file   Social Determinants of Health   Financial Resource Strain: Not on file  Food Insecurity: Not on file  Transportation Needs: Not on file  Physical Activity: Not on file  Stress: Not on file  Social Connections: Not on file  Intimate Partner Violence: Not on file    Physical Exam: Vital signs in last 24 hours: '@BP'$  (!) 149/92   Pulse 97   Temp 97.7 F (36.5 C)   Ht '5\' 5"'$  (1.651 m)   Wt 188 lb (85.3 kg)   LMP 10/05/2012   SpO2 100%   BMI 31.28 kg/m  GEN: NAD EYE: Sclerae anicteric ENT: MMM CV: Non-tachycardic Pulm: CTA b/l GI: Soft, NT/ND NEURO:  Alert & Oriented x 3   Zenovia Jarred, MD [facility]  08/03/2022 2:54 PM

## 2022-08-03 NOTE — Op Note (Signed)
[facility] Patient Name: [name] Procedure Date: 08/03/2022 2:54 PM MRN: [id number] Endoscopist: Jerene Bears , MD, [id number] Age: 57 Referring MD:  Date of Birth: 04/11/1966 Gender: Female Account #: [id number] Procedure:                Colonoscopy Indications:              Last colonoscopy: December 2022, Lynch Syndrome                            (MLH1 gene mutation) Medicines:                Monitored Anesthesia Care Procedure:                Pre-Anesthesia Assessment:                           - Prior to the procedure, a History and Physical                            was performed, and patient medications and                            allergies were reviewed. The patient's tolerance of                            previous anesthesia was also reviewed. The risks                            and benefits of the procedure and the sedation                            options and risks were discussed with the patient.                            All questions were answered, and informed consent                            was obtained. Prior Anticoagulants: The patient has                            taken no anticoagulant or antiplatelet agents. ASA                            Grade Assessment: II - A patient with mild systemic                            disease. After reviewing the risks and benefits,                            the patient was deemed in satisfactory condition to                            undergo the procedure.  After obtaining informed consent, the colonoscope                            was passed under direct vision. Throughout the                            procedure, the patient's blood pressure, pulse, and                            oxygen saturations were monitored continuously. The                            Olympus [id number]-[id number] (#[id number]) Colonoscope was                            introduced through the anus and advanced to the                             cecum, identified by appendiceal orifice and                            ileocecal valve. The colonoscopy was performed                            without difficulty. The patient tolerated the                            procedure well. The quality of the bowel                            preparation was good. The ileocecal valve,                            appendiceal orifice, and rectum were photographed. Scope In: 3:14:48 PM Scope Out: 3:27:47 PM Scope Withdrawal Time: 0 hours 9 minutes 9 seconds  Total Procedure Duration: 0 hours 12 minutes 59 seconds  Findings:                 The digital rectal exam was normal.                           The entire examined colon appeared normal on direct                            and retroflexion views. Complications:            No immediate complications. Estimated Blood Loss:     Estimated blood loss: none. Impression:               - The entire examined colon is normal on direct and                            retroflexion views.                           - No specimens collected. Recommendation:           -  Patient has a contact number available for                            emergencies. The signs and symptoms of potential                            delayed complications were discussed with the                            patient. Return to normal activities tomorrow.                            Written discharge instructions were provided to the                            patient.                           - Resume previous diet.                           - Continue present medications.                           - Repeat colonoscopy in 1 year for screening                            purposes. Jerene Bears, MD 08/03/2022 3:36:51 PM This report has been signed electronically.

## 2022-08-04 ENCOUNTER — Telehealth: Payer: Self-pay

## 2022-08-04 NOTE — Telephone Encounter (Signed)
  Follow up Call-     08/03/2022    2:22 PM 06/18/2021    2:17 PM 01/11/2020    1:55 PM  Call back number  Post procedure Call Back phone  # [phone]  Permission to leave phone message No No No     Patient questions:  Do you have a fever, pain , or abdominal swelling? No. Pain Score  0 * patient reports heartburn post procedure but it has since resolved  Have you tolerated food without any problems? Yes.    Have you been able to return to your normal activities? Yes.    Do you have any questions about your discharge instructions: Diet   No. Medications  No. Follow up visit  No.  Do you have questions or concerns about your Care? No.  Actions: * If pain score is 4 or above: No action needed, pain <4.

## 2022-08-06 ENCOUNTER — Encounter: Payer: Self-pay | Admitting: Internal Medicine

## 2022-08-06 ENCOUNTER — Encounter: Payer: Managed Care, Other (non HMO) | Admitting: Internal Medicine

## 2022-09-16 DIAGNOSIS — M7541 Impingement syndrome of right shoulder: Secondary | ICD-10-CM | POA: Insufficient documentation

## 2022-10-25 ENCOUNTER — Ambulatory Visit: Payer: Managed Care, Other (non HMO) | Admitting: Internal Medicine

## 2022-10-25 ENCOUNTER — Encounter: Payer: Self-pay | Admitting: Internal Medicine

## 2022-10-25 VITALS — BP 120/80 | HR 81 | Temp 98.0°F | Wt 183.0 lb

## 2022-10-25 DIAGNOSIS — E559 Vitamin D deficiency, unspecified: Secondary | ICD-10-CM | POA: Diagnosis not present

## 2022-10-25 DIAGNOSIS — H6692 Otitis media, unspecified, left ear: Secondary | ICD-10-CM | POA: Diagnosis not present

## 2022-10-25 DIAGNOSIS — M79662 Pain in left lower leg: Secondary | ICD-10-CM

## 2022-10-25 DIAGNOSIS — M7989 Other specified soft tissue disorders: Secondary | ICD-10-CM | POA: Insufficient documentation

## 2022-10-25 DIAGNOSIS — J309 Allergic rhinitis, unspecified: Secondary | ICD-10-CM | POA: Insufficient documentation

## 2022-10-25 MED ORDER — CEFUROXIME AXETIL 250 MG PO TABS: 250.0000 mg | ORAL_TABLET | Freq: Two times a day (BID) | ORAL | 0 refills | Status: AC

## 2022-10-25 NOTE — Assessment & Plan Note (Signed)
Can't r/o recurrent dvt - for LLE venous doppler stat

## 2022-10-25 NOTE — Progress Notes (Signed)
Patient ID: [name], female   DOB: 02/18/1966, 57 y.o.   MRN: [id number]        Chief Complaint: follow up left ear pain, left leg pain and swelling, low vit d, allergies       HPI:  [name] is a 57 y.o. female here with hx of DVT LLE, now with 2 days worsening LLE pain and swelling below the knee but also mild swelling above the knee, Pt denies chest pain, increased sob or doe, wheezing, orthopnea, PND, increased LE swelling, palpitations, dizziness or syncope.  Pt denies polydipsia, polyuria, or new focal neuro s/s.    Pt denies fever, wt loss, night sweats, loss of appetite, or other constitutional symptoms, but does have 3 days worsening left ear pain as well, with mild recent sinus allergy symptoms       Wt Readings from Last 3 Encounters:  10/25/22 183 lb (83 kg)  08/03/22 188 lb (85.3 kg)  07/28/22 180 lb (81.6 kg)   BP Readings from Last 3 Encounters:  10/25/22 120/80  08/03/22 (!) 160/114  07/28/22 (!) 141/101         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   DIZZINESS 08/15/2008   DYSPNEA 05/20/2008   FATIGUE 05/20/2008   History of 2019 novel coronavirus disease (COVID-19) 05/23/2019   History of hiatal hernia    HSV infection    Hyperplastic colon polyp    KNEE PAIN, LEFT 09/11/2007   Left sided sciatica 04/19/2011   Lumbar herniated disc    Lynch syndrome    MIGRAINE HEADACHE 04/09/2007   Ovarian cyst    Palpitations 05/20/2008   Pancreatic lesion    PONV (postoperative nausea and vomiting)    PREMATURE VENTRICULAR CONTRACTIONS 08/23/2008   RASH-NONVESICULAR 01/17/2008    RUQ PAIN 02/23/2010   SICKLE CELL TRAIT 04/09/2007   Tension headache 04/09/2007   VARICOSE VEINS LOWER EXTREMITIES W/OTH COMPS 09/11/2007   Wears glasses    Past Surgical History:  Procedure Laterality Date   CESAREAN SECTION  2004   COLONOSCOPY  2016   FINGER FRACTURE SURGERY Left 03/2019   LAPAROSCOPIC ASSISTED VAGINAL HYSTERECTOMY N/A 10/23/2012   Procedure: LAPAROSCOPIC ASSISTED VAGINAL HYSTERECTOMY;  Surgeon: Meriel Picaichard M Holland, MD;  Location: [facility];  Service: Gynecology;  Laterality: N/A;   TONSILLECTOMY  07/18/2009   TUBAL LIGATION  01/06/2012   with cyst removal   UPPER GASTROINTESTINAL ENDOSCOPY      reports that she has never smoked. She has never used smokeless tobacco. She reports current alcohol use. She reports that she does not use drugs. family history includes Brain cancer in her maternal aunt; Cancer in her cousin and mother; Heart attack in her father; Heart disease in her mother; Hypertension in her mother; Liver cancer in her maternal aunt; Lung cancer in her maternal aunt; Pancreatitis in her father. Allergies  Allergen Reactions   Morphine And Related Rash   Current Outpatient Medications on File Prior to Visit  Medication Sig Dispense Refill   pantoprazole (PROTONIX) 40 MG tablet Begin pantoprazole 40 mg twice daily before 1st  and last meal of the day X 4 weeks, then once daily X 4-8 weeks 90 tablet 3   PARoxetine (PAXIL) 10 MG tablet Take 10 mg by mouth daily.     Current Facility-Administered Medications on File Prior to Visit  Medication Dose Route Frequency Provider Last Rate Last Admin   0.9 %  sodium chloride infusion  500 mL Intravenous Continuous Pyrtle, Carie Caddy, MD            ROS:  All others reviewed and negative.  Objective        PE:  BP 120/80   Pulse 81   Temp 98 F (36.7 C) (Oral)   Wt 183 lb (83 kg)   LMP 10/05/2012   SpO2 96%   BMI 30.45 kg/m                 Constitutional: Pt appears in NAD               HENT: Head: NCAT.                 Right Ear: External ear normal.                 Left Ear: External ear normal. Left TM with marked erythema, mild bulging               Eyes: . Pupils are equal, round, and reactive to light. Conjunctivae and EOM are normal               Nose: without d/c or deformity               Neck: Neck supple. Gross normal ROM               Cardiovascular: Normal rate and regular rhythm.                 Pulmonary/Chest: Effort normal and breath sounds without rales or wheezing.                Abd:  Soft, NT, ND, + BS, no organomegaly               Neurological: Pt is alert. At baseline orientation, motor grossly intact               Skin: Skin is warm. No rashes, no other new lesions, LE edema - with hwle leg mild swelling pt states more than baseline post dvt swelling, and tender mid post calf, equivocal homans sign               Psychiatric: Pt behavior is normal without agitation   Micro: none  Cardiac tracings I have personally interpreted today:  none  Pertinent Radiological findings (summarize): none   Lab Results  Component Value Date   WBC 3.8 (L) 07/28/2022   HGB 13.9 07/28/2022   HCT 41.5 07/28/2022   PLT 292 07/28/2022   GLUCOSE 91 07/28/2022   CHOL 185 06/16/2022   TRIG 40.0 06/16/2022   HDL 78.00 06/16/2022   LDLCALC 99 06/16/2022   ALT 18 06/16/2022   AST 23 06/16/2022   NA 138 07/28/2022   K 3.6 07/28/2022   CL 103 07/28/2022   CREATININE 0.98 07/28/2022   BUN 9 07/28/2022   CO2 25 07/28/2022   TSH 0.78 06/16/2022   INR 1.0 03/01/2007   HGBA1C 6.0 06/16/2022   Assessment/Plan:  [name] is a 57 y.o. Black or African American [2] female with  has  a past medical history of ACUTE NASOPHARYNGITIS (05/07/2010), Allergic rhinitis, cause unspecified (04/19/2011), Allergy, Anemia, Anxiety, BACK PAIN (02/05/2010), CELLULITIS (01/13/2010), CHEST PAIN UNSPECIFIED (08/23/2008), CONSTIPATION, CHRONIC (04/09/2007), DEEP VENOUS THROMBOPHLEBITIS, LEFT, LEG, HX OF  (04/09/2007), DEGENERATIVE DISC DISEASE, LUMBAR SPINE (05/12/2007), DIZZINESS (08/15/2008), DYSPNEA (05/20/2008), FATIGUE (05/20/2008), History of 2019 novel coronavirus disease (COVID-19) (05/23/2019), History of hiatal hernia, HSV infection, Hyperplastic colon polyp, KNEE PAIN, LEFT (09/11/2007), Left sided sciatica (04/19/2011), Lumbar herniated disc, Lynch syndrome, MIGRAINE HEADACHE (04/09/2007), Ovarian cyst, Palpitations (05/20/2008), Pancreatic lesion, PONV (postoperative nausea and vomiting), PREMATURE VENTRICULAR CONTRACTIONS (08/23/2008), RASH-NONVESICULAR (01/17/2008), RUQ PAIN (02/23/2010), SICKLE CELL TRAIT (04/09/2007), Tension headache (04/09/2007), VARICOSE VEINS LOWER EXTREMITIES W/OTH COMPS (09/11/2007), and Wears glasses.  Pain and swelling of left lower leg Can't r/o recurrent dvt - for LLE venous doppler stat  Left otitis media Mild to mod, for antibx course doxycycline 100 bid,  to f/u any worsening symptoms or concerns  Vitamin D deficiency Last vitamin D Lab Results  Component Value Date   VD25OH 44.43 06/16/2022   Stable, cont oral replacement   Allergic rhinitis Mild for add allegra and /or nasacort asd  Followup: Return if symptoms worsen or fail to improve.  Oliver Barre, MD 10/25/2022 8:46 PM [facility]

## 2022-10-25 NOTE — Assessment & Plan Note (Signed)
Mild to mod, for antibx course doxycycline 100 bid,  to f/u any worsening symptoms or concerns 

## 2022-10-25 NOTE — Patient Instructions (Addendum)
Please take all new medication as prescribed - the antibiotic  You can also take Mucinex (or it's generic off brand) for congestion, and tylenol as needed for pain.  Please also take OTC Allegra (or Zyrtec) and Nasacort for allergies, as this can also help the eyes and ears  Please continue all other medications as before, and refills have been done if requested.  Please have the pharmacy call with any other refills you may need.  Please keep your appointments with your specialists as you may have planned  You will be contacted regarding the referral for: Leg circulation testing for blood clot - stat  You will be contacted by phone if any changes need to be made immediately.  Otherwise, you will receive a letter about your results with an explanation, but please check with MyChart first.  Please remember to sign up for MyChart if you have not done so, as this will be important to you in the future with finding out test results, communicating by private email, and scheduling acute appointments online when needed.

## 2022-10-25 NOTE — Assessment & Plan Note (Signed)
Last vitamin D Lab Results  Component Value Date   VD25OH 44.43 06/16/2022   Stable, cont oral replacement

## 2022-10-25 NOTE — Assessment & Plan Note (Signed)
Mild for add allegra and /or nasacort asd

## 2022-10-26 ENCOUNTER — Ambulatory Visit (HOSPITAL_COMMUNITY)
Admission: RE | Admit: 2022-10-26 | Discharge: 2022-10-26 | Disposition: A | Discharge status: Home / Self Care | Payer: Managed Care, Other (non HMO) | Source: Ambulatory Visit | Attending: Internal Medicine | Admitting: Internal Medicine

## 2022-10-26 DIAGNOSIS — M79662 Pain in left lower leg: Secondary | ICD-10-CM | POA: Diagnosis present

## 2022-10-26 DIAGNOSIS — M7989 Other specified soft tissue disorders: Secondary | ICD-10-CM | POA: Insufficient documentation

## 2022-11-12 ENCOUNTER — Telehealth: Payer: Self-pay | Admitting: Internal Medicine

## 2022-11-12 NOTE — Telephone Encounter (Signed)
Pt called and states she has continued to have Left sided abd pain. Requesting to be seen and cannot wait to see Dr. Rhea Belton in July. Pt scheduled to see Quentin Mulling PA 11/19/22 at 11am. Pt aware of appt.

## 2022-11-12 NOTE — Telephone Encounter (Signed)
Patient called states she has been having a lot of abdominal [pain seeking advise. 

## 2022-11-18 NOTE — Progress Notes (Signed)
11/19/2022 [name] [id number] 29-Jul-1965  Referring provider: Corwin Levins, MD Primary GI doctor: Dr. Rhea Belton  ASSESSMENT AND PLAN:   57 year old female with history of Lynch syndrome up-to-date on endoscopy and colonoscopy both done at the beginning of this year presents with 2 years of chronic right lower quadrant abdominal pain No real associated symptoms with that Uncertain if this is GI related, musculoskeletal, gynecological however with patient's history of Lynch syndrome will get cross-sectional CT abdomen pelvis with contrast to evaluate further Will check for iron deficiency anemia to see if patient may need capsule endoscopy to rule out small bowel malignancy Encourage follow-up with GYN Linzess samples 72 mcg for possible IBS constipation given Do trial of Salonpas patches see if this is more nerve/musculoskeletal, good ROM right hip.    Patient Care Team: Corwin Levins, MD as PCP - General  HISTORY OF PRESENT ILLNESS: 57 y.o. female with a past medical history of anxiety, GERD with hiatal hernia, constipation, PVCs, sickle cell trait, Lynch syndrome, s/p partial hysterectomy and others listed below presents for evaluation of left-sided abdominal pain.    2016 negative CT abdomen pelvis, negative abdominal ultrasound, normal HIDA 12/2019 endoscopy 3 cm hiatal hernia negative exam, gastric biopsy showed benign gastric mucosa no H. pylori 3-year follow-up 06/2021 colonoscopy one 3 mm polyp descending colon benign hyperplastic 08/03/2022 colonoscopy with good prep entire colon was normal, no specimens collected.  Recall 1 year 07/2023 08/03/2022 EGD for right upper quadrant abdominal pain/heartburn normal esophagus, 2 cm hiatal hernia,  nonbleeding gastric ulcer no stigmata of bleeding normal duodenum.  Biopsies negative for H. pylori or precancerous changes.  Recall EGD 3 years ( 2027)  She has had AB pain x 2 years, RLQ AB pain.  Intermittently, can be daily or  every other day, will last for hours to a whole day.  It is nagging, dull ache, can be sharp at time, rare right flank.  Worse pushing, bending. Lying down is better.  Has mild nausea with the discomfort, no vomiting.  No associated change in Bm's, no fever, chills, no flushing.  No weight loss.  No urinary issues, had recent negative urine and saw GYN.  Is scheduled for pelvic US with GYN on the 15th.  Tylenol can help some.  She has BM every 3 days which is normal for her, no melena, no hematochezia. Has some bloating occ.   She denies blood thinner use.  She denies NSAID use.  She reports ETOH use, social 2 x a month.   She denies tobacco use.  She denies drug use.    She  reports that she has never smoked. She has never used smokeless tobacco. She reports current alcohol use. She reports that she does not use drugs.  RELEVANT LABS AND IMAGING: CBC    Component Value Date/Time   WBC 3.8 (L) 07/28/2022 1400   RBC 5.05 07/28/2022 1400   HGB 13.9 07/28/2022 1400   HCT 41.5 07/28/2022 1400   PLT 292 07/28/2022 1400   MCV 82.2 07/28/2022 1400   MCH 27.5 07/28/2022 1400   MCHC 33.5 07/28/2022 1400   RDW 13.5 07/28/2022 1400   LYMPHSABS 1.5 06/16/2022 1022   MONOABS 0.3 06/16/2022 1022   EOSABS 0.2 06/16/2022 1022   BASOSABS 0.0 06/16/2022 1022   Recent Labs    02/04/22 1558 06/16/22 1022 07/28/22 1400  HGB 15.0 14.5 13.9    CMP     Component Value Date/Time   NA  138 07/28/2022 1400   K 3.6 07/28/2022 1400   CL 103 07/28/2022 1400   CO2 25 07/28/2022 1400   GLUCOSE 91 07/28/2022 1400   BUN 9 07/28/2022 1400   CREATININE 0.98 07/28/2022 1400   CALCIUM 9.5 07/28/2022 1400   PROT 7.8 06/16/2022 1022   ALBUMIN 4.7 06/16/2022 1022   AST 23 06/16/2022 1022   ALT 18 06/16/2022 1022   ALKPHOS 60 06/16/2022 1022   BILITOT 0.4 06/16/2022 1022   GFRNONAA >60 07/28/2022 1400   GFRAA >60 07/24/2018 1116      Latest Ref Rng & Units 06/16/2022   10:22 AM 02/04/2022     3:58 PM 05/05/2021    4:56 PM  Hepatic Function  Total Protein 6.0 - 8.3 g/dL 7.8  7.8  7.8   Albumin 3.5 - 5.2 g/dL 4.7  4.8  4.8   AST 0 - 37 U/L 23  21  22    ALT 0 - 35 U/L 18  15  19    Alk Phosphatase 39 - 117 U/L 60  64  56   Total Bilirubin 0.2 - 1.2 mg/dL 0.4  0.5  0.3   Bilirubin, Direct 0.0 - 0.3 mg/dL 0.1   0.0       Current Medications:             Current Outpatient Medications (Other):    fluocinonide cream (LIDEX) 0.05 %, Apply 1 Application topically as needed.   linaclotide (LINZESS) 72 MCG capsule, Take 1 capsule (72 mcg total) by mouth daily before breakfast.   metroNIDAZOLE (FLAGYL) 500 MG tablet, Take 500 mg by mouth 2 (two) times daily.   pantoprazole (PROTONIX) 40 MG tablet, Begin pantoprazole 40 mg twice daily before 1st and last meal of the day X 4 weeks, then once daily X 4-8 weeks   PARoxetine (PAXIL) 10 MG tablet, Take 10 mg by mouth daily.  Current Facility-Administered Medications (Other):    0.9 %  sodium chloride infusion  Medical History:  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   DIZZINESS 08/15/2008   DYSPNEA 05/20/2008   FATIGUE 05/20/2008   History of 2019 novel coronavirus disease (COVID-19) 05/23/2019   History of hiatal hernia    HSV infection    Hyperplastic colon polyp    KNEE PAIN, LEFT 09/11/2007   Left sided sciatica 04/19/2011   Lumbar herniated disc    Lynch syndrome    MIGRAINE HEADACHE 04/09/2007   Ovarian cyst    Palpitations 05/20/2008   Pancreatic lesion    PONV (postoperative nausea and vomiting)    PREMATURE VENTRICULAR  CONTRACTIONS 08/23/2008   RASH-NONVESICULAR 01/17/2008   RUQ PAIN 02/23/2010   SICKLE CELL TRAIT 04/09/2007   Tension headache 04/09/2007   VARICOSE VEINS LOWER EXTREMITIES W/OTH COMPS 09/11/2007   Wears glasses    Allergies:  Allergies  Allergen Reactions   Morphine And Related Rash     Surgical History:  She  has a past surgical history that includes Tonsillectomy (07/18/2009); Cesarean section (2004); Tubal ligation (01/06/2012); Laparoscopic assisted vaginal hysterectomy (N/A, 10/23/2012); Finger fracture surgery (Left, 03/2019); Colonoscopy (  2016); and Upper gastrointestinal endoscopy. Family History:  Her family history includes Brain cancer in her maternal aunt; Cancer in her cousin and mother; Heart attack in her father; Heart disease in her mother; Hypertension in her mother; Liver cancer in her maternal aunt; Lung cancer in her maternal aunt; Pancreatitis in her father.  REVIEW OF SYSTEMS  : All other systems reviewed and negative except where noted in the History of Present Illness.  PHYSICAL EXAM: BP 110/78 (BP Location: Left Arm, Patient Position: Sitting, Cuff Size: Normal)   Pulse 88   Ht 5' 4.75" (1.645 m)   Wt 184 lb 6 oz (83.6 kg)   LMP 10/05/2012   BMI 30.92 kg/m  General Appearance: Well nourished, in no apparent distress. Head:   Normocephalic and atraumatic. Eyes:  sclerae anicteric,conjunctive pink  Respiratory: Respiratory effort normal, BS equal bilaterally without rales, rhonchi, wheezing. Cardio: RRR with no MRGs. Peripheral pulses intact.  Abdomen: Soft,  Flat ,active bowel sounds. mild tenderness in the RLQ. Without guarding and Without rebound. No masses. Rectal: Not evaluated Musculoskeletal: Full ROM of right hip, no pain on rotation, Normal gait. Without edema. Skin:  Dry and intact without significant lesions or rashes Neuro: Alert and  oriented x4;  No focal deficits. Psych:  Cooperative. Normal mood and affect.    Doree Albee,  PA-C 12:50 PM

## 2022-11-19 ENCOUNTER — Ambulatory Visit: Payer: Managed Care, Other (non HMO) | Admitting: Physician Assistant

## 2022-11-19 ENCOUNTER — Encounter: Payer: Self-pay | Admitting: Physician Assistant

## 2022-11-19 ENCOUNTER — Other Ambulatory Visit (INDEPENDENT_AMBULATORY_CARE_PROVIDER_SITE_OTHER): Payer: Managed Care, Other (non HMO)

## 2022-11-19 VITALS — BP 110/78 | HR 88 | Ht 64.75 in | Wt 184.4 lb

## 2022-11-19 DIAGNOSIS — R1031 Right lower quadrant pain: Secondary | ICD-10-CM

## 2022-11-19 DIAGNOSIS — G8929 Other chronic pain: Secondary | ICD-10-CM | POA: Diagnosis not present

## 2022-11-19 DIAGNOSIS — K5904 Chronic idiopathic constipation: Secondary | ICD-10-CM

## 2022-11-19 DIAGNOSIS — Z1509 Genetic susceptibility to other malignant neoplasm: Secondary | ICD-10-CM | POA: Diagnosis not present

## 2022-11-19 LAB — CBC WITH DIFFERENTIAL/PLATELET
Basophils Absolute: 0 10*3/uL (ref 0.0–0.1)
Basophils Relative: 1.1 % (ref 0.0–3.0)
Eosinophils Absolute: 0.1 10*3/uL (ref 0.0–0.7)
Eosinophils Relative: 1.4 % (ref 0.0–5.0)
HCT: 41.6 % (ref 36.0–46.0)
Hemoglobin: 13.7 g/dL (ref 12.0–15.0)
Lymphocytes Relative: 38.8 % (ref 12.0–46.0)
Lymphs Abs: 1.5 10*3/uL (ref 0.7–4.0)
MCHC: 33 g/dL (ref 30.0–36.0)
MCV: 87.1 fl (ref 78.0–100.0)
Monocytes Absolute: 0.4 10*3/uL (ref 0.1–1.0)
Monocytes Relative: 10.1 % (ref 3.0–12.0)
Neutro Abs: 1.9 10*3/uL (ref 1.4–7.7)
Neutrophils Relative %: 48.6 % (ref 43.0–77.0)
Platelets: 213 10*3/uL (ref 150.0–400.0)
RBC: 4.78 Mil/uL (ref 3.87–5.11)
RDW: 14.5 % (ref 11.5–15.5)
WBC: 3.9 10*3/uL — ABNORMAL LOW (ref 4.0–10.5)

## 2022-11-19 MED ORDER — LINACLOTIDE 72 MCG PO CAPS: 72.0000 ug | ORAL_CAPSULE | Freq: Every day | ORAL | 3 refills | Status: DC

## 2022-11-19 NOTE — Patient Instructions (Addendum)
Your provider has requested that you go to the basement level for lab work before leaving today. Press "B" on the elevator. The lab is located at the first door on the left as you exit the elevator.  We are looking for a low iron. If you have a low iron, we will schedule you for a capsule study.   Can do trial of IBGard which is over the counter for abdominal pain- Take 1-2 capsules once a day for maintence or twice a day during a flare  Try salon pas patches over the counter.  You will be contacted by [facility] Scheduling in the next 2 days to arrange a CT Scan.  The number on your caller ID will be [phone], please answer when they call.  If you have not heard from them in 2 days please call [phone] to schedule.   --------------------------------------------------------------- Possible IBS constipation see below, try the linzess.   Linzess  72 mcg *IBS-C patients may begin to experience relief from belly pain and overall abdominal symptoms (pain, discomfort, and bloating) in about 1 week,  with symptoms typically improving over 12 weeks.  Take at least 30 minutes before the first meal of the day on an empty stomach You can have a loose stool if you eat a high-fat breakfast. Give it at least 7 days, may have more bowel movements during that time.  The diarrhea should go away and you should start having normal, complete, full bowel movements.  It may be helpful to start treatment when you can be near the comfort of your own bathroom, such as a weekend.  After you are out we can send in a prescription if you did well, there is a prescription card  First do a trial off milk/lactose products if you use them.  Add fiber like benefiber or citracel once a day Increase activity  Please try to decrease stress. consider talking with PCP about anti anxiety medication or try head space app for meditation. if any worsening symptoms like blood in stool, weight loss, please call the  office   Please try low FODMAP diet- see below- start with eliminating just one column at a time, the table at the very bottom contains foods that are safe to take   FODMAP stands for fermentable oligo-, di-, mono-saccharides and polyols (1). These are the scientific terms used to classify groups of carbs that are notorious for triggering digestive symptoms like bloating, gas and stomach pain.

## 2022-11-22 LAB — IBC + FERRITIN
Ferritin: 53.7 ng/mL (ref 10.0–291.0)
Iron: 122 ug/dL (ref 42–145)
Saturation Ratios: 36.8 % (ref 20.0–50.0)
TIBC: 331.8 ug/dL (ref 250.0–450.0)
Transferrin: 237 mg/dL (ref 212.0–360.0)

## 2022-11-25 ENCOUNTER — Emergency Department (HOSPITAL_BASED_OUTPATIENT_CLINIC_OR_DEPARTMENT_OTHER): Payer: Managed Care, Other (non HMO)

## 2022-11-25 ENCOUNTER — Emergency Department (HOSPITAL_BASED_OUTPATIENT_CLINIC_OR_DEPARTMENT_OTHER)
Admission: EM | Admit: 2022-11-25 | Discharge: 2022-11-25 | Disposition: A | Discharge status: Home / Self Care | Payer: Managed Care, Other (non HMO) | Attending: Emergency Medicine | Admitting: Emergency Medicine

## 2022-11-25 ENCOUNTER — Other Ambulatory Visit: Payer: Self-pay

## 2022-11-25 ENCOUNTER — Encounter (HOSPITAL_BASED_OUTPATIENT_CLINIC_OR_DEPARTMENT_OTHER): Payer: Self-pay

## 2022-11-25 DIAGNOSIS — R42 Dizziness and giddiness: Secondary | ICD-10-CM | POA: Diagnosis present

## 2022-11-25 DIAGNOSIS — Z1152 Encounter for screening for COVID-19: Secondary | ICD-10-CM | POA: Insufficient documentation

## 2022-11-25 LAB — BASIC METABOLIC PANEL
Anion gap: 11 (ref 5–15)
BUN: 14 mg/dL (ref 6–20)
CO2: 23 mmol/L (ref 22–32)
Calcium: 9.4 mg/dL (ref 8.9–10.3)
Chloride: 106 mmol/L (ref 98–111)
Creatinine, Ser: 1.13 mg/dL — ABNORMAL HIGH (ref 0.44–1.00)
GFR, Estimated: 57 mL/min — ABNORMAL LOW (ref >60)
Glucose, Bld: 95 mg/dL (ref 70–99)
Potassium: 3.4 mmol/L — ABNORMAL LOW (ref 3.5–5.1)
Sodium: 140 mmol/L (ref 135–145)

## 2022-11-25 LAB — TROPONIN I (HIGH SENSITIVITY)
Troponin I (High Sensitivity): 2 ng/L (ref <18)
Troponin I (High Sensitivity): 2 ng/L (ref <18)

## 2022-11-25 LAB — CBC WITH DIFFERENTIAL/PLATELET
Abs Immature Granulocytes: 0.02 10*3/uL (ref 0.00–0.07)
Basophils Absolute: 0 10*3/uL (ref 0.0–0.1)
Basophils Relative: 0 %
Eosinophils Absolute: 0 10*3/uL (ref 0.0–0.5)
Eosinophils Relative: 1 %
HCT: 39.4 % (ref 36.0–46.0)
Hemoglobin: 13.4 g/dL (ref 12.0–15.0)
Immature Granulocytes: 0 %
Lymphocytes Relative: 37 %
Lymphs Abs: 1.9 10*3/uL (ref 0.7–4.0)
MCH: 28.5 pg (ref 26.0–34.0)
MCHC: 34 g/dL (ref 30.0–36.0)
MCV: 83.8 fL (ref 80.0–100.0)
Monocytes Absolute: 0.6 10*3/uL (ref 0.1–1.0)
Monocytes Relative: 11 %
Neutro Abs: 2.7 10*3/uL (ref 1.7–7.7)
Neutrophils Relative %: 51 %
Platelets: 220 10*3/uL (ref 150–400)
RBC: 4.7 MIL/uL (ref 3.87–5.11)
RDW: 14.5 % (ref 11.5–15.5)
WBC: 5.3 10*3/uL (ref 4.0–10.5)
nRBC: 0 % (ref 0.0–0.2)

## 2022-11-25 LAB — RESP PANEL BY RT-PCR (RSV, FLU A&B, COVID)  RVPGX2
Influenza A by PCR: NEGATIVE
Influenza B by PCR: NEGATIVE
Resp Syncytial Virus by PCR: NEGATIVE
SARS Coronavirus 2 by RT PCR: NEGATIVE

## 2022-11-25 MED ORDER — ACETAMINOPHEN 500 MG PO TABS
1000.0000 mg | ORAL_TABLET | Freq: Once | ORAL | Status: AC
  Administered 2022-11-25: 1000 mg via ORAL
  Filled 2022-11-25: qty 2

## 2022-11-25 MED ORDER — ALUM & MAG HYDROXIDE-SIMETH 200-200-20 MG/5ML PO SUSP
30.0000 mL | Freq: Once | ORAL | Status: AC
  Administered 2022-11-25: 30 mL via ORAL
  Filled 2022-11-25: qty 30

## 2022-11-25 MED ORDER — POTASSIUM CHLORIDE CRYS ER 20 MEQ PO TBCR
40.0000 meq | EXTENDED_RELEASE_TABLET | Freq: Once | ORAL | Status: AC
  Administered 2022-11-25: 40 meq via ORAL
  Filled 2022-11-25: qty 2

## 2022-11-25 NOTE — ED Triage Notes (Signed)
Complaining of being dizzy this am when she woke up to get into the shower. A&O x 4 denies any other symptoms.

## 2022-11-25 NOTE — ED Provider Notes (Signed)
Care transferred to me.  Second troponin is normal and unchanged.  Labs are overall reassuring besides mild hypokalemia.  Patient feels well.  Vitals are normal.  Will discharge home to follow-up with PCP.  Will give return precautions.   Pricilla Loveless, MD 11/25/22 [phone]

## 2022-11-25 NOTE — ED Notes (Signed)
Reviewed discharge instructions and follow up with pt. Pt states understanding. Reports dizziness has decreased
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
## 2022-11-25 NOTE — Discharge Instructions (Signed)
If you develop new or worsening lightheadedness, chest pain, trouble breathing, headache, or any other new/concerning symptoms then return to the ER or call 911.

## 2022-11-25 NOTE — ED Notes (Signed)
Assisted pt to bathroom. Upon walking outside room pt became very dizzy and had to be wheeled into bathroom and then back to bed.

## 2022-11-26 ENCOUNTER — Telehealth: Payer: Self-pay

## 2022-11-26 NOTE — Transitions of Care (Post Inpatient/ED Visit) (Signed)
   11/26/2022  Name: [name] MRN: [id number] DOB: 1966-02-29  Today's TOC FU Call Status: Today's TOC FU Call Status:: Successful TOC FU Call Competed Unsuccessful Call (1st Attempt) Date: 11/26/22 [facility] FU Call Complete Date: 11/26/22  Transition Care Management Follow-up Telephone Call Date of Discharge: 11/25/22 Discharge Facility: [facility] Type of Discharge: Emergency Department Reason for ED Visit: Other: (dizziness) How have you been since you were released from the hospital?: Better Any questions or concerns?: No  Items Reviewed: Did you receive and understand the discharge instructions provided?: Yes Medications obtained,verified, and reconciled?: Yes (Medications Reviewed) Any new allergies since your discharge?: No Dietary orders reviewed?: Yes Do you have support at home?: Yes People in Home: spouse  Medications Reviewed Today: Medications Reviewed Today     Reviewed by Karena Addison, LPN (Licensed Practical Nurse) on 11/26/22 at 1510  Med List Status: <None>   Medication Order Taking? Sig Documenting Provider Last Dose Status Informant  0.9 %  sodium chloride infusion [id number]   Pyrtle, Carie Caddy, MD  Active   fluocinonide cream (LIDEX) 0.05 % [id number] Yes Apply 1 Application topically as needed. [provider] Taking Active   linaclotide Karlene Einstein) 72 MCG capsule [id number] Yes Take 1 capsule (72 mcg total) by mouth daily before breakfast. Doree Albee, PA-C Taking Active   metroNIDAZOLE (FLAGYL) 500 MG tablet [id number] Yes Take 500 mg by mouth 2 (two) times daily. [provider] Taking Active   pantoprazole (PROTONIX) 40 MG tablet [id number] Yes Begin pantoprazole 40 mg twice daily before 1st and last meal of the day X 4 weeks, then once daily X 4-8 weeks Pyrtle, Carie Caddy, MD Taking Active   PARoxetine (PAXIL) 10 MG tablet [id number] Yes Take 10 mg by mouth daily. [provider] Taking Active             Home Care  and Equipment/Supplies: Were Home Health Services Ordered?: NA Any new equipment or medical supplies ordered?: NA  Functional Questionnaire: Do you need assistance with bathing/showering or dressing?: No Do you need assistance with meal preparation?: No Do you need assistance with eating?: No Do you have difficulty maintaining continence: No Do you need assistance with getting out of bed/getting out of a chair/moving?: No Do you have difficulty managing or taking your medications?: No  Follow up appointments reviewed: PCP Follow-up appointment confirmed?: NA Specialist Hospital Follow-up appointment confirmed?: NA Do you need transportation to your follow-up appointment?: No Do you understand care options if your condition(s) worsen?: Yes-patient verbalized understanding    SIGNATURE Karena Addison, LPN [facility] Nurse Health Advisor Direct Dial [phone]

## 2022-11-26 NOTE — Transitions of Care (Post Inpatient/ED Visit) (Signed)
   11/26/2022  Name: [name] MRN: [id number] DOB: [birth year]  Today's TOC FU Call Status: Today's TOC FU Call Status:: Unsuccessul Call (1st Attempt) Unsuccessful Call (1st Attempt) Date: 11/26/22  Attempted to reach the patient regarding the most recent Inpatient/ED visit.  Follow Up Plan: Additional outreach attempts will be made to reach the patient to complete the Transitions of Care (Post Inpatient/ED visit) call.   Signature Karena Addison, LPN [facility] Nurse Health Advisor Direct Dial [phone]

## 2022-11-26 NOTE — Progress Notes (Signed)
Addendum: Reviewed and agree with assessment and management plan. Jakeisha Stricker M, MD  

## 2022-12-07 ENCOUNTER — Telehealth: Payer: Self-pay

## 2022-12-07 NOTE — Telephone Encounter (Unsigned)
Message Received: Dorinda Hill, April D  Chnupa, Perlie Gold, RN [place], Can you let the pt know her ct scan is approved at:  [facility] [address] [place], [place], [address] Phone:[phone]  Her insurance would not approve anything with [facility]. Thank you.

## 2022-12-07 NOTE — Telephone Encounter (Unsigned)
Unable to reach pt. Pt phone rings and rings with no voice mail.

## 2022-12-08 NOTE — Telephone Encounter (Signed)
Pt was notified that her Insurance would not approve her CT scan to be done through [facility] and that it is to be done at [facility].  Phone number provided for pt.  Pt verbalized understanding with all questions answered.

## 2022-12-16 ENCOUNTER — Ambulatory Visit (HOSPITAL_COMMUNITY): Payer: Managed Care, Other (non HMO)

## 2022-12-16 ENCOUNTER — Ambulatory Visit
Admission: RE | Admit: 2022-12-16 | Discharge: 2022-12-16 | Disposition: A | Discharge status: Home / Self Care | Payer: Managed Care, Other (non HMO) | Source: Ambulatory Visit | Attending: Physician Assistant | Admitting: Physician Assistant

## 2022-12-16 DIAGNOSIS — R1031 Right lower quadrant pain: Secondary | ICD-10-CM

## 2022-12-16 MED ORDER — IOPAMIDOL (ISOVUE-300) INJECTION 61%
100.0000 mL | Freq: Once | INTRAVENOUS | Status: AC | PRN
  Administered 2022-12-16: 100 mL via INTRAVENOUS

## 2022-12-20 NOTE — Telephone Encounter (Signed)
Spoke with pt and she is aware of results per Quentin Mulling PA. Pt states since her scan she has been feeling dizzy. Discussed with her she should contact her PCP office regarding those symptoms.

## 2022-12-22 ENCOUNTER — Ambulatory Visit: Payer: Managed Care, Other (non HMO) | Admitting: Emergency Medicine

## 2022-12-22 ENCOUNTER — Encounter: Payer: Self-pay | Admitting: Emergency Medicine

## 2022-12-22 VITALS — BP 116/80 | HR 80 | Temp 97.6°F | Ht 64.0 in | Wt 177.5 lb

## 2022-12-22 DIAGNOSIS — R42 Dizziness and giddiness: Secondary | ICD-10-CM

## 2022-12-22 LAB — CBC WITH DIFFERENTIAL/PLATELET
Basophils Absolute: 0 10*3/uL (ref 0.0–0.1)
Basophils Relative: 0.7 % (ref 0.0–3.0)
Eosinophils Absolute: 0 10*3/uL (ref 0.0–0.7)
Eosinophils Relative: 0.5 % (ref 0.0–5.0)
HCT: 46.2 % — ABNORMAL HIGH (ref 36.0–46.0)
Hemoglobin: 14.9 g/dL (ref 12.0–15.0)
Lymphocytes Relative: 27.7 % (ref 12.0–46.0)
Lymphs Abs: 1.4 10*3/uL (ref 0.7–4.0)
MCHC: 32.3 g/dL (ref 30.0–36.0)
MCV: 87.3 fl (ref 78.0–100.0)
Monocytes Absolute: 0.3 10*3/uL (ref 0.1–1.0)
Monocytes Relative: 6.9 % (ref 3.0–12.0)
Neutro Abs: 3.2 10*3/uL (ref 1.4–7.7)
Neutrophils Relative %: 64.2 % (ref 43.0–77.0)
Platelets: 264 10*3/uL (ref 150.0–400.0)
RBC: 5.29 Mil/uL — ABNORMAL HIGH (ref 3.87–5.11)
RDW: 13.9 % (ref 11.5–15.5)
WBC: 4.9 10*3/uL (ref 4.0–10.5)

## 2022-12-22 LAB — HEMOGLOBIN A1C: Hgb A1c MFr Bld: 5.4 % (ref 4.6–6.5)

## 2022-12-22 LAB — URINALYSIS
Bilirubin Urine: NEGATIVE
Leukocytes,Ua: NEGATIVE
Nitrite: NEGATIVE
Specific Gravity, Urine: <=1.005 — AB (ref 1.000–1.030)
Total Protein, Urine: NEGATIVE
Urine Glucose: NEGATIVE
Urobilinogen, UA: 0.2 (ref 0.0–1.0)
pH: 6 (ref 5.0–8.0)

## 2022-12-22 LAB — VITAMIN B12: Vitamin B-12: 441 pg/mL (ref 211–911)

## 2022-12-22 LAB — VITAMIN D 25 HYDROXY (VIT D DEFICIENCY, FRACTURES): VITD: 47.12 ng/mL (ref 30.00–100.00)

## 2022-12-22 NOTE — Progress Notes (Signed)
[name] 57 y.o.   Chief Complaint  Patient presents with   Follow-up    Patient was seen in ED on 5/9, lightheadedness, feels as though she is going to pass out, dry mouth, vision is blurry     HISTORY OF PRESENT ILLNESS: This is a 57 y.o. female here for follow-up of emergency department visit on 11/25/2022 when she presented feeling lightheaded and near syncopal. Workup was negative and she was sent home. Today feeling like she is going to pass out.  Has dry mouth.  Vision is blurry. Denies anxiety, stress, depression.  Non-smoker. Denies syncope or any other associated symptoms. History of migraine headaches.  No new medications.  No new diet or new over-the-counter supplements. Denies flulike symptoms.  Denies fever or chills.  HPI   Prior to Admission medications   Medication Sig Start Date End Date Taking? Authorizing Provider  fluocinonide cream (LIDEX) 0.05 % Apply 1 Application topically as needed. Patient not taking: Reported on 12/22/2022 11/18/22   [provider]  linaclotide Karlene Einstein) 72 MCG capsule Take 1 capsule (72 mcg total) by mouth daily before breakfast. Patient not taking: Reported on 12/22/2022 11/19/22 11/14/23  Doree Albee, PA-C  pantoprazole (PROTONIX) 40 MG tablet Begin pantoprazole 40 mg twice daily before 1st and last meal of the day X 4 weeks, then once daily X 4-8 weeks Patient not taking: Reported on 12/22/2022 08/03/22   Beverley Fiedler, MD  PARoxetine (PAXIL) 10 MG tablet Take 10 mg by mouth daily. 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   Irritant dermatitis 12/03/2021   Neoplasm of uncertain behavior of skin 12/03/2021   PHN (postherpetic neuralgia) 07/05/2021   Eustachian tube dysfunction, bilateral 05/10/2021   Vitamin D deficiency 05/10/2021   Lynch syndrome 05/01/2021   Acute pharyngitis 03/22/2021   Insomnia 12/02/2020   Venous (peripheral) insufficiency 12/18/2017   Adenomyosis 10/24/2012   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   PREMATURE VENTRICULAR CONTRACTIONS 08/23/2008   DIZZINESS 08/15/2008   FATIGUE 05/20/2008   Palpitations 05/20/2008   TONSILLITIS, CHRONIC 03/13/2008   DEGENERATIVE DISC DISEASE, 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   SICKLE CELL TRAIT 04/09/2007   MIGRAINE HEADACHE 04/09/2007   DEEP VENOUS THROMBOPHLEBITIS, LEFT, LEG, HX OF 04/09/2007    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   DIZZINESS 08/15/2008   DYSPNEA 05/20/2008   FATIGUE 05/20/2008   History of 2019 novel coronavirus disease (COVID-19) 05/23/2019   History of hiatal hernia    HSV infection    Hyperplastic colon polyp    KNEE PAIN, LEFT 09/11/2007  Left sided sciatica 04/19/2011   Lumbar herniated disc    Lynch syndrome    MIGRAINE HEADACHE 04/09/2007   Ovarian cyst    Palpitations 05/20/2008   Pancreatic lesion    PONV (postoperative nausea and vomiting)    PREMATURE VENTRICULAR CONTRACTIONS 08/23/2008   RASH-NONVESICULAR 01/17/2008   RUQ PAIN 02/23/2010   SICKLE CELL TRAIT 04/09/2007   Tension headache 04/09/2007   VARICOSE VEINS LOWER EXTREMITIES W/OTH COMPS 09/11/2007    Wears glasses     Past Surgical History:  Procedure Laterality Date   CESAREAN SECTION  2004   COLONOSCOPY  2016   FINGER FRACTURE SURGERY Left 03/2019   LAPAROSCOPIC ASSISTED VAGINAL HYSTERECTOMY N/A 10/23/2012   Procedure: LAPAROSCOPIC ASSISTED VAGINAL HYSTERECTOMY;  Surgeon: Meriel Pica, MD;  Location: [facility];  Service: Gynecology;  Laterality: N/A;   TONSILLECTOMY  07/18/2009   TUBAL LIGATION  01/06/2012   with cyst removal   UPPER GASTROINTESTINAL ENDOSCOPY      Social History   Socioeconomic History   Marital status: Married    Spouse name: Not on file   Number of children: 3   Years of education: Not on file   Highest education level: Some college, no degree  Occupational History   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     Employer: [employer]   Occupation: currently does not work outside the home (12/06/19)  Tobacco Use   Smoking status: Never   Smokeless tobacco: Never  Vaping Use   Vaping Use: Never used  Substance and Sexual Activity   Alcohol use: Yes    Comment: socially rare every 1-2 months   Drug use: No   Sexual activity: Not on file  Other Topics Concern   Not on file  Social History Narrative   Not on file   Social Determinants of Health   Financial Resource Strain: Low Risk  (10/25/2022)   Overall Financial Resource Strain (CARDIA)    Difficulty of Paying Living Expenses: Not hard at all  Food Insecurity: No Food Insecurity (10/25/2022)   Hunger Vital Sign    Worried About Running Out of Food in the Last Year: Never true    Ran Out of Food in the Last Year: Never true  Transportation Needs: No Transportation Needs (10/25/2022)   PRAPARE - Administrator, Civil Service (Medical): No    Lack of Transportation (Non-Medical): No  Physical Activity: Insufficiently Active (10/25/2022)   Exercise Vital Sign    Days of Exercise per Week: 2 days    Minutes of Exercise per Session: 30 min  Stress: No Stress Concern Present  (10/25/2022)   [employer] of Occupational Health - Occupational Stress Questionnaire    Feeling of Stress : Not at all  Social Connections: Moderately Integrated (10/25/2022)   Social Connection and Isolation Panel [NHANES]    Frequency of Communication with Friends and Family: More than three times a week    Frequency of Social Gatherings with Friends and Family: Three times a week    Attends Religious Services: 1 to 4 times per year    Active Member of Clubs or Organizations: No    Attends Engineer, structural: Not on file    Marital Status: Married  Catering manager Violence: Not on file    Family History  Problem Relation Age of Onset   Heart disease Mother    Hypertension Mother    Cancer Mother        ovarian  Heart attack Father    Pancreatitis Father    Liver cancer Maternal Aunt    Brain cancer Maternal Aunt    Lung cancer Maternal Aunt    Cancer Cousin        breast   Colon cancer Neg Hx    Colon polyps Neg Hx    Esophageal cancer Neg Hx    Rectal cancer Neg Hx    Stomach cancer Neg Hx      Review of Systems  Constitutional: Negative.  Negative for chills and fever.  HENT: Negative.  Negative for congestion and sore throat.   Eyes:  Positive for blurred vision.  Respiratory: Negative.  Negative for cough and shortness of breath.   Cardiovascular: Negative.  Negative for chest pain and palpitations.  Gastrointestinal:  Negative for abdominal pain, diarrhea, nausea and vomiting.  Genitourinary: Negative.  Negative for dysuria and hematuria.  Skin: Negative.  Negative for rash.  Neurological:  Positive for weakness and headaches. Negative for dizziness and focal weakness.    Vitals:   12/22/22 1315  BP: 116/80  Pulse: 80  Temp: 97.6 F (36.4 C)  SpO2: 98%    Physical Exam Vitals reviewed.  Constitutional:      Appearance: Normal appearance.  HENT:     Head: Normocephalic.     Right Ear: Tympanic membrane, ear canal and external ear  normal.     Left Ear: Tympanic membrane, ear canal and external ear normal.     Mouth/Throat:     Mouth: Mucous membranes are moist.     Pharynx: Oropharynx is clear.  Eyes:     Extraocular Movements: Extraocular movements intact.     Conjunctiva/sclera: Conjunctivae normal.     Pupils: Pupils are equal, round, and reactive to light.  Neck:     Vascular: No carotid bruit.  Cardiovascular:     Rate and Rhythm: Normal rate and regular rhythm.     Pulses: Normal pulses.     Heart sounds: Normal heart sounds.  Pulmonary:     Effort: Pulmonary effort is normal.     Breath sounds: Normal breath sounds.  Abdominal:     Palpations: Abdomen is soft.     Tenderness: There is no abdominal tenderness.  Musculoskeletal:     Cervical back: No tenderness.     Right lower leg: No edema.     Left lower leg: No edema.  Lymphadenopathy:     Cervical: No cervical adenopathy.  Skin:    General: Skin is warm and dry.     Capillary Refill: Capillary refill takes less than 2 seconds.  Neurological:     General: No focal deficit present.     Mental Status: She is alert and oriented to person, place, and time.     Cranial Nerves: No cranial nerve deficit.     Sensory: No sensory deficit.     Motor: No weakness.     Coordination: Coordination normal.     Gait: Gait normal.     Deep Tendon Reflexes: Reflexes normal.  Psychiatric:        Mood and Affect: Mood normal.        Behavior: Behavior normal.    EKG: Normal sinus rhythm with ventricular rate of 76/min.  No acute ischemic changes.  Normal EKG.  ASSESSMENT & PLAN: A total of 47 minutes was spent with the patient and counseling/coordination of care regarding preparing for this visit, review of available medical records, review of most recent emergency department visit  notes, review of most recent CT scan of abdomen pelvis and head, review of most recent blood work results, differential diagnosis of lightheadedness and need for continued  workup, prognosis, documentation, and need for follow-up.  Problem List Items Addressed This Visit       Other   Lightheadedness - Primary    Clinically stable.  No red flag signs or symptoms. Differential diagnosis discussed.  No neurological deficits detected Recent CT scan of brain normal.  Recent CT scan of abdomen and pelvis negative.  No signs of stroke.  No carotid bruits. Normal EKG.  Recommend repeat blood work and urinalysis today Suspect hyperventilation syndrome. ED precautions given. Recommend to also follow-up with PCP.      Relevant Orders   Urinalysis   CBC with Differential/Platelet   Comprehensive metabolic panel   Hemoglobin A1c   Lipid panel   VITAMIN D 25 Hydroxy (Vit-D Deficiency, Fractures)   Vitamin B12   Thyroid Panel With TSH   EKG 12-Lead   Patient Instructions  Near-Syncope Near-syncope is when you suddenly feel like you might pass out or faint. This may also be called presyncope. During an episode of near-syncope, you may: Feel dizzy, weak, or light-headed. It may feel like the room is spinning. Feel like you may vomit (nauseous). See spots or see all white or all black. Have cold, clammy skin. Feel warm and sweaty. Hear ringing in your ears. This condition is caused by a sudden decrease in blood flow to the brain. This can result from many causes, but most of those causes are not dangerous. However, near-syncope may be a sign of a serious medical problem, so it is important to seek medical care. Follow these instructions at home: Medicines Take over-the-counter and prescription medicines only as told by your doctor. If you are taking blood pressure or heart medicine, get up slowly and spend many minutes getting ready to sit and then stand. This can help with dizziness. Lifestyle Do not drive, use machinery, or play sports until your doctor says it is okay. Do not drink alcohol. Do not smoke or use any products that contain nicotine or tobacco.  If you need help quitting, ask your doctor. Avoid hot tubs and saunas. General instructions Be aware of any changes in your symptoms. Talk with your doctor about your symptoms. You may need to have testing to help find the cause. If you start to feel like you might pass out, sit or lie down right away. If sitting, lower your head down between your legs. If lying down, raise (elevate) your feet above the level of your heart. Breathe deeply and steadily. Wait until all of the symptoms are gone. Have someone stay with you until you feel better. Drink enough fluid to keep your pee (urine) pale yellow. Avoid standing for a long time. If you must stand for a long time, do movements such as: Moving your legs. Crossing your legs. Flexing and stretching your leg muscles. Squatting. Keep all follow-up visits. Contact a doctor if: You continue to have episodes of near fainting. Get help right away if: You pass out or faint. You have any of these symptoms: Fast or uneven heartbeats (palpitations). Pain in your chest, belly, or back. Shortness of breath. You have a seizure. You have a very bad headache. You are confused. You have trouble seeing. You are very weak. You have trouble walking. You are bleeding from your mouth or butt. You have black or tarry poop (stool). These symptoms may  be an emergency. Get help right away. Call your local emergency services (911 in the [place] Do not wait to see if the symptoms will go away. Do not drive yourself to the hospital. Summary Near-syncope is when you suddenly feel like you might pass out or faint. This condition is caused by a sudden decrease in blood flow to the brain. Near-syncope may be a sign of a serious medical problem, so it is important to seek medical care. If you start to feel like you might pass out, sit or lie down right away. If sitting, lower your head down between your legs. If lying down, raise (elevate) your feet above the level  of your heart. Talk with your doctor about your symptoms. You may need to have testing to help find the cause. This information is not intended to replace advice given to you by your health care provider. Make sure you discuss any questions you have with your health care provider. Document Revised: 11/13/2020 Document Reviewed: 11/13/2020 Elsevier Patient Education  2024 Elsevier Inc.    Edwina Barth, MD [facility]

## 2022-12-22 NOTE — Assessment & Plan Note (Addendum)
Clinically stable.  No red flag signs or symptoms. Differential diagnosis discussed.  No neurological deficits detected Recent CT scan of brain normal.  Recent CT scan of abdomen and pelvis negative.  No signs of stroke.  No carotid bruits. Normal EKG.  Recommend repeat blood work and urinalysis today Suspect hyperventilation syndrome. ED precautions given. Recommend to also follow-up with PCP.

## 2022-12-22 NOTE — Patient Instructions (Signed)
Near-Syncope Near-syncope is when you suddenly feel like you might pass out or faint. This may also be called presyncope. During an episode of near-syncope, you may: Feel dizzy, weak, or light-headed. It may feel like the room is spinning. Feel like you may vomit (nauseous). See spots or see all white or all black. Have cold, clammy skin. Feel warm and sweaty. Hear ringing in your ears. This condition is caused by a sudden decrease in blood flow to the brain. This can result from many causes, but most of those causes are not dangerous. However, near-syncope may be a sign of a serious medical problem, so it is important to seek medical care. Follow these instructions at home: Medicines Take over-the-counter and prescription medicines only as told by your doctor. If you are taking blood pressure or heart medicine, get up slowly and spend many minutes getting ready to sit and then stand. This can help with dizziness. Lifestyle Do not drive, use machinery, or play sports until your doctor says it is okay. Do not drink alcohol. Do not smoke or use any products that contain nicotine or tobacco. If you need help quitting, ask your doctor. Avoid hot tubs and saunas. General instructions Be aware of any changes in your symptoms. Talk with your doctor about your symptoms. You may need to have testing to help find the cause. If you start to feel like you might pass out, sit or lie down right away. If sitting, lower your head down between your legs. If lying down, raise (elevate) your feet above the level of your heart. Breathe deeply and steadily. Wait until all of the symptoms are gone. Have someone stay with you until you feel better. Drink enough fluid to keep your pee (urine) pale yellow. Avoid standing for a long time. If you must stand for a long time, do movements such as: Moving your legs. Crossing your legs. Flexing and stretching your leg muscles. Squatting. Keep all follow-up  visits. Contact a doctor if: You continue to have episodes of near fainting. Get help right away if: You pass out or faint. You have any of these symptoms: Fast or uneven heartbeats (palpitations). Pain in your chest, belly, or back. Shortness of breath. You have a seizure. You have a very bad headache. You are confused. You have trouble seeing. You are very weak. You have trouble walking. You are bleeding from your mouth or butt. You have black or tarry poop (stool). These symptoms may be an emergency. Get help right away. Call your local emergency services (911 in the [place] Do not wait to see if the symptoms will go away. Do not drive yourself to the hospital. Summary Near-syncope is when you suddenly feel like you might pass out or faint. This condition is caused by a sudden decrease in blood flow to the brain. Near-syncope may be a sign of a serious medical problem, so it is important to seek medical care. If you start to feel like you might pass out, sit or lie down right away. If sitting, lower your head down between your legs. If lying down, raise (elevate) your feet above the level of your heart. Talk with your doctor about your symptoms. You may need to have testing to help find the cause. This information is not intended to replace advice given to you by your health care provider. Make sure you discuss any questions you have with your health care provider. Document Revised: 11/13/2020 Document Reviewed: 11/13/2020 Elsevier Patient Education  2024  [employer].

## 2022-12-23 LAB — COMPREHENSIVE METABOLIC PANEL
ALT: 17 U/L (ref 0–35)
AST: 19 U/L (ref 0–37)
Albumin: 5 g/dL (ref 3.5–5.2)
Alkaline Phosphatase: 57 U/L (ref 39–117)
BUN: 10 mg/dL (ref 6–23)
CO2: 23 mEq/L (ref 19–32)
Calcium: 10.4 mg/dL (ref 8.4–10.5)
Chloride: 100 mEq/L (ref 96–112)
Creatinine, Ser: 1.08 mg/dL (ref 0.40–1.20)
GFR: 57.97 mL/min — ABNORMAL LOW (ref >60.00)
Glucose, Bld: 95 mg/dL (ref 70–99)
Potassium: 3.8 mEq/L (ref 3.5–5.1)
Sodium: 140 mEq/L (ref 135–145)
Total Bilirubin: 0.6 mg/dL (ref 0.2–1.2)
Total Protein: 8.2 g/dL (ref 6.0–8.3)

## 2022-12-23 LAB — LIPID PANEL
Cholesterol: 200 mg/dL (ref 0–200)
HDL: 84.7 mg/dL (ref >39.00)
LDL Cholesterol: 103 mg/dL — ABNORMAL HIGH (ref 0–99)
NonHDL: 115.53
Total CHOL/HDL Ratio: 2
Triglycerides: 62 mg/dL (ref 0.0–149.0)
VLDL: 12.4 mg/dL (ref 0.0–40.0)

## 2022-12-23 LAB — THYROID PANEL WITH TSH
Free Thyroxine Index: 2.9 (ref 1.4–3.8)
T3 Uptake: 29 % (ref 22–35)
T4, Total: 10.1 ug/dL (ref 5.1–11.9)
TSH: 1.11 mIU/L

## 2023-01-05 ENCOUNTER — Ambulatory Visit: Payer: Managed Care, Other (non HMO) | Admitting: Internal Medicine

## 2023-01-10 ENCOUNTER — Telehealth: Payer: Self-pay

## 2023-01-10 ENCOUNTER — Encounter: Payer: Self-pay | Admitting: Internal Medicine

## 2023-01-10 ENCOUNTER — Ambulatory Visit: Payer: Managed Care, Other (non HMO) | Admitting: Internal Medicine

## 2023-01-10 VITALS — BP 120/74 | HR 75 | Temp 98.1°F | Ht 64.0 in | Wt 175.0 lb

## 2023-01-10 DIAGNOSIS — I872 Venous insufficiency (chronic) (peripheral): Secondary | ICD-10-CM

## 2023-01-10 DIAGNOSIS — E559 Vitamin D deficiency, unspecified: Secondary | ICD-10-CM

## 2023-01-10 DIAGNOSIS — F419 Anxiety disorder, unspecified: Secondary | ICD-10-CM

## 2023-01-10 DIAGNOSIS — R55 Syncope and collapse: Secondary | ICD-10-CM

## 2023-01-10 LAB — CORTISOL: Cortisol, Plasma: 9.6 ug/dL

## 2023-01-10 MED ORDER — LORAZEPAM 0.5 MG PO TABS: 0.5000 mg | ORAL_TABLET | Freq: Two times a day (BID) | ORAL | 1 refills | Status: DC | PRN

## 2023-01-10 NOTE — Telephone Encounter (Signed)
Ok done

## 2023-01-10 NOTE — Patient Instructions (Signed)
Please continue all other medications as before, and refills have been done if requested.  Please have the pharmacy call with any other refills you may need.  Please continue your efforts at being more active, low cholesterol diet, and weight control.  You are otherwise up to date with prevention measures today.  Please keep your appointments with your specialists as you may have planned  You will be contacted regarding the referral for: Echocardiogram, Heart monitor, and Cardiology referral  Please go to the LAB at the blood drawing area for the tests to be done - just the cortisol level today  You will be contacted by phone if any changes need to be made immediately.  Otherwise, you will receive a letter about your results with an explanation, but please check with MyChart first.  Please remember to sign up for MyChart if you have not done so, as this will be important to you in the future with finding out test results, communicating by private email, and scheduling acute appointments online when needed.  Please make an Appointment to return in 3 months, or sooner if needed

## 2023-01-13 ENCOUNTER — Encounter: Payer: Self-pay | Admitting: Internal Medicine

## 2023-01-13 DIAGNOSIS — F419 Anxiety disorder, unspecified: Secondary | ICD-10-CM | POA: Insufficient documentation

## 2023-01-13 NOTE — Assessment & Plan Note (Signed)
Last vitamin D Lab Results  Component Value Date   VD25OH 47.12 12/22/2022   Stable, cont oral replacement

## 2023-01-13 NOTE — Progress Notes (Signed)
Patient ID: WINOLA DRUM, female   DOB: 07/23/1965, 57 y.o.   MRN: [id number]        Chief Complaint: follow up dizziness / presyncope, low vit d, anxiety       HPI:  LADONNE SHARPLES is a 57 y.o. female here with c/o 1-2 wks worsening HA, fatigue, lower appetite, dizziness with lightheaded and several episodes near syncope, blurry vision, and hot flushing starting from head to lower body.  Pt denies chest pain, increased sob or doe, wheezing, orthopnea, PND, increased LE swelling, palpitations,  Pt denies polydipsia, polyuria, or new focal neuro s/s.    Pt denies fever, wt loss, night sweats, loss of appetite, or other constitutional symptoms  Denies worsening depressive symptoms, suicidal ideation, or panic; has ongoing anxiety, but these symptoms seem to be worsening and has had intermittent anxiety for several years.         Wt Readings from Last 3 Encounters:  01/10/23 175 lb (79.4 kg)  12/22/22 177 lb 8 oz (80.5 kg)  11/25/22 182 lb (82.6 kg)   BP Readings from Last 3 Encounters:  01/10/23 120/74  12/22/22 116/80  11/25/22 118/78         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   DIZZINESS 08/15/2008   DYSPNEA 05/20/2008   FATIGUE 05/20/2008   History of 2019 novel coronavirus disease (COVID-19) 05/23/2019   History of hiatal hernia    HSV infection    Hyperplastic colon polyp    KNEE PAIN, LEFT 09/11/2007   Left sided sciatica 04/19/2011   Lumbar herniated disc    Lynch syndrome    MIGRAINE HEADACHE 04/09/2007   Ovarian cyst    Palpitations 05/20/2008   Pancreatic lesion    PONV (postoperative  nausea and vomiting)    PREMATURE VENTRICULAR CONTRACTIONS 08/23/2008   RASH-NONVESICULAR 01/17/2008   RUQ PAIN 02/23/2010   SICKLE CELL TRAIT 04/09/2007   Tension headache 04/09/2007   VARICOSE VEINS LOWER EXTREMITIES W/OTH COMPS 09/11/2007   Wears glasses    Past Surgical History:  Procedure Laterality Date   CESAREAN SECTION  2004   COLONOSCOPY  2016   FINGER FRACTURE SURGERY Left 03/2019   LAPAROSCOPIC ASSISTED VAGINAL HYSTERECTOMY N/A 10/23/2012   Procedure: LAPAROSCOPIC ASSISTED VAGINAL HYSTERECTOMY;  Surgeon: Meriel Pica, MD;  Location: [facility];  Service: Gynecology;  Laterality: N/A;   TONSILLECTOMY  07/18/2009   TUBAL LIGATION  01/06/2012   with cyst removal   UPPER GASTROINTESTINAL ENDOSCOPY      reports that she has never smoked. She has never used smokeless tobacco. She reports current alcohol use. She reports that she does not use drugs. family history includes Brain cancer in her maternal aunt; Cancer in her cousin and mother; Heart attack in her father; Heart disease in her mother; Hypertension in her mother; Liver cancer in her maternal aunt; Lung cancer in her maternal aunt; Pancreatitis in her father. Allergies  Allergen Reactions   Morphine And Codeine Rash   Current Outpatient Medications on File Prior to Visit  Medication Sig Dispense Refill   fluocinonide cream (LIDEX) 0.05 %  Apply 1 Application topically as needed. (Patient not taking: Reported on 12/22/2022)     linaclotide (LINZESS) 72 MCG capsule Take 1 capsule (72 mcg total) by mouth daily before breakfast. (Patient not taking: Reported on 12/22/2022) 90 capsule 3   No current facility-administered medications on file prior to visit.        ROS:  All others reviewed and negative.  Objective        PE:  BP 120/74 (BP Location: Left Arm, Patient Position: Sitting, Cuff Size: Normal)   Pulse 75   Temp 98.1 F (36.7 C) (Oral)   Ht 5\' 4"  (1.626 m)   Wt 175 lb (79.4 kg)   LMP 10/05/2012   SpO2 99%    BMI 30.04 kg/m                 Constitutional: Pt appears in NAD               HENT: Head: NCAT.                Right Ear: External ear normal.                 Left Ear: External ear normal.                Eyes: . Pupils are equal, round, and reactive to light. Conjunctivae and EOM are normal               Nose: without d/c or deformity               Neck: Neck supple. Gross normal ROM               Cardiovascular: Normal rate and regular rhythm.                 Pulmonary/Chest: Effort normal and breath sounds without rales or wheezing.                Abd:  Soft, NT, ND, + BS, no organomegaly               Neurological: Pt is alert. At baseline orientation, motor grossly intact               Skin: Skin is warm. No rashes, no other new lesions, LE edema - none               Psychiatric: Pt behavior is normal without agitation   Micro: none  Cardiac tracings I have personally interpreted today:  none  Pertinent Radiological findings (summarize): none   Lab Results  Component Value Date   WBC 4.9 12/22/2022   HGB 14.9 12/22/2022   HCT 46.2 (H) 12/22/2022   PLT 264.0 12/22/2022   GLUCOSE 95 12/22/2022   CHOL 200 12/22/2022   TRIG 62.0 12/22/2022   HDL 84.70 12/22/2022   LDLCALC 103 (H) 12/22/2022   ALT 17 12/22/2022   AST 19 12/22/2022   NA 140 12/22/2022   K 3.8 12/22/2022   CL 100 12/22/2022   CREATININE 1.08 12/22/2022   BUN 10 12/22/2022   CO2 23 12/22/2022   TSH 1.11 12/22/2022   INR 1.0 03/01/2007   HGBA1C 5.4 12/22/2022   Assessment/Plan:  MARSHAWN NINNEMAN is a 57 y.o. Black or African American [2] female with  has a past medical history of ACUTE NASOPHARYNGITIS (05/07/2010), Allergic rhinitis, cause unspecified (04/19/2011), Allergy, Anemia, Anxiety, BACK PAIN (02/05/2010), CELLULITIS (01/13/2010), CHEST PAIN UNSPECIFIED (08/23/2008), CONSTIPATION, CHRONIC (04/09/2007), DEEP VENOUS  THROMBOPHLEBITIS, LEFT, LEG, HX OF (04/09/2007), DEGENERATIVE DISC DISEASE, LUMBAR SPINE  (05/12/2007), DIZZINESS (08/15/2008), DYSPNEA (05/20/2008), FATIGUE (05/20/2008), History of 2019 novel coronavirus disease (COVID-19) (05/23/2019), History of hiatal hernia, HSV infection, Hyperplastic colon polyp, KNEE PAIN, LEFT (09/11/2007), Left sided sciatica (04/19/2011), Lumbar herniated disc, Lynch syndrome, MIGRAINE HEADACHE (04/09/2007), Ovarian cyst, Palpitations (05/20/2008), Pancreatic lesion, PONV (postoperative nausea and vomiting), PREMATURE VENTRICULAR CONTRACTIONS (08/23/2008), RASH-NONVESICULAR (01/17/2008), RUQ PAIN (02/23/2010), SICKLE CELL TRAIT (04/09/2007), Tension headache (04/09/2007), VARICOSE VEINS LOWER EXTREMITIES W/OTH COMPS (09/11/2007), and Wears glasses.  Syncope Etiology unclear, recent labs unrevealing, can't r/o other such as CV - for echo, cardiac monitor, and cardiology referral  Vitamin D deficiency Last vitamin D Lab Results  Component Value Date   VD25OH 47.12 12/22/2022   Stable, cont oral replacement   Anxiety Chronic mild intermittent, declines change in tx or referral for now,  to f/u any worsening symptoms or concerns  Venous (peripheral) insufficiency Improved recently,  to f/u any worsening symptoms or concerns  Followup: Return in about 3 months (around 04/12/2023).  Oliver Barre, MD 01/13/2023 8:44 PM [facility]

## 2023-01-13 NOTE — Assessment & Plan Note (Signed)
Improved recently,  to f/u any worsening symptoms or concerns

## 2023-01-13 NOTE — Assessment & Plan Note (Signed)
Etiology unclear, recent labs unrevealing, can't r/o other such as CV - for echo, cardiac monitor, and cardiology referral

## 2023-01-13 NOTE — Assessment & Plan Note (Signed)
Chronic mild intermittent, declines change in tx or referral for now,  to f/u any worsening symptoms or concerns

## 2023-01-14 DIAGNOSIS — R55 Syncope and collapse: Secondary | ICD-10-CM | POA: Diagnosis not present

## 2023-02-15 ENCOUNTER — Ambulatory Visit: Payer: Managed Care, Other (non HMO) | Attending: Internal Medicine

## 2023-02-15 DIAGNOSIS — R55 Syncope and collapse: Secondary | ICD-10-CM

## 2023-02-23 ENCOUNTER — Ambulatory Visit (HOSPITAL_COMMUNITY): Payer: Managed Care, Other (non HMO) | Attending: Internal Medicine

## 2023-02-23 ENCOUNTER — Encounter: Payer: Self-pay | Admitting: Internal Medicine

## 2023-02-23 DIAGNOSIS — R55 Syncope and collapse: Secondary | ICD-10-CM | POA: Diagnosis not present

## 2023-02-23 LAB — ECHOCARDIOGRAM COMPLETE
Area-P 1/2: 3.85 cm2
S' Lateral: 3 cm

## 2023-03-03 ENCOUNTER — Encounter (INDEPENDENT_AMBULATORY_CARE_PROVIDER_SITE_OTHER): Payer: Self-pay

## 2023-03-10 ENCOUNTER — Other Ambulatory Visit (HOSPITAL_COMMUNITY): Payer: Managed Care, Other (non HMO)

## 2023-04-04 ENCOUNTER — Ambulatory Visit: Payer: Managed Care, Other (non HMO) | Attending: Internal Medicine | Admitting: Cardiology

## 2023-04-04 ENCOUNTER — Encounter: Payer: Self-pay | Admitting: Cardiology

## 2023-04-04 ENCOUNTER — Ambulatory Visit: Payer: Managed Care, Other (non HMO) | Admitting: Internal Medicine

## 2023-04-04 VITALS — BP 124/82 | HR 87 | Ht 65.0 in | Wt 182.0 lb

## 2023-04-04 DIAGNOSIS — R072 Precordial pain: Secondary | ICD-10-CM | POA: Diagnosis not present

## 2023-04-04 DIAGNOSIS — Z01812 Encounter for preprocedural laboratory examination: Secondary | ICD-10-CM | POA: Diagnosis not present

## 2023-04-04 DIAGNOSIS — R079 Chest pain, unspecified: Secondary | ICD-10-CM

## 2023-04-04 MED ORDER — METOPROLOL TARTRATE 100 MG PO TABS: ORAL_TABLET | ORAL | 0 refills | Status: DC

## 2023-04-04 NOTE — Progress Notes (Signed)
Cardiology Office Note:    Date:  04/08/2023   ID:  [name], DOB 1965/12/29, MRN [id number]  PCP:  Corwin Levins, MD  Cardiologist:  Thomasene Ripple, DO  Electrophysiologist:  None   Referring MD: Corwin Levins, MD   " I am having chest pain"   History of Present Illness:    [name] is a 57 y.o. female with a hx of family history of premature coronary artery disease in her father, anxiety, seasonal allergies here today to be evaluated for chest discomfort  She is here today to be evaluated for intermittent chest discomfort.  She described as a midsternal chest pain comes off and off.  Nothing makes it better or worse.  She is concerned.  She does have family history of premature coronary artery disease in her dad.  She notes that her mom had blockages, but died of cancer complication.   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   DIZZINESS 08/15/2008   DYSPNEA 05/20/2008   FATIGUE 05/20/2008   History of 2019 novel coronavirus disease (COVID-19) 05/23/2019   History of hiatal hernia    HSV infection    Hyperplastic colon polyp    KNEE PAIN, LEFT 09/11/2007   Left sided sciatica 04/19/2011   Lumbar herniated disc    Lynch syndrome    MIGRAINE HEADACHE 04/09/2007   Ovarian cyst    Palpitations 05/20/2008   Pancreatic lesion    PONV (postoperative nausea and vomiting)    PREMATURE VENTRICULAR CONTRACTIONS 08/23/2008   RASH-NONVESICULAR 01/17/2008   RUQ PAIN 02/23/2010   SICKLE CELL TRAIT 04/09/2007   Tension headache 04/09/2007   VARICOSE VEINS LOWER EXTREMITIES W/OTH COMPS 09/11/2007    Wears glasses     Past Surgical History:  Procedure Laterality Date   CESAREAN SECTION  2004   COLONOSCOPY  2016   FINGER FRACTURE SURGERY Left 03/2019   LAPAROSCOPIC ASSISTED VAGINAL HYSTERECTOMY N/A 10/23/2012   Procedure: LAPAROSCOPIC ASSISTED VAGINAL HYSTERECTOMY;  Surgeon: Meriel Pica, MD;  Location: [facility];  Service: Gynecology;  Laterality: N/A;   TONSILLECTOMY  07/18/2009   TUBAL LIGATION  01/06/2012   with cyst removal   UPPER GASTROINTESTINAL ENDOSCOPY      Current Medications: Current Meds  Medication Sig   linaclotide (LINZESS) 72 MCG capsule Take 1 capsule (72 mcg total) by mouth daily before breakfast. (Patient taking differently: Take 72 mcg by mouth as needed.)   metoprolol tartrate (LOPRESSOR) 100 MG tablet Take 2 hours prior to CT   pantoprazole (PROTONIX) 40 MG tablet Take 40 mg by mouth as needed.     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     Employer: [employer]   Occupation: currently does not work outside the home (12/06/19)  Tobacco Use  Smoking status: Never   Smokeless tobacco: Never  Vaping Use   Vaping status: Never Used  Substance and Sexual Activity   Alcohol use: Yes    Comment: socially rare every 1-2 months   Drug use: No   Sexual activity: Not on file  Other Topics Concern   Not on file  Social History Narrative   Not on file   Social Determinants of Health   Financial Resource Strain: Low Risk  (10/25/2022)   Overall Financial Resource Strain (CARDIA)    Difficulty of Paying Living Expenses: Not hard at all  Food Insecurity: No Food Insecurity (10/25/2022)   Hunger Vital Sign    Worried About Running Out of Food in the Last Year: Never true    Ran Out  of Food in the Last Year: Never true  Transportation Needs: No Transportation Needs (10/25/2022)   PRAPARE - Administrator, Civil Service (Medical): No    Lack of Transportation (Non-Medical): No  Physical Activity: Insufficiently Active (10/25/2022)   Exercise Vital Sign    Days of Exercise per Week: 2 days    Minutes of Exercise per Session: 30 min  Stress: No Stress Concern Present (10/25/2022)   [employer] of Occupational Health - Occupational Stress Questionnaire    Feeling of Stress : Not at all  Social Connections: Moderately Integrated (10/25/2022)   Social Connection and Isolation Panel [NHANES]    Frequency of Communication with Friends and Family: More than three times a week    Frequency of Social Gatherings with Friends and Family: Three times a week    Attends Religious Services: 1 to 4 times per year    Active Member of Clubs or Organizations: No    Attends Engineer, structural: Not on file    Marital Status: Married     Family History: The patient's family history includes Brain cancer in her maternal aunt; Cancer in her cousin and mother; Heart attack in her father; Heart disease in her mother; Hypertension in her mother; Liver cancer in her maternal aunt; Lung cancer in her maternal aunt; Pancreatitis in her father. There is no history of Colon cancer, Colon polyps, Esophageal cancer, Rectal cancer, or Stomach cancer.  ROS:   Review of Systems  Constitution: Negative for decreased appetite, fever and weight gain.  HENT: Negative for congestion, ear discharge, hoarse voice and sore throat.   Eyes: Negative for discharge, redness, vision loss in right eye and visual halos.  Cardiovascular: Negative for chest pain, dyspnea on exertion, leg swelling, orthopnea and palpitations.  Respiratory: Negative for cough, hemoptysis, shortness of breath and snoring.   Endocrine: Negative for heat intolerance and polyphagia.  Hematologic/Lymphatic: Negative for  bleeding problem. Does not bruise/bleed easily.  Skin: Negative for flushing, nail changes, rash and suspicious lesions.  Musculoskeletal: Negative for arthritis, joint pain, muscle cramps, myalgias, neck pain and stiffness.  Gastrointestinal: Negative for abdominal pain, bowel incontinence, diarrhea and excessive appetite.  Genitourinary: Negative for decreased libido, genital sores and incomplete emptying.  Neurological: Negative for brief paralysis, focal weakness, headaches and loss of balance.  Psychiatric/Behavioral: Negative for altered mental status, depression and suicidal ideas.  Allergic/Immunologic: Negative for HIV exposure and persistent infections.    EKGs/Labs/Other Studies Reviewed:    The following studies were reviewed today:   EKG:  The ekg ordered today demonstrates sinus rhythm, heart rate 75 bpm.  Recent Labs: 12/22/2022: ALT 17; Hemoglobin 14.9; Platelets 264.0; TSH 1.11 04/04/2023: BUN 10; Creatinine, Ser 0.99; Magnesium  2.1; Potassium 3.9; Sodium 143  Recent Lipid Panel    Component Value Date/Time   CHOL 200 12/22/2022 1419   TRIG 62.0 12/22/2022 1419   HDL 84.70 12/22/2022 1419   CHOLHDL 2 12/22/2022 1419   VLDL 12.4 12/22/2022 1419   LDLCALC 103 (H) 12/22/2022 1419    Physical Exam:    VS:  BP 124/82 (BP Location: Left Arm, Patient Position: Sitting, Cuff Size: Normal)   Pulse 87   Ht 5\' 5"  (1.651 m)   Wt 182 lb (82.6 kg)   LMP 10/05/2012   SpO2 96%   BMI 30.29 kg/m     Wt Readings from Last 3 Encounters:  04/04/23 182 lb (82.6 kg)  01/10/23 175 lb (79.4 kg)  12/22/22 177 lb 8 oz (80.5 kg)     GEN: Well nourished, well developed in no acute distress HEENT: Normal NECK: No JVD; No carotid bruits LYMPHATICS: No lymphadenopathy CARDIAC: S1S2 noted,RRR, no murmurs, rubs, gallops RESPIRATORY:  Clear to auscultation without rales, wheezing or rhonchi  ABDOMEN: Soft, non-tender, non-distended, +bowel sounds, no guarding. EXTREMITIES: No edema,  No cyanosis, no clubbing MUSCULOSKELETAL:  No deformity  SKIN: Warm and dry NEUROLOGIC:  Alert and oriented x 3, non-focal PSYCHIATRIC:  Normal affect, good insight  ASSESSMENT:    1. Chest pain, unspecified type   2. Precordial pain   3. Pre-procedure lab exam    PLAN:      Reviewed her previous echocardiogram which was recently done within normal limits, her ambulatory monitor does not show any arrhythmias.  The symptoms chest pain is concerning, this patient does have intermediate risk for coronary artery disease and at this time I would like to pursue an ischemic evaluation in this patient.  Shared decision a coronary CTA at this time is appropriate.  I have discussed with the patient about the testing.  The patient has no IV contrast allergy and is agreeable to proceed with this test.  The patient is in agreement with the above plan. The patient left the office in stable condition.  The patient will follow up in   Medication Adjustments/Labs and Tests Ordered: Current medicines are reviewed at length with the patient today.  Concerns regarding medicines are outlined above.  Orders Placed This Encounter  Procedures   CT CORONARY MORPH W/CTA COR W/SCORE W/CA W/CM &/OR WO/CM   Basic Metabolic Panel (BMET)   Magnesium   EKG 12-Lead   Meds ordered this encounter  Medications   metoprolol tartrate (LOPRESSOR) 100 MG tablet    Sig: Take 2 hours prior to CT    Dispense:  1 tablet    Refill:  0    Patient Instructions  Medication Instructions:  Your physician recommends that you continue on your current medications as directed. Please refer to the Current Medication list given to you today.  *If you need a refill on your cardiac medications before your next appointment, please call your pharmacy*   Lab Work: Your physician recommends that you have labs drawn today: BMET, Mag If you have labs (blood work) drawn today and your tests are completely normal, you will receive  your results only by: MyChart Message (if you have MyChart) OR A paper copy in the mail If you have any lab test that is abnormal or we need to change your treatment, we will call you to review the results.   Testing/Procedures:   Your cardiac CT will be scheduled at one of the below locations:   [facility]  [address] [place], [place] [address] [phone]  If scheduled at [facility], please arrive at the [facility] and Children's Entrance (Entrance C2) of [facility] 30 minutes prior to test start time. You can use the FREE valet parking offered at entrance C (encouraged to control the heart rate for the test)  Proceed to the [facility] Radiology Department (first floor) to check-in and test prep.  All radiology patients and guests should use entrance C2 at [facility], accessed from [facility], even though the hospital's physical address listed is [address].     Please follow these instructions carefully (unless otherwise directed):  An IV will be required for this test and Nitroglycerin will be given.  Hold all erectile dysfunction medications at least 3 days (72 hrs) prior to test. (Ie viagra, cialis, sildenafil, tadalafil, etc)   On the Night Before the Test: Be sure to Drink plenty of water. Do not consume any caffeinated/decaffeinated beverages or chocolate 12 hours prior to your test. Do not take any antihistamines 12 hours prior to your test.   On the Day of the Test: Drink plenty of water until 1 hour prior to the test. Do not eat any food 1 hour prior to test. You may take your regular medications prior to the test.  Take metoprolol (Lopressor) two hours prior to test. If you take Furosemide/Hydrochlorothiazide/Spironolactone, please HOLD on the morning of the test. FEMALES- please wear underwire-free bra if available, avoid dresses & tight clothing      After the Test: Drink plenty of  water. After receiving IV contrast, you may experience a mild flushed feeling. This is normal. On occasion, you may experience a mild rash up to 24 hours after the test. This is not dangerous. If this occurs, you can take Benadryl 25 mg and increase your fluid intake. If you experience trouble breathing, this can be serious. If it is severe call 911 IMMEDIATELY. If it is mild, please call our office. If you take any of these medications: Glipizide/Metformin, Avandament, Glucavance, please do not take 48 hours after completing test unless otherwise instructed.  We will call to schedule your test 2-4 weeks out understanding that some insurance companies will need an authorization prior to the service being performed.   For more information and frequently asked questions, please visit our website : [email or web address]  For non-scheduling related questions, please contact the cardiac imaging nurse navigator should you have any questions/concerns: Cardiac Imaging Nurse Navigators Direct Office Dial: [phone]   For scheduling needs, including cancellations and rescheduling, please call [place], [phone].    Follow-Up: At [facility], you and your health needs are our priority.  As part of our continuing mission to provide you with exceptional heart care, we have created designated Provider Care Teams.  These Care Teams include your primary Cardiologist (physician) and Advanced Practice Providers (APPs -  Physician Assistants and Nurse Practitioners) who all work together to provide you with the care you need, when you need it.   Your next appointment:   4 month(s)  Provider:   Thomasene Ripple, DO     Adopting a Healthy Lifestyle.  Know what a healthy weight is for you (roughly BMI <25) and aim to maintain this   Aim for 7+ servings of fruits and vegetables daily   65-80+ fluid ounces of water or unsweet tea for healthy kidneys   Limit to max 1 drink of  alcohol per day; avoid smoking/tobacco  Limit animal fats in diet for cholesterol and heart health - choose grass fed whenever available   Avoid highly processed foods, and foods high in saturated/trans fats   Aim for low stress - take time to unwind and care for your mental health   Aim for 150 min of moderate intensity exercise weekly for heart health, and weights twice weekly for bone health   Aim for 7-9 hours of sleep daily   When it comes to diets, agreement about the perfect plan isnt easy to find, even among the experts. Experts at the [facility] [employer] developed an idea known as the Healthy Eating Plate. Just imagine a plate divided into logical, healthy portions.   The emphasis is on diet quality:   Load up on vegetables and fruits - one-half of your plate: Aim for color and variety, and remember that potatoes dont count.   Go for whole grains - one-quarter of your plate: Whole wheat, barley, wheat berries, quinoa, oats, brown rice, and foods made with them. If you want pasta, go with whole wheat pasta.   Protein power - one-quarter of your plate: Fish, chicken, beans, and nuts are all healthy, versatile protein sources. Limit red meat.   The diet, however, does go beyond the plate, offering a few other suggestions.   Use healthy plant oils, such as olive, canola, soy, corn, sunflower and peanut. Check the labels, and avoid partially hydrogenated oil, which have unhealthy trans fats.   If youre thirsty, drink water. Coffee and tea are good in moderation, but skip sugary drinks and limit milk and dairy products to one or two daily servings.   The type of carbohydrate in the diet is more important than the amount. Some sources of carbohydrates, such as vegetables, fruits, whole grains, and beans-are healthier than others.   Finally, stay active  Osvaldo Shipper, DO  04/08/2023 9:46 PM    [facility]

## 2023-04-04 NOTE — Patient Instructions (Addendum)
Medication Instructions:  Your physician recommends that you continue on your current medications as directed. Please refer to the Current Medication list given to you today.  *If you need a refill on your cardiac medications before your next appointment, please call your pharmacy*   Lab Work: Your physician recommends that you have labs drawn today: BMET, Mag If you have labs (blood work) drawn today and your tests are completely normal, you will receive your results only by: MyChart Message (if you have MyChart) OR A paper copy in the mail If you have any lab test that is abnormal or we need to change your treatment, we will call you to review the results.   Testing/Procedures:   Your cardiac CT will be scheduled at one of the below locations:   [facility] [address] [place], [place] [address] [phone]  If scheduled at [facility], please arrive at the [facility] and Children's Entrance (Entrance C2) of [facility] 30 minutes prior to test start time. You can use the FREE valet parking offered at entrance C (encouraged to control the heart rate for the test)  Proceed to the [facility] Radiology Department (first floor) to check-in and test prep.  All radiology patients and guests should use entrance C2 at [facility], accessed from [facility], even though the hospital's physical address listed is [address]     Please follow these instructions carefully (unless otherwise directed):  An IV will be required for this test and Nitroglycerin will be given.  Hold all erectile dysfunction medications at least 3 days (72 hrs) prior to test. (Ie viagra, cialis, sildenafil, tadalafil, etc)   On the Night Before the Test: Be sure to Drink plenty of water. Do not consume any caffeinated/decaffeinated beverages or chocolate 12 hours prior to your test. Do not take any antihistamines 12 hours prior to your  test.   On the Day of the Test: Drink plenty of water until 1 hour prior to the test. Do not eat any food 1 hour prior to test. You may take your regular medications prior to the test.  Take metoprolol (Lopressor) two hours prior to test. If you take Furosemide/Hydrochlorothiazide/Spironolactone, please HOLD on the morning of the test. FEMALES- please wear underwire-free bra if available, avoid dresses & tight clothing      After the Test: Drink plenty of water. After receiving IV contrast, you may experience a mild flushed feeling. This is normal. On occasion, you may experience a mild rash up to 24 hours after the test. This is not dangerous. If this occurs, you can take Benadryl 25 mg and increase your fluid intake. If you experience trouble breathing, this can be serious. If it is severe call 911 IMMEDIATELY. If it is mild, please call our office. If you take any of these medications: Glipizide/Metformin, Avandament, Glucavance, please do not take 48 hours after completing test unless otherwise instructed.  We will call to schedule your test 2-4 weeks out understanding that some insurance companies will need an authorization prior to the service being performed.   For more information and frequently asked questions, please visit our website : [email or web address]  For non-scheduling related questions, please contact the cardiac imaging nurse navigator should you have any questions/concerns: Cardiac Imaging Nurse Navigators Direct Office Dial: [phone]   For scheduling needs, including cancellations and rescheduling, please call [place], [phone].    Follow-Up: At [facility], you and your health needs are  our priority.  As part of our continuing mission to provide you with exceptional heart care, we have created designated Provider Care Teams.  These Care Teams include your primary Cardiologist (physician) and Advanced Practice Providers (APPs -   Physician Assistants and Nurse Practitioners) who all work together to provide you with the care you need, when you need it.   Your next appointment:   4 month(s)  Provider:   Thomasene Ripple, DO

## 2023-04-05 LAB — BASIC METABOLIC PANEL
BUN/Creatinine Ratio: 10 (ref 9–23)
BUN: 10 mg/dL (ref 6–24)
CO2: 20 mmol/L (ref 20–29)
Calcium: 9.8 mg/dL (ref 8.7–10.2)
Chloride: 104 mmol/L (ref 96–106)
Creatinine, Ser: 0.99 mg/dL (ref 0.57–1.00)
Glucose: 68 mg/dL — ABNORMAL LOW (ref 70–99)
Potassium: 3.9 mmol/L (ref 3.5–5.2)
Sodium: 143 mmol/L (ref 134–144)
eGFR: 67 mL/min/{1.73_m2} (ref >59)

## 2023-04-05 LAB — MAGNESIUM: Magnesium: 2.1 mg/dL (ref 1.6–2.3)

## 2023-04-08 ENCOUNTER — Encounter (HOSPITAL_COMMUNITY): Payer: Self-pay

## 2023-04-12 ENCOUNTER — Ambulatory Visit: Payer: Managed Care, Other (non HMO) | Admitting: Internal Medicine

## 2023-04-13 ENCOUNTER — Ambulatory Visit (HOSPITAL_COMMUNITY)
Admission: RE | Admit: 2023-04-13 | Discharge: 2023-04-13 | Disposition: A | Discharge status: Home / Self Care | Payer: Managed Care, Other (non HMO) | Source: Ambulatory Visit | Attending: Cardiology | Admitting: Cardiology

## 2023-04-13 DIAGNOSIS — R072 Precordial pain: Secondary | ICD-10-CM

## 2023-04-13 MED ORDER — NITROGLYCERIN 0.4 MG SL SUBL
0.8000 mg | SUBLINGUAL_TABLET | Freq: Once | SUBLINGUAL | Status: AC
  Administered 2023-04-13: 0.8 mg via SUBLINGUAL

## 2023-04-13 MED ORDER — NITROGLYCERIN 0.4 MG SL SUBL
SUBLINGUAL_TABLET | SUBLINGUAL | Status: AC
  Filled 2023-04-13: qty 2

## 2023-04-13 MED ORDER — IOHEXOL 350 MG/ML SOLN
95.0000 mL | Freq: Once | INTRAVENOUS | Status: AC | PRN
  Administered 2023-04-13: 95 mL via INTRAVENOUS

## 2023-04-19 ENCOUNTER — Telehealth: Payer: Self-pay | Admitting: Physician Assistant

## 2023-04-19 NOTE — Telephone Encounter (Signed)
Pt states she is having issues with a globus sensation in her throat, epigastric pain, and bloating. She was seen by cardiology and she states it is not a heart issue. Pt scheduled to see Doug Sou PA 04/26/23 at 8:30am. Pt aware of appt.

## 2023-04-19 NOTE — Telephone Encounter (Signed)
Inbound call from patient, wishing to schedule an appointment for tightness in her chest and esophagus. Patient was advised soonest appointment would be in December. Patient states she would like to speak with a nurse to discuss symptoms. Please advise.

## 2023-04-26 ENCOUNTER — Encounter: Payer: Self-pay | Admitting: Internal Medicine

## 2023-04-26 ENCOUNTER — Encounter: Payer: Self-pay | Admitting: Gastroenterology

## 2023-04-26 ENCOUNTER — Ambulatory Visit: Payer: Managed Care, Other (non HMO) | Admitting: Gastroenterology

## 2023-04-26 VITALS — BP 110/80 | HR 70 | Ht 65.0 in | Wt 181.0 lb

## 2023-04-26 DIAGNOSIS — K219 Gastro-esophageal reflux disease without esophagitis: Secondary | ICD-10-CM | POA: Diagnosis not present

## 2023-04-26 DIAGNOSIS — R0789 Other chest pain: Secondary | ICD-10-CM | POA: Diagnosis not present

## 2023-04-26 NOTE — Progress Notes (Signed)
04/26/2023 [name] [id number] [birth year]   HISTORY OF PRESENT ILLNESS:  This is a 57 year old female with Lynch syndrome who is a patient of Dr. Lauro Franklin.  She is here today with complaints of atypical chest pain deemed to be noncardiac in origin as well as tightness in her throat/globus sensation.  No abdominal pain.  Has history of GERD and had a gastric ulcer earlier this year on endoscopy.  She had been on her pantoprazole 40 mg twice daily and then down to once daily, but now has only been using it intermittently on an as needed basis.  Had not been taking it at all recently and now for the past few weeks has been experiencing a tight feeling throughout her chest and the above symptoms.  Had some heartburn/reflux type symptoms as well.  She says that she resume it for 3 days in a row and it helped with the heartburn.   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   DIZZINESS 08/15/2008   DYSPNEA 05/20/2008   FATIGUE 05/20/2008   History of 2019 novel coronavirus disease (COVID-19) 05/23/2019   History of hiatal hernia    HSV infection    Hyperplastic colon polyp    KNEE PAIN, LEFT 09/11/2007   Left sided sciatica 04/19/2011   Lumbar herniated disc    Lynch syndrome    MIGRAINE HEADACHE 04/09/2007   Ovarian cyst    Palpitations 05/20/2008   Pancreatic lesion    PONV (postoperative nausea and vomiting)    PREMATURE VENTRICULAR CONTRACTIONS 08/23/2008   RASH-NONVESICULAR 01/17/2008   RUQ PAIN 02/23/2010   SICKLE CELL TRAIT 04/09/2007   Tension headache 04/09/2007   VARICOSE VEINS LOWER  EXTREMITIES W/OTH COMPS 09/11/2007   Wears glasses    Past Surgical History:  Procedure Laterality Date   CESAREAN SECTION  2004   COLONOSCOPY  2016   FINGER FRACTURE SURGERY Left 03/2019   LAPAROSCOPIC ASSISTED VAGINAL HYSTERECTOMY N/A 10/23/2012   Procedure: LAPAROSCOPIC ASSISTED VAGINAL HYSTERECTOMY;  Surgeon: Meriel Pica, MD;  Location: [facility];  Service: Gynecology;  Laterality: N/A;   TONSILLECTOMY  07/18/2009   TUBAL LIGATION  01/06/2012   with cyst removal   UPPER GASTROINTESTINAL ENDOSCOPY      reports that she has never smoked. She has never used smokeless tobacco. She reports current alcohol use. She reports that she does not use drugs. family history includes Brain cancer in her maternal aunt; Cancer in her cousin and mother; Heart attack in her father; Heart disease in her mother; Hypertension in her mother; Liver cancer in her maternal aunt; Lung cancer in her maternal aunt; Pancreatitis in her father. Allergies  Allergen Reactions   Morphine And Codeine Rash      Outpatient Encounter Medications as of 04/26/2023  Medication Sig   linaclotide (LINZESS) 72 MCG capsule Take 1 capsule (72 mcg total) by mouth daily before breakfast. (Patient taking differently: Take 72 mcg by mouth as needed.)   pantoprazole (PROTONIX) 40 MG tablet Take 40 mg by mouth as needed.   fluocinonide cream (LIDEX) 0.05 % Apply 1 Application  topically as needed. (Patient not taking: Reported on 12/22/2022)   [DISCONTINUED] LORazepam (ATIVAN) 0.5 MG tablet Take 1 tablet (0.5 mg total) by mouth 2 (two) times daily as needed for anxiety. (Patient not taking: Reported on 04/04/2023)   [DISCONTINUED] metoprolol tartrate (LOPRESSOR) 100 MG tablet Take 2 hours prior to CT   No facility-administered encounter medications on file as of 04/26/2023.     REVIEW OF SYSTEMS  : All other systems reviewed and negative except where noted in the History of Present Illness.   PHYSICAL EXAM: BP 110/80   Pulse 70    Ht 5\' 5"  (1.651 m)   Wt 181 lb (82.1 kg)   LMP 10/05/2012   BMI 30.12 kg/m  General: Well developed female in no acute distress Head: Normocephalic and atraumatic Eyes:  Sclerae anicteric, conjunctiva pink. Ears: Normal auditory acuity Lungs: Clear throughout to auscultation; no W/R/R. Heart: Regular rate and rhythm; no M/R/G. Abdomen: Soft, non-distended.  BS present.  Mild epigastric TTP. Musculoskeletal: Symmetrical with no gross deformities  Skin: No lesions on visible extremities Extremities: No edema  Neurological: Alert oriented x 4, grossly non-focal Psychological:  Alert and cooperative. Normal mood and affect  ASSESSMENT AND PLAN: 57 year old female with Lynch syndrome who is complaining of atypical chest pain deemed to be noncardiac in origin as well as tightness in her throat/globus sensation.  Has history of GERD and had a gastric ulcer earlier this year on endoscopy.  She had been on her pantoprazole 40 mg twice daily and then down to once daily, but now has only been using it intermittently on an as needed basis.  Had not been taking it at all recently and now for the past few weeks has been experiencing a tight feeling throughout her chest and the above symptoms.  Will restart her pantoprazole 40 mg daily and see how she does with that.  She will contact us back in about a month with an update on her symptoms.   CC:  Corwin Levins, MD

## 2023-04-26 NOTE — Patient Instructions (Addendum)
Restart Pantoprazole daily.   Send Mychart message with an update in 4 weeks.   _______________________________________________________  If your blood pressure at your visit was 140/90 or greater, please contact your primary care physician to follow up on this.  _______________________________________________________  If you are age 55 or older, your body mass index should be between 23-30. Your Body mass index is 30.12 kg/m. If this is out of the aforementioned range listed, please consider follow up with your Primary Care Provider.  If you are age 84 or younger, your body mass index should be between 19-25. Your Body mass index is 30.12 kg/m. If this is out of the aformentioned range listed, please consider follow up with your Primary Care Provider.   ________________________________________________________  The [facility] providers would like to encourage you to use [facility] to communicate with providers for non-urgent requests or questions.  Due to long hold times on the telephone, sending your provider a message by [facility] may be a faster and more efficient way to get a response.  Please allow 48 business hours for a response.  Please remember that this is for non-urgent requests.  _______________________________________________________

## 2023-04-27 NOTE — Progress Notes (Signed)
Addendum: Reviewed and agree with assessment and management plan. Kadijah Shamoon M, MD  

## 2023-05-12 MED ORDER — SUCRALFATE 1 GM/10ML PO SUSP: 1.0000 g | Freq: Two times a day (BID) | ORAL | 1 refills | Status: DC

## 2023-05-12 NOTE — Addendum Note (Signed)
Addended by: Loretha Stapler on: 05/12/2023 01:07 PM   Modules accepted: Orders

## 2023-05-12 NOTE — Telephone Encounter (Signed)
[name] see note from the pt regarding continued symptoms.

## 2023-06-03 NOTE — Telephone Encounter (Signed)
Scheduled patient follow up with Dr. Rhea Belton for 06/06/23 @ 1:50 PM.

## 2023-06-06 ENCOUNTER — Encounter: Payer: Self-pay | Admitting: Internal Medicine

## 2023-06-06 ENCOUNTER — Ambulatory Visit (INDEPENDENT_AMBULATORY_CARE_PROVIDER_SITE_OTHER)
Admission: RE | Admit: 2023-06-06 | Discharge: 2023-06-06 | Disposition: A | Discharge status: Home / Self Care | Payer: Managed Care, Other (non HMO) | Source: Ambulatory Visit | Attending: Internal Medicine | Admitting: Internal Medicine

## 2023-06-06 ENCOUNTER — Ambulatory Visit: Payer: Managed Care, Other (non HMO) | Admitting: Internal Medicine

## 2023-06-06 VITALS — BP 120/74 | HR 79 | Ht 65.0 in | Wt 180.4 lb

## 2023-06-06 DIAGNOSIS — Z1509 Genetic susceptibility to other malignant neoplasm: Secondary | ICD-10-CM | POA: Diagnosis not present

## 2023-06-06 DIAGNOSIS — K219 Gastro-esophageal reflux disease without esophagitis: Secondary | ICD-10-CM | POA: Diagnosis not present

## 2023-06-06 DIAGNOSIS — R0789 Other chest pain: Secondary | ICD-10-CM | POA: Diagnosis not present

## 2023-06-06 MED ORDER — TRULANCE 3 MG PO TABS: 3.0000 mg | ORAL_TABLET | Freq: Every day | ORAL | 0 refills | Status: AC

## 2023-06-06 MED ORDER — LINACLOTIDE 72 MCG PO CAPS: 72.0000 ug | ORAL_CAPSULE | Freq: Every day | ORAL | 3 refills | Status: DC

## 2023-06-06 MED ORDER — PANTOPRAZOLE SODIUM 40 MG PO TBEC: 40.0000 mg | DELAYED_RELEASE_TABLET | ORAL | 1 refills | Status: DC | PRN

## 2023-06-06 MED ORDER — NA SULFATE-K SULFATE-MG SULF 17.5-3.13-1.6 GM/177ML PO SOLN: 1.0000 | Freq: Once | ORAL | 0 refills | Status: AC

## 2023-06-06 MED ORDER — DEXLANSOPRAZOLE 60 MG PO CPDR: 60.0000 mg | DELAYED_RELEASE_CAPSULE | Freq: Every day | ORAL | 3 refills | Status: DC

## 2023-06-06 NOTE — Progress Notes (Signed)
Subjective:    Patient ID: CRISTLE BEATSON, female    DOB: 1965/10/03, 58 y.o.   MRN: [id number]  HPI Malaiya Foister is a 57 year old female with a history of Lynch syndrome, partial hysterectomy, GERD with hiatal hernia, constipation, anxiety, sickle cell trait who is here for follow-up.  She was last seen on 04/26/2023 by Doug Sou, PA-C to evaluate atypical chest pain with globus sensation.  At that visit she had been on pantoprazole 40 mg twice daily but then down to once daily but with inconsistent use.  Shanda Bumps suggested she resume pantoprazole once daily and follow back up with [place] which she is doing today.  She is up-to-date with her EGD and colonoscopy performed in January 2024 EGD small hiatal hernia and small gastric ulcer; biopsy negative for H. Pylori Colonoscopy was normal  She reports that she has continued to have intermittent epigastric pressure she is also noticing some throat clearing and hoarseness.  Sometimes after eating a true heartburn and belching type symptoms.  The pantoprazole has been helpful but not completely.  She also notes constipation and she can go 2 to 3 days between bowel movements.  She tried Linzess but this caused bloating and so she has stopped it.  She does have some globus sensation as well.  No blood in stool or melena  Review of Systems As per HPI, otherwise negative  Current Medications, Allergies, Past Medical History, Past Surgical History, Family History and Social History were reviewed in [employer] record.    Objective:   Physical Exam BP 120/74   Pulse 79   Ht 5\' 5"  (1.651 m)   Wt 180 lb 6.4 oz (81.8 kg)   LMP 10/05/2012   SpO2 98%   BMI 30.02 kg/m  Gen: awake, alert, NAD HEENT: anicteric  Abd: soft, NT/ND, +BS throughout Ext: no c/c/e Neuro: nonfocal     Latest Ref Rng & Units 12/22/2022    2:19 PM 11/25/2022    5:40 AM 11/19/2022   11:49 AM  CBC  WBC 4.0 - 10.5 K/uL 4.9  5.3  3.9   Hemoglobin 12.0 - 15.0  g/dL 40.9  81.1  91.4   Hematocrit 36.0 - 46.0 % 46.2  39.4  41.6   Platelets 150.0 - 400.0 K/uL 264.0  220  213.0    CMP     Component Value Date/Time   NA 143 04/04/2023 0000   K 3.9 04/04/2023 0000   CL 104 04/04/2023 0000   CO2 20 04/04/2023 0000   GLUCOSE 68 (L) 04/04/2023 0000   GLUCOSE 95 12/22/2022 1419   BUN 10 04/04/2023 0000   CREATININE 0.99 04/04/2023 0000   CALCIUM 9.8 04/04/2023 0000   PROT 8.2 12/22/2022 1419   ALBUMIN 5.0 12/22/2022 1419   AST 19 12/22/2022 1419   ALT 17 12/22/2022 1419   ALKPHOS 57 12/22/2022 1419   BILITOT 0.6 12/22/2022 1419   GFR 57.97 (L) 12/22/2022 1419   EGFR 67 04/04/2023 0000   GFRNONAA 57 (L) 11/25/2022 0533   Lab Results  Component Value Date   VITAMINB12 441 12/22/2022   Mg 2.1     Assessment & Plan:   57 year old female with a history of Lynch syndrome, partial hysterectomy, GERD with hiatal hernia, small gastric ulcer (Jan 2024 without H. Pylori), constipation, anxiety, sickle cell trait who is here for follow-up.    GERD/dyspepsia, throat clearing and globus/chest pressure--symptoms are suspicious for uncontrolled reflux.  This is despite the pantoprazole twice daily.  We discussed symptoms and recommend the following --Two-view chest x-ray --Discontinue pantoprazole --Begin Dexilant 60 mg daily --Has liquid Carafate which she can use twice daily as needed but does not need to take this scheduled --Repeat EGD in [facility]  2.  Chronic constipation --Linzess caused bloating --Discontinue Linzess --Begin Trulance 3 mg daily, samples provided today  3.  Lynch syndrome --screening colonoscopy recommended in January with EGD; other Lynch screening tests recommended per medical genetics  If no improvement consider gallbladder imaging  30 minutes total spent today including patient facing time, coordination of care, reviewing medical history/procedures/pertinent radiology studies, and documentation of the encounter. 9

## 2023-06-06 NOTE — Patient Instructions (Addendum)
   You have been scheduled for a colonoscopy. Please follow written instructions given to you at your visit today.   Please pick up your prep supplies at the pharmacy within the next 1-3 days.  If you use inhalers (even only as needed), please bring them with you on the day of your procedure.  DO NOT TAKE 7 DAYS PRIOR TO TEST- Trulicity (dulaglutide) Ozempic, Wegovy (semaglutide) Mounjaro (tirzepatide) Bydureon Bcise (exanatide extended release)  DO NOT TAKE 1 DAY PRIOR TO YOUR TEST Rybelsus (semaglutide) Adlyxin (lixisenatide) Victoza (liraglutide) Byetta (exanatide) ___________________________________________________________________________   Please start to use the liquid Carafate as needed.  ______________________________________________________  If your blood pressure at your visit was 140/90 or greater, please contact your primary care physician to follow up on this.  _______________________________________________________  If you are age 64 or older, your body mass index should be between 23-30. Your Body mass index is 30.02 kg/m. If this is out of the aforementioned range listed, please consider follow up with your Primary Care Provider.  If you are age 33 or younger, your body mass index should be between 19-25. Your Body mass index is 30.02 kg/m. If this is out of the aformentioned range listed, please consider follow up with your Primary Care Provider.   ________________________________________________________  The [place] providers would like to encourage you to use [facility] to communicate with providers for non-urgent requests or questions.  Due to long hold times on the telephone, sending your provider a message by [facility] may be a faster and more efficient way to get a response.  Please allow 48 business hours for a response.  Please remember that this is for non-urgent requests.  _______________________________________________________ It was a pleasure to see  you today!  Thank you for trusting me with your gastrointestinal care!

## 2023-06-10 ENCOUNTER — Other Ambulatory Visit: Payer: Self-pay

## 2023-06-10 MED ORDER — LANSOPRAZOLE 30 MG PO CPDR: 30.0000 mg | DELAYED_RELEASE_CAPSULE | Freq: Two times a day (BID) | ORAL | 6 refills | Status: DC

## 2023-06-10 NOTE — Telephone Encounter (Signed)
Trial of lansoprazole 30 mg twice daily AC to replace Dexilant which is cost prohibitive Have her let me know if this is helpful

## 2023-06-20 ENCOUNTER — Ambulatory Visit: Payer: Managed Care, Other (non HMO) | Admitting: Internal Medicine

## 2023-06-20 ENCOUNTER — Encounter: Payer: Self-pay | Admitting: Internal Medicine

## 2023-06-20 VITALS — BP 122/78 | HR 76 | Temp 98.1°F | Ht 65.0 in | Wt 182.0 lb

## 2023-06-20 DIAGNOSIS — Z0000 Encounter for general adult medical examination without abnormal findings: Secondary | ICD-10-CM | POA: Diagnosis not present

## 2023-06-20 DIAGNOSIS — R739 Hyperglycemia, unspecified: Secondary | ICD-10-CM

## 2023-06-20 DIAGNOSIS — F419 Anxiety disorder, unspecified: Secondary | ICD-10-CM

## 2023-06-20 DIAGNOSIS — E559 Vitamin D deficiency, unspecified: Secondary | ICD-10-CM

## 2023-06-20 DIAGNOSIS — E7800 Pure hypercholesterolemia, unspecified: Secondary | ICD-10-CM

## 2023-06-20 DIAGNOSIS — M25561 Pain in right knee: Secondary | ICD-10-CM | POA: Diagnosis not present

## 2023-06-20 DIAGNOSIS — Z0001 Encounter for general adult medical examination with abnormal findings: Secondary | ICD-10-CM

## 2023-06-20 DIAGNOSIS — E538 Deficiency of other specified B group vitamins: Secondary | ICD-10-CM

## 2023-06-20 DIAGNOSIS — Q2112 Patent foramen ovale: Secondary | ICD-10-CM | POA: Insufficient documentation

## 2023-06-20 LAB — URINALYSIS, ROUTINE W REFLEX MICROSCOPIC
Bilirubin Urine: NEGATIVE
Hgb urine dipstick: NEGATIVE
Ketones, ur: NEGATIVE
Leukocytes,Ua: NEGATIVE
Nitrite: NEGATIVE
RBC / HPF: NONE SEEN (ref 0–?)
Specific Gravity, Urine: 1.01 (ref 1.000–1.030)
Total Protein, Urine: NEGATIVE
Urine Glucose: NEGATIVE
Urobilinogen, UA: 0.2 (ref 0.0–1.0)
WBC, UA: NONE SEEN (ref 0–?)
pH: 7 (ref 5.0–8.0)

## 2023-06-20 LAB — CBC WITH DIFFERENTIAL/PLATELET
Basophils Absolute: 0 10*3/uL (ref 0.0–0.1)
Basophils Relative: 1.1 % (ref 0.0–3.0)
Eosinophils Absolute: 0.2 10*3/uL (ref 0.0–0.7)
Eosinophils Relative: 4 % (ref 0.0–5.0)
HCT: 43.2 % (ref 36.0–46.0)
Hemoglobin: 14.3 g/dL (ref 12.0–15.0)
Lymphocytes Relative: 37.8 % (ref 12.0–46.0)
Lymphs Abs: 1.5 10*3/uL (ref 0.7–4.0)
MCHC: 33.1 g/dL (ref 30.0–36.0)
MCV: 85 fL (ref 78.0–100.0)
Monocytes Absolute: 0.3 10*3/uL (ref 0.1–1.0)
Monocytes Relative: 7.9 % (ref 3.0–12.0)
Neutro Abs: 1.9 10*3/uL (ref 1.4–7.7)
Neutrophils Relative %: 49.2 % (ref 43.0–77.0)
Platelets: 275 10*3/uL (ref 150.0–400.0)
RBC: 5.08 Mil/uL (ref 3.87–5.11)
RDW: 14 % (ref 11.5–15.5)
WBC: 3.9 10*3/uL — ABNORMAL LOW (ref 4.0–10.5)

## 2023-06-20 LAB — LIPID PANEL
Cholesterol: 183 mg/dL (ref 0–200)
HDL: 70.9 mg/dL (ref >39.00)
LDL Cholesterol: 103 mg/dL — ABNORMAL HIGH (ref 0–99)
NonHDL: 111.8
Total CHOL/HDL Ratio: 3
Triglycerides: 46 mg/dL (ref 0.0–149.0)
VLDL: 9.2 mg/dL (ref 0.0–40.0)

## 2023-06-20 LAB — HEPATIC FUNCTION PANEL
ALT: 13 U/L (ref 0–35)
AST: 16 U/L (ref 0–37)
Albumin: 4.6 g/dL (ref 3.5–5.2)
Alkaline Phosphatase: 60 U/L (ref 39–117)
Bilirubin, Direct: 0 mg/dL (ref 0.0–0.3)
Total Bilirubin: 0.4 mg/dL (ref 0.2–1.2)
Total Protein: 7.7 g/dL (ref 6.0–8.3)

## 2023-06-20 LAB — BASIC METABOLIC PANEL
BUN: 15 mg/dL (ref 6–23)
CO2: 29 meq/L (ref 19–32)
Calcium: 10 mg/dL (ref 8.4–10.5)
Chloride: 102 meq/L (ref 96–112)
Creatinine, Ser: 1.14 mg/dL (ref 0.40–1.20)
GFR: 54.14 mL/min — ABNORMAL LOW (ref >60.00)
Glucose, Bld: 83 mg/dL (ref 70–99)
Potassium: 4.1 meq/L (ref 3.5–5.1)
Sodium: 138 meq/L (ref 135–145)

## 2023-06-20 LAB — TSH: TSH: 1.3 u[IU]/mL (ref 0.35–5.50)

## 2023-06-20 LAB — HEMOGLOBIN A1C: Hgb A1c MFr Bld: 5.8 % (ref 4.6–6.5)

## 2023-06-20 NOTE — Patient Instructions (Signed)
Please continue all other medications as before, and refills have been done if requested.  Please have the pharmacy call with any other refills you may need.  Please continue your efforts at being more active, low cholesterol diet, and weight control.  You are otherwise up to date with prevention measures today.  Please keep your appointments with your specialists as you may have planned  Please go to the LAB at the blood drawing area for the tests to be done  You will be contacted by phone if any changes need to be made immediately.  Otherwise, you will receive a letter about your results with an explanation, but please check with MyChart first.  Please make an Appointment to return for your 1 year visit, or sooner if needed, with Lab testing by Appointment as well, to be done about 3-5 days before at the FIRST FLOOR Lab (so this is for TWO appointments - please see the scheduling desk as you leave)

## 2023-06-20 NOTE — Progress Notes (Signed)
The test results show that your current treatment is OK, as the tests are stable.  Please continue the same plan.  There is no other need for change of treatment or further evaluation based on these results, at this time.  thanks 

## 2023-06-20 NOTE — Progress Notes (Unsigned)
Patient ID: [name], female   DOB: Dec 08, 1965, 57 y.o.   MRN: [id number]         Chief Complaint:: wellness exam and lateral right knee pain, hld, low vit d, anxiety       HPI:  [name] is a 57 y.o. female here for wellness exam; declines covld booster, for flu shot and shingrix at [facility], Also sched for Egd and colonoscopy in Aug 15 2023, symptoms improved with PPI and prn carafate.  Pt denies chest pain, increased sob or doe, wheezing, orthopnea, PND, increased LE swelling, palpitations, dizziness or syncope.   Pt denies polydipsia, polyuria, or new focal neuro s/s.   Pt denies fever, wt loss, night sweats, loss of appetite, or other constitutional symptoms  Denies worsening depressive symptoms, suicidal ideation, or panic; has ongoing anxiety, not increased recently.   Does not want statin for hld for now.  Also has severla months of recurring right lateral knee pain, minor pain most of the time, but worse and severe occasionally with standing or twisting the wrong way in doing so  No falls ;  mild swelling off and on now none. .     Wt Readings from Last 3 Encounters:  06/20/23 182 lb (82.6 kg)  06/06/23 180 lb 6.4 oz (81.8 kg)  04/26/23 181 lb (82.1 kg)   BP Readings from Last 3 Encounters:  06/20/23 122/78  06/06/23 120/74  04/26/23 110/80   Immunization History  Administered Date(s) Administered   Influenza Split 04/19/2011   Influenza Whole 04/22/2004   PFIZER(Purple Top)SARS-COV-2 Vaccination 10/25/2019, 11/26/2019   Td 08/15/2008   Tdap 02/02/2019   Unspecified SARS-COV-2 Vaccination 11/26/2019   Health Maintenance Due  Topic Date Due   COVID-19 Vaccine (4 - 2023-24 season) 03/20/2023   Colonoscopy  08/04/2023      Past Medical History:  Diagnosis Date   ACUTE NASOPHARYNGITIS 05/07/2010   Allergic rhinitis, cause unspecified 04/19/2011   Allergy    seasonal   Anemia    History of anemia   Anxiety    BACK PAIN 02/05/2010   CELLULITIS 01/13/2010   CHEST  PAIN UNSPECIFIED 08/23/2008   CONSTIPATION, CHRONIC 04/09/2007   DEEP VENOUS THROMBOPHLEBITIS, LEFT, LEG, HX OF 04/09/2007   DEGENERATIVE DISC DISEASE, 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   DIZZINESS 08/15/2008   DYSPNEA 05/20/2008   FATIGUE 05/20/2008   History of 2019 novel coronavirus disease (COVID-19) 05/23/2019   History of hiatal hernia    HSV infection    Hyperplastic colon polyp    KNEE PAIN, LEFT 09/11/2007   Left sided sciatica 04/19/2011   Lumbar herniated disc    Lynch syndrome    MIGRAINE HEADACHE 04/09/2007   Ovarian cyst    Palpitations 05/20/2008   Pancreatic lesion    PONV (postoperative nausea and vomiting)    PREMATURE VENTRICULAR CONTRACTIONS 08/23/2008   RASH-NONVESICULAR 01/17/2008   RUQ PAIN 02/23/2010   SICKLE CELL TRAIT 04/09/2007   Tension headache 04/09/2007   VARICOSE VEINS LOWER EXTREMITIES W/OTH COMPS 09/11/2007   Wears glasses    Past Surgical History:  Procedure Laterality Date   CESAREAN SECTION  2004   COLONOSCOPY  2016   FINGER FRACTURE SURGERY Left 03/2019   LAPAROSCOPIC ASSISTED VAGINAL HYSTERECTOMY N/A 10/23/2012   Procedure: LAPAROSCOPIC ASSISTED VAGINAL HYSTERECTOMY;  Surgeon: Meriel Pica, MD;  Location: [facility];  Service: Gynecology;  Laterality: N/A;   TONSILLECTOMY  07/18/2009   TUBAL LIGATION  01/06/2012   with cyst removal   UPPER GASTROINTESTINAL  ENDOSCOPY      reports that she has never smoked. She has never used smokeless tobacco. She reports current alcohol use. She reports that she does not use drugs. family history includes Brain cancer in her maternal aunt; Cancer in her cousin and mother; Heart attack in her father; Heart disease in her mother; Hypertension in her mother; Liver cancer in her maternal aunt; Lung cancer in her maternal aunt; Pancreatitis in her father. Allergies  Allergen Reactions   Morphine And Codeine Rash   Current Outpatient Medications on File Prior to Visit  Medication Sig Dispense Refill   fluocinonide  cream (LIDEX) 0.05 % Apply 1 Application topically as needed.     lansoprazole (PREVACID) 30 MG capsule Take 1 capsule (30 mg total) by mouth 2 (two) times daily before a meal. 60 capsule 6   Plecanatide (TRULANCE) 3 MG TABS Take 1 tablet (3 mg total) by mouth daily. 90 tablet 0   sucralfate (CARAFATE) 1 GM/10ML suspension Take 10 mLs (1 g total) by mouth 2 (two) times daily. 420 mL 1   No current facility-administered medications on file prior to visit.        ROS:  All others reviewed and negative.  Objective        PE:  BP 122/78 (BP Location: Right Arm, Patient Position: Sitting, Cuff Size: Normal)   Pulse 76   Temp 98.1 F (36.7 C) (Oral)   Ht 5\' 5"  (1.651 m)   Wt 182 lb (82.6 kg)   LMP 10/05/2012   SpO2 96%   BMI 30.29 kg/m                 Constitutional: Pt appears in NAD               HENT: Head: NCAT.                Right Ear: External ear normal.                 Left Ear: External ear normal.                Eyes: . Pupils are equal, round, and reactive to light. Conjunctivae and EOM are normal               Nose: without d/c or deformity               Neck: Neck supple. Gross normal ROM               Cardiovascular: Normal rate and regular rhythm.                 Pulmonary/Chest: Effort normal and breath sounds without rales or wheezing.                Abd:  Soft, NT, ND, + BS, no organomegaly               Neurological: Pt is alert. At baseline orientation, motor grossly intact               Skin: Skin is warm. No rashes, no other new lesions, LE edema - none               Psychiatric: Pt behavior is normal without agitation   Micro: none  Cardiac tracings I have personally interpreted today:  none  Pertinent Radiological findings (summarize): none   Lab Results  Component Value Date   WBC 3.9 (L) 06/20/2023   HGB 14.3 06/20/2023  HCT 43.2 06/20/2023   PLT 275.0 06/20/2023   GLUCOSE 83 06/20/2023   CHOL 183 06/20/2023   TRIG 46.0 06/20/2023   HDL 70.90  06/20/2023   LDLCALC 103 (H) 06/20/2023   ALT 13 06/20/2023   AST 16 06/20/2023   NA 138 06/20/2023   K 4.1 06/20/2023   CL 102 06/20/2023   CREATININE 1.14 06/20/2023   BUN 15 06/20/2023   CO2 29 06/20/2023   TSH 1.30 06/20/2023   INR 1.0 03/01/2007   HGBA1C 5.8 06/20/2023   Assessment/Plan:  [name] is a 57 y.o. Black or African American [2] female with  has a past medical history of ACUTE NASOPHARYNGITIS (05/07/2010), Allergic rhinitis, cause unspecified (04/19/2011), Allergy, Anemia, Anxiety, BACK PAIN (02/05/2010), CELLULITIS (01/13/2010), CHEST PAIN UNSPECIFIED (08/23/2008), CONSTIPATION, CHRONIC (04/09/2007), DEEP VENOUS THROMBOPHLEBITIS, LEFT, LEG, HX OF (04/09/2007), DEGENERATIVE DISC DISEASE, LUMBAR SPINE (05/12/2007), DIZZINESS (08/15/2008), DYSPNEA (05/20/2008), FATIGUE (05/20/2008), History of 2019 novel coronavirus disease (COVID-19) (05/23/2019), History of hiatal hernia, HSV infection, Hyperplastic colon polyp, KNEE PAIN, LEFT (09/11/2007), Left sided sciatica (04/19/2011), Lumbar herniated disc, Lynch syndrome, MIGRAINE HEADACHE (04/09/2007), Ovarian cyst, Palpitations (05/20/2008), Pancreatic lesion, PONV (postoperative nausea and vomiting), PREMATURE VENTRICULAR CONTRACTIONS (08/23/2008), RASH-NONVESICULAR (01/17/2008), RUQ PAIN (02/23/2010), SICKLE CELL TRAIT (04/09/2007), Tension headache (04/09/2007), VARICOSE VEINS LOWER EXTREMITIES W/OTH COMPS (09/11/2007), and Wears glasses.  Encounter for well adult exam with abnormal findings Age and sex appropriate education and counseling updated with regular exercise and diet Referrals for preventative services - none needed Immunizations addressed - declines all Smoking counseling  - none needed Evidence for depression or other mood disorder - chronic stable anxiety Most recent labs reviewed. 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  Oliver Barre, MD 06/21/2023 5:56 PM [facility]

## 2023-06-21 ENCOUNTER — Encounter: Payer: Self-pay | Admitting: Internal Medicine

## 2023-06-21 DIAGNOSIS — E785 Hyperlipidemia, unspecified: Secondary | ICD-10-CM | POA: Insufficient documentation

## 2023-06-21 DIAGNOSIS — M25561 Pain in right knee: Secondary | ICD-10-CM | POA: Insufficient documentation

## 2023-06-21 NOTE — Assessment & Plan Note (Signed)
Age and sex appropriate education and counseling updated with regular exercise and diet Referrals for preventative services - none needed Immunizations addressed - declines all Smoking counseling  - none needed Evidence for depression or other mood disorder - chronic stable anxiety Most recent labs reviewed. I have personally reviewed and have noted: 1) the patient's medical and social history 2) The patient's current medications and supplements 3) The patient's height, weight, and BMI have been recorded in the chart

## 2023-06-21 NOTE — Assessment & Plan Note (Signed)
Lab Results  Component Value Date   LDLCALC 103 (H) 06/20/2023   Uncontrolled,  for lower chol diet, declines statin

## 2023-06-21 NOTE — Assessment & Plan Note (Signed)
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

## 2023-06-21 NOTE — Assessment & Plan Note (Signed)
Last vitamin D Lab Results  Component Value Date   VD25OH 47.12 12/22/2022   Stable, cont oral replacement

## 2023-06-21 NOTE — Assessment & Plan Note (Signed)
Chronic mild stable, declines need for change in tx at this time

## 2023-06-21 NOTE — Addendum Note (Signed)
Addended by: Corwin Levins on: 06/21/2023 05:59 PM   Modules accepted: Orders

## 2023-06-22 ENCOUNTER — Encounter: Payer: Self-pay | Admitting: Internal Medicine

## 2023-07-14 ENCOUNTER — Other Ambulatory Visit: Payer: Self-pay | Admitting: Gastroenterology

## 2023-07-24 ENCOUNTER — Emergency Department (HOSPITAL_COMMUNITY)
Admission: EM | Admit: 2023-07-24 | Discharge: 2023-07-24 | Disposition: A | Discharge status: Home / Self Care | Payer: Managed Care, Other (non HMO) | Attending: Emergency Medicine | Admitting: Emergency Medicine

## 2023-07-24 ENCOUNTER — Other Ambulatory Visit: Payer: Self-pay

## 2023-07-24 DIAGNOSIS — M5431 Sciatica, right side: Secondary | ICD-10-CM | POA: Insufficient documentation

## 2023-07-24 MED ORDER — PREDNISONE 10 MG PO TABS: ORAL_TABLET | ORAL | 0 refills | Status: AC

## 2023-07-24 MED ORDER — GABAPENTIN 300 MG PO CAPS: 300.0000 mg | ORAL_CAPSULE | Freq: Three times a day (TID) | ORAL | 0 refills | Status: DC

## 2023-07-24 MED ORDER — OXYCODONE-ACETAMINOPHEN 5-325 MG PO TABS: 1.0000 | ORAL_TABLET | Freq: Four times a day (QID) | ORAL | 0 refills | Status: DC | PRN

## 2023-07-24 MED ORDER — HYDROMORPHONE HCL 1 MG/ML IJ SOLN
1.0000 mg | Freq: Once | INTRAMUSCULAR | Status: AC
  Administered 2023-07-24: 1 mg via INTRAMUSCULAR
  Filled 2023-07-24: qty 1

## 2023-07-24 NOTE — Discharge Instructions (Addendum)
 The medications as prescribed to help with your pain and discomfort.  Follow-up with your spine doctor as we discussed for further evaluation

## 2023-07-24 NOTE — ED Triage Notes (Signed)
 Pt c/o lower back pain on her R side that radiates down her leg. Pt states she has hx of sciatica on L side and pain feels similar, but is lasting longer than before. Denies saddle anesthesia. No bowel/bladder incontinence per pt.

## 2023-07-24 NOTE — ED Provider Notes (Signed)
 [place] EMERGENC[facility] DEPARTMENT AT [facility] Provider Note   CSN: [id number] Arrival date & time: 07/24/23  1038     History  Chief Complaint  Patient presents with   Sciatica    [name] is a 58 y.o. female.  HPI   Patient has a history of degenerative disc disease and sciatica.  She presents to the ED for evaluation of back pain.  Patient states she has had trouble with sciatica in the past.  Was however on the left side.  Patient states she started having symptoms several days ago with pain radiating on the right side.  This time it is more intense.  She tried taking gabapentin  that her husband had since it helped for her the last time but it did not help at all.  She is not having any weakness.  No trouble with incontinence.  Home Medications Prior to Admission medications   Medication Sig Start Date End Date Taking? Authorizing Provider  gabapentin  (NEURONTIN ) 300 MG capsule Take 1 capsule (300 mg total) by mouth 3 (three) times daily. 07/24/23  Yes Randol Simmonds, MD  oxyCODONE -acetaminophen  (PERCOCET/ROXICET) 5-325 MG tablet Take 1 tablet by mouth every 6 (six) hours as needed for severe pain (pain score 7-10). 07/24/23  Yes Randol Simmonds, MD  predniSONE  (DELTASONE ) 10 MG tablet Take 6 tablets (60 mg total) by mouth daily with breakfast for 2 days, THEN 5 tablets (50 mg total) daily with breakfast for 2 days, THEN 4 tablets (40 mg total) daily with breakfast for 2 days, THEN 3 tablets (30 mg total) daily with breakfast for 2 days, THEN 2 tablets (20 mg total) daily with breakfast for 2 days, THEN 1 tablet (10 mg total) daily with breakfast for 2 days. 07/24/23 08/05/23 Yes Randol Simmonds, MD  fluocinonide cream (LIDEX) 0.05 % Apply 1 Application topically as needed. 11/18/22   [provider]  lansoprazole  (PREVACID ) 30 MG capsule Take 1 capsule (30 mg total) by mouth 2 (two) times daily before a meal. 06/10/23   Pyrtle, Gordy CHRISTELLA, MD  Plecanatide  (TRULANCE ) 3 MG TABS Take 1  tablet (3 mg total) by mouth daily. 06/06/23 09/04/23  Pyrtle, Gordy CHRISTELLA, MD  sucralfate  (CARAFATE ) 1 GM/10ML suspension TAKE 10 MLS (1 G TOTAL) BY MOUTH 2 (TWO) TIMES DAILY. 07/14/23   Pyrtle, Gordy CHRISTELLA, MD      Allergies    Morphine  and codeine    Review of Systems   Review of Systems  Physical Exam Updated Vital Signs BP 119/80 (BP Location: Right Arm)   Pulse 97   Temp 98.3 F (36.8 C) (Oral)   Resp 14   Ht 1.651 m (5' 5)   Wt 81.6 kg   LMP 10/05/2012   SpO2 100%   BMI 29.95 kg/m  Physical Exam Vitals and nursing note reviewed.  Constitutional:      General: She is not in acute distress.    Appearance: She is well-developed.  HENT:     Head: Normocephalic and atraumatic.     Right Ear: External ear normal.     Left Ear: External ear normal.  Eyes:     General: No scleral icterus.       Right eye: No discharge.        Left eye: No discharge.     Conjunctiva/sclera: Conjunctivae normal.  Neck:     Trachea: No tracheal deviation.  Cardiovascular:     Rate and Rhythm: Normal rate.  Pulmonary:  Effort: Pulmonary effort is normal. No respiratory distress.     Breath sounds: No stridor.  Musculoskeletal:        General: No swelling or deformity.     Cervical back: Neck supple.     Comments: Mild tenderness palpation paraspinal region lumbar spine  Neurological:     General: No focal deficit present.     Mental Status: She is alert.     Cranial Nerves: No dysarthria or facial asymmetry.     Motor: No seizure activity.     Comments: Sensation intact bilateral lower extremities, normal plantarflexion dorsi insurrection strength bilaterally     ED Results / Procedures / Treatments   Labs (all labs ordered are listed, but only abnormal results are displayed) Labs Reviewed - No data to display  EKG None  Radiology No results found.  Procedures Procedures    Medications Ordered in ED Medications  HYDROmorphone  (DILAUDID ) injection 1 mg (has no administration  in time range)    ED Course/ Medical Decision Making/ A&P                                 Medical Decision Making Risk Prescription drug management.   Patient's presentation is consistent with sciatica.  No findings to suggest cauda equina.  No indication for emergent spine imaging.  Patient will be treated with course of pain medications as well as steroids.  She also request gabapentin  which was helpful previously.  Patient has seen Dr. Burnetta in the past.  Discussed outpatient follow-up with a spine surgeon if her symptoms persist        Final Clinical Impression(s) / ED Diagnoses Final diagnoses:  Sciatica of right side    Rx / DC Orders ED Discharge Orders          Ordered    gabapentin  (NEURONTIN ) 300 MG capsule  3 times daily        07/24/23 1402    predniSONE  (DELTASONE ) 10 MG tablet  Q breakfast        07/24/23 1402    oxyCODONE -acetaminophen  (PERCOCET/ROXICET) 5-325 MG tablet  Every 6 hours PRN        07/24/23 1402              Randol Simmonds, MD 07/24/23 1406

## 2023-07-27 DIAGNOSIS — M5416 Radiculopathy, lumbar region: Secondary | ICD-10-CM | POA: Insufficient documentation

## 2023-07-27 DIAGNOSIS — M21371 Foot drop, right foot: Secondary | ICD-10-CM | POA: Insufficient documentation

## 2023-08-01 ENCOUNTER — Ambulatory Visit (AMBULATORY_SURGERY_CENTER): Payer: Managed Care, Other (non HMO)

## 2023-08-01 VITALS — Ht 65.0 in | Wt 180.0 lb

## 2023-08-01 DIAGNOSIS — Z1509 Genetic susceptibility to other malignant neoplasm: Secondary | ICD-10-CM

## 2023-08-01 NOTE — Progress Notes (Signed)

## 2023-08-02 ENCOUNTER — Telehealth: Payer: Self-pay

## 2023-08-02 NOTE — Telephone Encounter (Signed)
 EGD added on for 09/19/23 colonoscopy.  Patient aware and new instructions mailed to patient

## 2023-08-02 NOTE — Telephone Encounter (Signed)
 Patient had pre visit on 08/01/23.  Patient was complaining of pressure and acid reflux.  Patient stated that it is chest pain with globus sensation.  She is scheduled for 09/19/23 for colonoscopy, but would like to have an EGD at the same time.She was seen by Dr. Albertus on 06/06/23 it is stated under plan to do a screening colonoscopy with EGD in January. Will change colonoscopy to a double for patient to complete both at the same visit.

## 2023-08-09 ENCOUNTER — Ambulatory Visit: Payer: Managed Care, Other (non HMO) | Attending: Cardiology | Admitting: Cardiology

## 2023-08-09 ENCOUNTER — Encounter: Payer: Self-pay | Admitting: Cardiology

## 2023-08-09 VITALS — BP 126/90 | HR 61 | Ht 65.0 in | Wt 179.6 lb

## 2023-08-09 DIAGNOSIS — Q2112 Patent foramen ovale: Secondary | ICD-10-CM | POA: Diagnosis not present

## 2023-08-09 NOTE — Patient Instructions (Signed)
Medication Instructions:  Your physician recommends that you continue on your current medications as directed. Please refer to the Current Medication list given to you today.  *If you need a refill on your cardiac medications before your next appointment, please call your pharmacy*  Follow-Up: At [facility], you and your health needs are our priority.  As part of our continuing mission to provide you with exceptional heart care, we have created designated Provider Care Teams.  These Care Teams include your primary Cardiologist (physician) and Advanced Practice Providers (APPs -  Physician Assistants and Nurse Practitioners) who all work together to provide you with the care you need, when you need it.  Your next appointment:   6 month(s)  Provider:   Thomasene Ripple, DO     Other Instructions:

## 2023-08-09 NOTE — Progress Notes (Signed)
Cardiology Office Note:    Date:  08/09/2023   ID:  [name], DOB [birth year], MRN [id number]  PCP:  Corwin Levins, MD  Cardiologist:  Thomasene Ripple, DO  Electrophysiologist:  None   Referring MD: Corwin Levins, MD   " I am having chest pain"   History of Present Illness:    [name] is a 58 y.o. female with a hx of tiny PFO which was recently seen on coronary CTA, at her visit in September 2024 she was experiencing chest discomfort.  Given her family history of premature CAD and send patient for coronary CTA.  This was normal with no evidence of coronary artery disease.  She tells me that there is suspicion for GERD and has been treated by GI.  She is planned for endoscopy but this has been pushed back to March given her new onset of acute back pain.   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   DIZZINESS 08/15/2008   DYSPNEA 05/20/2008   FATIGUE 05/20/2008   History of 2019 novel coronavirus disease (COVID-19) 05/23/2019   History of hiatal hernia    HSV infection    Hyperplastic colon polyp    KNEE PAIN, LEFT 09/11/2007   Left sided sciatica 04/19/2011   Lumbar herniated disc    Lynch syndrome    MIGRAINE HEADACHE 04/09/2007   Ovarian cyst    Palpitations 05/20/2008   Pancreatic lesion    PONV (postoperative nausea and vomiting)    PREMATURE VENTRICULAR CONTRACTIONS 08/23/2008   RASH-NONVESICULAR 01/17/2008   RUQ PAIN 02/23/2010   SICKLE CELL TRAIT 04/09/2007   Tension headache 04/09/2007   VARICOSE VEINS LOWER EXTREMITIES W/OTH COMPS 09/11/2007   Wears glasses     Past Surgical History:   Procedure Laterality Date   CESAREAN SECTION  2004   COLONOSCOPY  2016   FINGER FRACTURE SURGERY Left 03/2019   LAPAROSCOPIC ASSISTED VAGINAL HYSTERECTOMY N/A 10/23/2012   Procedure: LAPAROSCOPIC ASSISTED VAGINAL HYSTERECTOMY;  Surgeon: Meriel Pica, MD;  Location: [facility];  Service: Gynecology;  Laterality: N/A;   TONSILLECTOMY  07/18/2009   TUBAL LIGATION  01/06/2012   with cyst removal   UPPER GASTROINTESTINAL ENDOSCOPY      Current Medications: Current Meds  Medication Sig   fluocinonide cream (LIDEX) 0.05 % Apply 1 Application topically as needed.   gabapentin (NEURONTIN) 300 MG capsule Take 1 capsule (300 mg total) by mouth 3 (three) times daily.   oxyCODONE-acetaminophen (PERCOCET/ROXICET) 5-325 MG tablet Take 1 tablet by mouth every 6 (six) hours as needed for severe pain (pain score 7-10).   Plecanatide (TRULANCE) 3 MG TABS Take 1 tablet (3 mg total) by mouth daily.   sucralfate (CARAFATE) 1 GM/10ML suspension TAKE 10 MLS (1 G TOTAL) BY MOUTH 2 (TWO) TIMES DAILY.     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     Employer: [employer]   Occupation: currently does not work outside the home (12/06/19)  Tobacco Use   Smoking status: Never   Smokeless tobacco: Never  Vaping Use   Vaping status: Never Used  Substance and Sexual Activity   Alcohol use: Yes    Comment: socially rare every 1-2 months   Drug use: No   Sexual activity: Not on file  Other Topics Concern   Not on file  Social History Narrative   Not on file   Social Drivers of Health   Financial Resource Strain: Low Risk  (06/20/2023)   Overall Financial Resource Strain (CARDIA)    Difficulty of Paying Living Expenses: Not hard at all  Food  Insecurity: No Food Insecurity (06/20/2023)   Hunger Vital Sign    Worried About Running Out of Food in the Last Year: Never true    Ran Out of Food in the Last Year: Never true  Transportation Needs: No Transportation Needs (06/20/2023)   PRAPARE - Administrator, Civil Service (Medical): No    Lack of Transportation (Non-Medical): No  Physical Activity: Unknown (06/20/2023)   Exercise Vital Sign    Days of Exercise per Week: Patient declined    Minutes of Exercise per Session: 30 min  Stress: No Stress Concern Present (06/20/2023)   [employer] of Occupational Health - Occupational Stress Questionnaire    Feeling of Stress : Not at all  Social Connections: Moderately Integrated (06/20/2023)   Social Connection and Isolation Panel [NHANES]    Frequency of Communication with Friends and Family: More than three times a week    Frequency of Social Gatherings with Friends and Family: Twice a week    Attends Religious Services: More than 4 times per year    Active Member of [employer] or Organizations: No    Attends Engineer, structural: Not on file    Marital Status: Married     Family History: The patient's family history includes Brain cancer in her maternal aunt; Cancer in her cousin and mother; Heart attack in her father; Heart disease in her mother; Hypertension in her mother; Liver cancer in her maternal aunt; Lung cancer in her maternal aunt; Pancreatitis in her father. There is no history of Colon cancer, Colon polyps, Esophageal cancer, Rectal cancer, or Stomach cancer.  ROS:   Review of Systems  Constitution: Negative for decreased appetite, fever and weight gain.  HENT: Negative for congestion, ear discharge, hoarse voice and sore throat.   Eyes: Negative for discharge, redness, vision loss in right eye and visual halos.  Cardiovascular: Negative for chest pain, dyspnea on exertion, leg swelling, orthopnea and palpitations.  Respiratory: Negative for cough,  hemoptysis, shortness of breath and snoring.   Endocrine: Negative for heat intolerance and polyphagia.  Hematologic/Lymphatic: Negative for bleeding problem. Does not bruise/bleed easily.  Skin: Negative for flushing, nail changes, rash and suspicious lesions.  Musculoskeletal: Negative for arthritis, joint pain, muscle cramps, myalgias, neck pain and stiffness.  Gastrointestinal: Negative for abdominal pain, bowel incontinence, diarrhea and excessive appetite.  Genitourinary: Negative for decreased libido, genital sores and incomplete emptying.  Neurological: Negative for brief paralysis, focal weakness, headaches and loss of balance.  Psychiatric/Behavioral: Negative for altered mental status, depression and suicidal ideas.  Allergic/Immunologic: Negative for HIV exposure and persistent infections.    EKGs/Labs/Other Studies Reviewed:    The following studies were reviewed today:   EKG:  The ekg ordered  today demonstrates sinus rhythm, heart rate 75 bpm.  Recent Labs: 04/04/2023: Magnesium 2.1 06/20/2023: ALT 13; BUN 15; Creatinine, Ser 1.14; Hemoglobin 14.3; Platelets 275.0; Potassium 4.1; Sodium 138; TSH 1.30  Recent Lipid Panel    Component Value Date/Time   CHOL 183 06/20/2023 0857   TRIG 46.0 06/20/2023 0857   HDL 70.90 06/20/2023 0857   CHOLHDL 3 06/20/2023 0857   VLDL 9.2 06/20/2023 0857   LDLCALC 103 (H) 06/20/2023 0857    Physical Exam:    VS:  BP (!) 126/90 (BP Location: Left Arm, Patient Position: Sitting, Cuff Size: Normal)   Pulse 61   Ht 5\' 5"  (1.651 m)   Wt 179 lb 9.6 oz (81.5 kg)   LMP 10/05/2012   SpO2 90%   BMI 29.89 kg/m     Wt Readings from Last 3 Encounters:  08/09/23 179 lb 9.6 oz (81.5 kg)  08/01/23 180 lb (81.6 kg)  07/24/23 180 lb (81.6 kg)     GEN: Well nourished, well developed in no acute distress HEENT: Normal NECK: No JVD; No carotid bruits LYMPHATICS: No lymphadenopathy CARDIAC: S1S2 noted,RRR, no murmurs, rubs,  gallops RESPIRATORY:  Clear to auscultation without rales, wheezing or rhonchi  ABDOMEN: Soft, non-tender, non-distended, +bowel sounds, no guarding. EXTREMITIES: No edema, No cyanosis, no clubbing MUSCULOSKELETAL:  No deformity  SKIN: Warm and dry NEUROLOGIC:  Alert and oriented x 3, non-focal PSYCHIATRIC:  Normal affect, good insight  ASSESSMENT:    1. PFO (patent foramen ovale)     PLAN:    From a cardiovascular standpoint.  She does have a tiny PFO.  She had questions about prophylaxis aspirin at this time this is not indicated for this patient.  Will continue to follow with her  The patient is in agreement with the above plan. The patient left the office in stable condition.  The patient will follow up in   Medication Adjustments/Labs and Tests Ordered: Current medicines are reviewed at length with the patient today.  Concerns regarding medicines are outlined above.  No orders of the defined types were placed in this encounter.  No orders of the defined types were placed in this encounter.   Patient Instructions  Medication Instructions:  Your physician recommends that you continue on your current medications as directed. Please refer to the Current Medication list given to you today.  *If you need a refill on your cardiac medications before your next appointment, please call your pharmacy*  Follow-Up: At [facility], you and your health needs are our priority.  As part of our continuing mission to provide you with exceptional heart care, we have created designated Provider Care Teams.  These Care Teams include your primary Cardiologist (physician) and Advanced Practice Providers (APPs -  Physician Assistants and Nurse Practitioners) who all work together to provide you with the care you need, when you need it.  Your next appointment:   6 month(s)  Provider:   Thomasene Ripple, DO     Other Instructions:     Adopting a Healthy Lifestyle.  Know what a healthy  weight is for you (roughly BMI <25) and aim to maintain this   Aim for 7+ servings of fruits and vegetables daily   65-80+ fluid ounces of water or unsweet tea for healthy kidneys   Limit to max 1 drink of alcohol per day; avoid smoking/tobacco   Limit animal fats in diet for cholesterol and heart health - choose grass fed whenever available   Avoid highly processed  foods, and foods high in saturated/trans fats   Aim for low stress - take time to unwind and care for your mental health   Aim for 150 min of moderate intensity exercise weekly for heart health, and weights twice weekly for bone health   Aim for 7-9 hours of sleep daily   When it comes to diets, agreement about the perfect plan isnt easy to find, even among the experts. Experts at the [facility] [employer] developed an idea known as the Healthy Eating Plate. Just imagine a plate divided into logical, healthy portions.   The emphasis is on diet quality:   Load up on vegetables and fruits - one-half of your plate: Aim for color and variety, and remember that potatoes dont count.   Go for whole grains - one-quarter of your plate: Whole wheat, barley, wheat berries, quinoa, oats, brown rice, and foods made with them. If you want pasta, go with whole wheat pasta.   Protein power - one-quarter of your plate: Fish, chicken, beans, and nuts are all healthy, versatile protein sources. Limit red meat.   The diet, however, does go beyond the plate, offering a few other suggestions.   Use healthy plant oils, such as olive, canola, soy, corn, sunflower and peanut. Check the labels, and avoid partially hydrogenated oil, which have unhealthy trans fats.   If youre thirsty, drink water. Coffee and tea are good in moderation, but skip sugary drinks and limit milk and dairy products to one or two daily servings.   The type of carbohydrate in the diet is more important than the amount. Some sources of carbohydrates, such as  vegetables, fruits, whole grains, and beans-are healthier than others.   Finally, stay active  Signed, Thomasene Ripple, DO  08/09/2023 11:41 AM    [facility]

## 2023-08-15 ENCOUNTER — Encounter: Payer: Managed Care, Other (non HMO) | Admitting: Internal Medicine

## 2023-08-28 ENCOUNTER — Ambulatory Visit
Admission: EM | Admit: 2023-08-28 | Discharge: 2023-08-28 | Disposition: A | Discharge status: Home / Self Care | Payer: Managed Care, Other (non HMO)

## 2023-08-28 DIAGNOSIS — J111 Influenza due to unidentified influenza virus with other respiratory manifestations: Secondary | ICD-10-CM

## 2023-08-28 LAB — POCT INFLUENZA A/B
Influenza A, POC: POSITIVE — AB
Influenza B, POC: NEGATIVE

## 2023-08-28 MED ORDER — BENZONATATE 100 MG PO CAPS: 100.0000 mg | ORAL_CAPSULE | Freq: Four times a day (QID) | ORAL | 0 refills | Status: DC | PRN

## 2023-08-28 NOTE — Discharge Instructions (Signed)
 Follow up with your Physician for recheck

## 2023-08-28 NOTE — ED Triage Notes (Signed)
 Symptoms onset 5 days ago. No SOB but chest is hurting from congestion and cough.

## 2023-08-28 NOTE — ED Provider Notes (Signed)
 [facility]    CSN: [id number] Arrival date & time: 08/28/23  0804      History   Chief Complaint Chief Complaint  Patient presents with   Chills   Generalized Body Aches   Headache   Fever    HPI [name] is a 58 y.o. female.   Patient complains of a cough and congestion.  Patient reports that she began feeling sick 5 days ago.  Patient reports that she is no longer running a fever but she still has a cough.  Patient does not believe she has any exposures to flu or COVID.  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   DIZZINESS 08/15/2008   DYSPNEA 05/20/2008   FATIGUE 05/20/2008   History of 2019 novel coronavirus disease (COVID-19) 05/23/2019   History of hiatal hernia    HSV infection    Hyperplastic colon polyp    KNEE PAIN, LEFT 09/11/2007   Left sided sciatica 04/19/2011   Lumbar herniated disc    Lynch syndrome    MIGRAINE HEADACHE 04/09/2007   Ovarian cyst    Palpitations 05/20/2008   Pancreatic lesion    PONV (postoperative nausea and vomiting)    PREMATURE VENTRICULAR CONTRACTIONS 08/23/2008   RASH-NONVESICULAR 01/17/2008   RUQ PAIN 02/23/2010   SICKLE CELL TRAIT 04/09/2007   Tension headache 04/09/2007   VARICOSE VEINS LOWER EXTREMITIES W/OTH COMPS 09/11/2007   Wears glasses     Patient Active Problem List   Diagnosis Date Noted   HLD (hyperlipidemia) 06/21/2023   Right knee pain 06/21/2023   Encounter for well adult exam with  abnormal findings 06/20/2023   PFO (patent foramen ovale) 06/20/2023   Gastroesophageal reflux disease without esophagitis 04/26/2023   Anxiety 01/13/2023   Syncope 01/10/2023   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   Neoplasm of uncertain behavior of skin 12/03/2021   PHN (postherpetic neuralgia) 07/05/2021   Eustachian tube dysfunction, bilateral 05/10/2021   Vitamin D  deficiency 05/10/2021   Lynch syndrome 05/01/2021   Insomnia 12/02/2020   Venous (peripheral) insufficiency 12/18/2017   Adenomyosis 10/24/2012   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   PREMATURE VENTRICULAR CONTRACTIONS 08/23/2008   Non-cardiac chest pain 08/23/2008   Lightheadedness 08/15/2008   FATIGUE 05/20/2008   TONSILLITIS, CHRONIC 03/13/2008   DEGENERATIVE DISC DISEASE, 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   SICKLE CELL TRAIT 04/09/2007   Migraine headache 04/09/2007    Past Surgical History:  Procedure Laterality Date   CESAREAN SECTION  2004   COLONOSCOPY  2016   FINGER FRACTURE SURGERY Left 03/2019   LAPAROSCOPIC ASSISTED VAGINAL HYSTERECTOMY N/A 10/23/2012   Procedure: LAPAROSCOPIC ASSISTED VAGINAL HYSTERECTOMY;  Surgeon: Charlie CHRISTELLA Croak, MD;  Location: [facility];  Service: Gynecology;  Laterality: N/A;   TONSILLECTOMY  07/18/2009   TUBAL LIGATION  01/06/2012   with cyst removal   UPPER GASTROINTESTINAL ENDOSCOPY      OB History     Gravida  3   Para  3   Term  Preterm      AB      Living  3      SAB      IAB      Ectopic      Multiple      Live Births               Home Medications    Prior to Admission medications   Medication Sig Start Date End Date Taking? Authorizing Provider  benzonatate  (TESSALON  PERLES) 100 MG capsule Take 1 capsule (100 mg total) by mouth every 6 (six) hours as needed for cough. 08/28/23 08/27/24 Yes Jessup Ogas K, PA-C  pantoprazole  (PROTONIX ) 40 MG tablet Take 40 mg by mouth as needed. 08/10/23  Yes [provider]  fluocinonide cream (LIDEX)  0.05 % Apply 1 Application topically as needed. 11/18/22   [provider]  gabapentin  (NEURONTIN ) 300 MG capsule Take 1 capsule (300 mg total) by mouth 3 (three) times daily. 07/24/23   Randol Simmonds, MD  lansoprazole  (PREVACID ) 30 MG capsule Take 1 capsule (30 mg total) by mouth 2 (two) times daily before a meal. 06/10/23   Pyrtle, Gordy HERO, MD  oxyCODONE -acetaminophen  (PERCOCET/ROXICET) 5-325 MG tablet Take 1 tablet by mouth every 6 (six) hours as needed for severe pain (pain score 7-10). 07/24/23   Randol Simmonds, MD  Plecanatide  (TRULANCE ) 3 MG TABS Take 1 tablet (3 mg total) by mouth daily. 06/06/23 09/04/23  Pyrtle, Gordy HERO, MD  sucralfate  (CARAFATE ) 1 GM/10ML suspension TAKE 10 MLS (1 G TOTAL) BY MOUTH 2 (TWO) TIMES DAILY. 07/14/23   Pyrtle, Gordy HERO, MD    Family History Family History  Problem Relation Age of Onset   Heart disease Mother    Hypertension Mother    Cancer Mother        ovarian    Heart attack Father    Pancreatitis Father    Liver cancer Maternal Aunt    Brain cancer Maternal Aunt    Lung cancer Maternal Aunt    Cancer Cousin        breast   Colon cancer Neg Hx    Colon polyps Neg Hx    Esophageal cancer Neg Hx    Rectal cancer Neg Hx    Stomach cancer Neg Hx     Social History Social History   Tobacco Use   Smoking status: Never   Smokeless tobacco: Never  Vaping Use   Vaping status: Never Used  Substance Use Topics   Alcohol use: Yes    Comment: socially rare every 1-2 months   Drug use: No     Allergies   Morphine    Review of Systems Review of Systems  Constitutional:  Positive for fever.  Neurological:  Positive for headaches.  All other systems reviewed and are negative.    Physical Exam Triage Vital Signs ED Triage Vitals  Encounter Vitals Group     BP 08/28/23 0831 (!) 126/94     Systolic BP Percentile --      Diastolic BP Percentile --      Pulse Rate 08/28/23 0831 87     Resp 08/28/23 0831 18     Temp 08/28/23 0831 97.7 F (36.5  C)     Temp Source 08/28/23 0831 Oral     SpO2 08/28/23 0831 96 %     Weight --      Height --      Head Circumference --      Peak Flow --  Pain Score 08/28/23 0829 5     Pain Loc --      Pain Education --      Exclude from Growth Chart --    No data found.  Updated Vital Signs BP (!) 126/94 (BP Location: Left Arm)   Pulse 87   Temp 97.7 F (36.5 C) (Oral)   Resp 18   LMP 10/05/2012   SpO2 96%   Visual Acuity Right Eye Distance:   Left Eye Distance:   Bilateral Distance:    Right Eye Near:   Left Eye Near:    Bilateral Near:     Physical Exam Vitals and nursing note reviewed.  Constitutional:      Appearance: She is well-developed.  HENT:     Head: Normocephalic.  Cardiovascular:     Rate and Rhythm: Normal rate.  Pulmonary:     Effort: Pulmonary effort is normal.  Abdominal:     General: There is no distension.  Musculoskeletal:        General: Normal range of motion.     Cervical back: Normal range of motion.  Skin:    General: Skin is warm.  Neurological:     General: No focal deficit present.     Mental Status: She is alert and oriented to person, place, and time.      UC Treatments / Results  Labs (all labs ordered are listed, but only abnormal results are displayed) Labs Reviewed  POCT INFLUENZA A/B - Abnormal; Notable for the following components:      Result Value   Influenza A, POC Positive (*)    All other components within normal limits    EKG   Radiology No results found.  Procedures Procedures (including critical care time)  Medications Ordered in UC Medications - No data to display  Initial Impression / Assessment and Plan / UC Course  I have reviewed the triage vital signs and the nursing notes.  Pertinent labs & imaging results that were available during my care of the patient were reviewed by me and considered in my medical decision making (see chart for details).     COVID a positive Final Clinical  Impressions(s) / UC Diagnoses   Final diagnoses:  Influenza with respiratory manifestation     Discharge Instructions      Follow up with your Physician for recheck    ED Prescriptions     Medication Sig Dispense Auth. Provider   benzonatate  (TESSALON  PERLES) 100 MG capsule Take 1 capsule (100 mg total) by mouth every 6 (six) hours as needed for cough. 30 capsule Senetra Dillin K, PA-C      PDMP not reviewed this encounter. An After Visit Summary was printed and given to the patient.        Flint Sonny POUR, [place] 08/28/23 [phone]

## 2023-09-05 ENCOUNTER — Encounter: Payer: Self-pay | Admitting: Internal Medicine

## 2023-09-15 ENCOUNTER — Encounter: Payer: Self-pay | Admitting: Internal Medicine

## 2023-09-19 ENCOUNTER — Ambulatory Visit (AMBULATORY_SURGERY_CENTER): Payer: Managed Care, Other (non HMO) | Admitting: Internal Medicine

## 2023-09-19 ENCOUNTER — Encounter: Payer: Self-pay | Admitting: Internal Medicine

## 2023-09-19 VITALS — BP 110/86 | HR 77 | Temp 98.7°F | Resp 16 | Ht 65.0 in | Wt 180.0 lb

## 2023-09-19 DIAGNOSIS — Z1509 Genetic susceptibility to other malignant neoplasm: Secondary | ICD-10-CM | POA: Diagnosis not present

## 2023-09-19 DIAGNOSIS — K449 Diaphragmatic hernia without obstruction or gangrene: Secondary | ICD-10-CM

## 2023-09-19 DIAGNOSIS — K219 Gastro-esophageal reflux disease without esophagitis: Secondary | ICD-10-CM

## 2023-09-19 DIAGNOSIS — Z1211 Encounter for screening for malignant neoplasm of colon: Secondary | ICD-10-CM | POA: Diagnosis present

## 2023-09-19 MED ORDER — SODIUM CHLORIDE 0.9 % IV SOLN: 500.0000 mL | Freq: Once | INTRAVENOUS | Status: DC

## 2023-09-19 NOTE — Progress Notes (Unsigned)
 GASTROENTEROLOGY PROCEDURE H&P NOTE   Primary Care Physician: Corwin Levins, MD    Reason for Procedure:   Lynch syndrome   Plan:    Egd/colonoscopy  Patient is appropriate for endoscopic procedure(s) in the ambulatory ([facility]) setting.  The nature of the procedure, as well as the risks, benefits, and alternatives were carefully and thoroughly reviewed with the patient. Ample time for discussion and questions allowed. The patient understood, was satisfied, and agreed to proceed.     HPI: [name] is a 58 y.o. female who presents for egd and colonoscopy.  Medical history as below.  Tolerated the prep.  No recent chest pain or shortness of breath.  No abdominal pain today.  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   DIZZINESS 08/15/2008   DYSPNEA 05/20/2008   FATIGUE 05/20/2008   History of 2019 novel coronavirus disease (COVID-19) 05/23/2019   History of hiatal hernia    HSV infection    Hyperplastic colon polyp    KNEE PAIN, LEFT 09/11/2007   Left sided sciatica 04/19/2011   Lumbar herniated disc    Lynch syndrome    MIGRAINE HEADACHE 04/09/2007   Ovarian cyst    Palpitations 05/20/2008   Pancreatic lesion    PONV (postoperative nausea and vomiting)    PREMATURE VENTRICULAR CONTRACTIONS 08/23/2008   RASH-NONVESICULAR 01/17/2008   RUQ PAIN 02/23/2010   SICKLE CELL TRAIT 04/09/2007   Tension headache 04/09/2007   VARICOSE VEINS LOWER EXTREMITIES W/OTH COMPS 09/11/2007   Wears glasses     Past Surgical History:  Procedure Laterality Date   CESAREAN SECTION  2004   COLONOSCOPY  2016   FINGER  FRACTURE SURGERY Left 03/2019   LAPAROSCOPIC ASSISTED VAGINAL HYSTERECTOMY N/A 10/23/2012   Procedure: LAPAROSCOPIC ASSISTED VAGINAL HYSTERECTOMY;  Surgeon: Meriel Pica, MD;  Location: [facility];  Service: Gynecology;  Laterality: N/A;   TONSILLECTOMY  07/18/2009   TUBAL LIGATION  01/06/2012   with cyst removal   UPPER GASTROINTESTINAL ENDOSCOPY      Prior to Admission medications   Medication Sig Start Date End Date Taking? Authorizing Provider  oxyCODONE-acetaminophen (PERCOCET/ROXICET) 5-325 MG tablet Take 1 tablet by mouth every 6 (six) hours as needed for severe pain (pain score 7-10). 07/24/23  Yes Linwood Dibbles, MD  fluocinonide cream (LIDEX) 0.05 % Apply 1 Application topically as needed. 11/18/22   [provider]  gabapentin (NEURONTIN) 300 MG capsule Take 1 capsule (300 mg total) by mouth 3 (three) times daily. 07/24/23   Linwood Dibbles, MD  pantoprazole (PROTONIX) 40 MG tablet Take 40 mg by mouth as needed. 08/10/23   [provider]  sucralfate (CARAFATE) 1 GM/10ML suspension TAKE 10 MLS (1 G TOTAL) BY MOUTH 2 (TWO) TIMES DAILY. 07/14/23   Stalin Gruenberg, Carie Caddy, MD    Current Outpatient Medications  Medication Sig Dispense Refill   oxyCODONE-acetaminophen (PERCOCET/ROXICET) 5-325 MG tablet Take 1 tablet by mouth every 6 (six) hours as needed for severe pain (pain score 7-10). 10 tablet 0   fluocinonide cream (LIDEX) 0.05 % Apply 1 Application topically as needed.     gabapentin (NEURONTIN) 300  MG capsule Take 1 capsule (300 mg total) by mouth 3 (three) times daily. 90 capsule 0   pantoprazole (PROTONIX) 40 MG tablet Take 40 mg by mouth as needed.     sucralfate (CARAFATE) 1 GM/10ML suspension TAKE 10 MLS (1 G TOTAL) BY MOUTH 2 (TWO) TIMES DAILY. 420 mL 1   Current Facility-Administered Medications  Medication Dose Route Frequency Provider Last Rate Last Admin   0.9 %  sodium chloride infusion  500 mL Intravenous Once Tien Spooner, Carie Caddy, MD        Allergies as of 09/19/2023 - Review  Complete 09/19/2023  Allergen Reaction Noted   Morphine Hives 10/21/2014    Family History  Problem Relation Age of Onset   Heart disease Mother    Hypertension Mother    Cancer Mother        ovarian    Heart attack Father    Pancreatitis Father    Liver cancer Maternal Aunt    Brain cancer Maternal Aunt    Lung cancer Maternal Aunt    Cancer Cousin        breast   Colon cancer Neg Hx    Colon polyps Neg Hx    Esophageal cancer Neg Hx    Rectal cancer Neg Hx    Stomach cancer Neg Hx     Social History   Socioeconomic History   Marital status: Married    Spouse name: Not on file   Number of children: 3   Years of education: Not on file   Highest education level: Some college, no degree  Occupational History   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     Employer: [employer]   Occupation: currently does not work outside the home (12/06/19)  Tobacco Use   Smoking status: Never   Smokeless tobacco: Never  Vaping Use   Vaping status: Never Used  Substance and Sexual Activity   Alcohol use: Yes    Comment: socially rare every 1-2 months   Drug use: No   Sexual activity: Not on file  Other Topics Concern   Not on file  Social History Narrative   Not on file   Social Drivers of Health   Financial Resource Strain: Low Risk  (06/20/2023)   Overall Financial Resource Strain (CARDIA)    Difficulty of Paying Living Expenses: Not hard at all  Food Insecurity: No Food Insecurity (06/20/2023)   Hunger Vital Sign    Worried About Running Out of Food in the Last Year: Never true    Ran Out of Food in the Last Year: Never true  Transportation Needs: No Transportation Needs (06/20/2023)   PRAPARE - Administrator, Civil Service (Medical): No    Lack of Transportation (Non-Medical): No  Physical Activity: Unknown (06/20/2023)   Exercise Vital Sign    Days of Exercise per Week: Patient declined    Minutes of Exercise per Session: 30 min  Stress: No  Stress Concern Present (06/20/2023)   [employer] of Occupational Health - Occupational Stress Questionnaire    Feeling of Stress : Not at all  Social Connections: Moderately Integrated (06/20/2023)   Social Connection and Isolation Panel [NHANES]    Frequency of Communication with Friends and Family: More than three times a week    Frequency of Social Gatherings with Friends and Family: Twice a week    Attends Religious Services: More than 4 times per year    Active Member of Clubs or Organizations: No  Attends Banker Meetings: Not on file    Marital Status: Married  Intimate Partner Violence: Unknown (10/19/2021)   Received from [facility], [facility]   HITS    Physically Hurt: Not on file    Insult or Talk Down To: Not on file    Threaten Physical Harm: Not on file    Scream or Curse: Not on file    Physical Exam: Vital signs in last 24 hours: @BP  (!) 142/86   Pulse 87   Temp 98.7 F (37.1 C) (Temporal)   Ht 5\' 5"  (1.651 m)   Wt 180 lb (81.6 kg)   LMP 10/05/2012   SpO2 100%   BMI 29.95 kg/m  GEN: NAD EYE: Sclerae anicteric ENT: MMM CV: Non-tachycardic Pulm: CTA b/l GI: Soft, NT/ND NEURO:  Alert & Oriented x 3   Erick Blinks, MD [facility]  09/19/2023 3:09 PM

## 2023-09-19 NOTE — Patient Instructions (Signed)
 -Handout on hiatal hernia  provided -await pathology results -repeat colonoscopy in 2 years for surveillance recommended. Repeat endoscopy in 3 years for screening  -Continue present medications   YOU HAD AN ENDOSCOPIC PROCEDURE TODAY AT THE [facility]:   Refer to the procedure report that was given to you for any specific questions about what was found during the examination.  If the procedure report does not answer your questions, please call your gastroenterologist to clarify.  If you requested that your care partner not be given the details of your procedure findings, then the procedure report has been included in a sealed envelope for you to review at your convenience later.  YOU SHOULD EXPECT: Some feelings of bloating in the abdomen. Passage of more gas than usual.  Walking can help get rid of the air that was put into your GI tract during the procedure and reduce the bloating. If you had a lower endoscopy (such as a colonoscopy or flexible sigmoidoscopy) you may notice spotting of blood in your stool or on the toilet paper. If you underwent a bowel prep for your procedure, you may not have a normal bowel movement for a few days.  Please Note:  You might notice some irritation and congestion in your nose or some drainage.  This is from the oxygen used during your procedure.  There is no need for concern and it should clear up in a day or so.  SYMPTOMS TO REPORT IMMEDIATELY:  Following lower endoscopy (colonoscopy or flexible sigmoidoscopy):  Excessive amounts of blood in the stool  Significant tenderness or worsening of abdominal pains  Swelling of the abdomen that is new, acute  Fever of 100F or higher  Following upper endoscopy (EGD)  Vomiting of blood or coffee ground material  New chest pain or pain under the shoulder blades  Painful or persistently difficult swallowing  New shortness of breath  Fever of 100F or higher  Black, tarry-looking stools  For urgent or  emergent issues, a gastroenterologist can be reached at any hour by calling [phone]. Do not use MyChart messaging for urgent concerns.    DIET:  We do recommend a small meal at first, but then you may proceed to your regular diet.  Drink plenty of fluids but you should avoid alcoholic beverages for 24 hours.  ACTIVITY:  You should plan to take it easy for the rest of today and you should NOT DRIVE or use heavy machinery until tomorrow (because of the sedation medicines used during the test).    FOLLOW UP: Our staff will call the number listed on your records the next business day following your procedure.  We will call around 7:15- 8:00 am to check on you and address any questions or concerns that you may have regarding the information given to you following your procedure. If we do not reach you, we will leave a message.     If any biopsies were taken you will be contacted by phone or by letter within the next 1-3 weeks.  Please call us at [phone] if you have not heard about the biopsies in 3 weeks.    SIGNATURES/CONFIDENTIALITY: You and/or your care partner have signed paperwork which will be entered into your electronic medical record.  These signatures attest to the fact that that the information above on your After Visit Summary has been reviewed and is understood.  Full responsibility of the confidentiality of this discharge information lies with you and/or your care-partner.  Hiatal Hernia  A hiatal hernia occurs when part of the stomach slides above the muscle that separates the abdomen from the chest (diaphragm). A person can be born with a hiatal hernia (congenital), or it may develop over time. In almost all cases of hiatal hernia, only the top part of the stomach pushes through the diaphragm. Many people have a hiatal hernia with no symptoms. The larger the hernia, the more likely it is that you will have symptoms. In some cases, a hiatal hernia allows stomach acid to  flow back into the tube that carries food from your mouth to your stomach (esophagus). This may cause heartburn symptoms. The development of heartburn symptoms may mean that you have a condition called gastroesophageal reflux disease (GERD). What are the causes? This condition is caused by a weakness in the opening (hiatus) where the esophagus passes through the diaphragm to attach to the upper part of the stomach. A person may be born with a weakness in the hiatus, or a weakness can develop over time. What increases the risk? This condition is more likely to develop in: Older people. Age is a major risk factor for a hiatal hernia, especially if you are over the age of 66. Pregnant women. People who are overweight. People who have frequent constipation. What are the signs or symptoms? Symptoms of this condition usually develop in the form of GERD symptoms. Symptoms include: Heartburn. Upset stomach (indigestion). Trouble swallowing. Coughing or wheezing. Wheezing is making high-pitched whistling sounds when you breathe. Sore throat. Chest pain. Nausea and vomiting. How is this diagnosed? This condition may be diagnosed during testing for GERD. Tests that may be done include: X-rays of your stomach or chest. An upper gastrointestinal (GI) series. This is an X-ray exam of your GI tract that is taken after you swallow a chalky liquid that shows up clearly on the X-ray. Endoscopy. This is a procedure to look into your stomach using a thin, flexible tube that has a tiny camera and light on the end of it. How is this treated? This condition may be treated by: Dietary and lifestyle changes to help reduce GERD symptoms. Medicines. These may include: Over-the-counter antacids. Medicines that make your stomach empty more quickly. Medicines that block the production of stomach acid (H2 blockers). Stronger medicines to reduce stomach acid (proton pump inhibitors). Surgery to repair the hernia, if  other treatments are not helping. If you have no symptoms, you may not need treatment. Follow these instructions at home: Lifestyle and activity Do not use any products that contain nicotine or tobacco. These products include cigarettes, chewing tobacco, and vaping devices, such as e-cigarettes. If you need help quitting, ask your health care provider. Try to achieve and maintain a healthy body weight. Avoid putting pressure on your abdomen. Anything that puts pressure on your abdomen increases the amount of acid that may be pushed up into your esophagus. Avoid bending over, especially after eating. Raise the head of your bed by putting blocks under the legs. This keeps your head and esophagus higher than your stomach. Do not wear tight clothing around your chest or stomach. Try not to strain when having a bowel movement, when urinating, or when lifting heavy objects. Eating and drinking Avoid foods that can worsen GERD symptoms. These may include: Fatty foods, like fried foods. Citrus fruits, like oranges or lemon. Other foods and drinks that contain acid, like orange juice or tomatoes. Spicy food. Chocolate. Eat frequent small meals instead of three large meals  a day. This helps prevent your stomach from getting too full. Eat slowly. Do not lie down right after eating. Do not eat 1-2 hours before bed. Do not drink beverages with caffeine. These include cola, coffee, cocoa, and tea. Do not drink alcohol. General instructions Take over-the-counter and prescription medicines only as told by your health care provider. Keep all follow-up visits. Your health care provider will want to check that any new prescribed medicines are helping your symptoms. Contact a health care provider if: Your symptoms are not controlled with medicines or lifestyle changes. You are having trouble swallowing. You have coughing or wheezing that will not go away. Your pain is getting worse. Your pain spreads to  your arms, neck, jaw, teeth, or back. You feel nauseous or you vomit. Get help right away if: You have shortness of breath. You vomit blood. You have bright red blood in your stools. You have black, tarry stools. These symptoms may be an emergency. Get help right away. Call 911. Do not wait to see if the symptoms will go away. Do not drive yourself to the hospital. Summary A hiatal hernia occurs when part of the stomach slides above the muscle that separates the abdomen from the chest. A person may be born with a weakness in the hiatus, or a weakness can develop over time. Symptoms of a hiatal hernia may include heartburn, trouble swallowing, or sore throat. Management of a hiatal hernia includes eating frequent small meals instead of three large meals a day. Get help right away if you vomit blood, have bright red blood in your stools, or have black, tarry stools. This information is not intended to replace advice given to you by your health care provider. Make sure you discuss any questions you have with your health care provider. Document Revised: 09/01/2021 Document Reviewed: 09/01/2021 Elsevier Patient Education  2024 [employer].

## 2023-09-19 NOTE — Op Note (Signed)
 [facility] Patient Name: [name] Procedure Date: 09/19/2023 3:16 PM MRN: [id number] Endoscopist: Beverley Fiedler , MD, [id number] Age: 58 Referring MD:  Date of Birth: 1965/10/21 Gender: Female Account #: [id number] Procedure:                Upper GI endoscopy Indications:              Lynch Syndrome (MLH1), last EGD 1 year ago with                            antral ulcer without dysplasia or H. Pylori Medicines:                Monitored Anesthesia Care Procedure:                Pre-Anesthesia Assessment:                           - Prior to the procedure, a History and Physical                            was performed, and patient medications and                            allergies were reviewed. The patient's tolerance of                            previous anesthesia was also reviewed. The risks                            and benefits of the procedure and the sedation                            options and risks were discussed with the patient.                            All questions were answered, and informed consent                            was obtained. Prior Anticoagulants: The patient has                            taken no anticoagulant or antiplatelet agents. ASA                            Grade Assessment: II - A patient with mild systemic                            disease. After reviewing the risks and benefits,                            the patient was deemed in satisfactory condition to                            undergo the procedure.  After obtaining informed consent, the endoscope was                            passed under direct vision. Throughout the                            procedure, the patient's blood pressure, pulse, and                            oxygen saturations were monitored continuously. The                            Olympus scope [id number] was introduced through the                            mouth, and  advanced to the second part of duodenum.                            The upper GI endoscopy was accomplished without                            difficulty. The patient tolerated the procedure                            well. Scope In: Scope Out: Findings:                 The examined esophagus was normal.                           A 2 cm hiatal hernia was present.                           The entire examined stomach was normal.                           The examined duodenum was normal. Complications:            No immediate complications. Estimated Blood Loss:     Estimated blood loss: none. Impression:               - Normal esophagus.                           - 2 cm hiatal hernia.                           - Normal stomach.                           - Normal examined duodenum.                           - No specimens collected. Recommendation:           - Patient has a contact number available for  emergencies. The signs and symptoms of potential                            delayed complications were discussed with the                            patient. Return to normal activities tomorrow.                            Written discharge instructions were provided to the                            patient.                           - Resume previous diet.                           - Continue present medications.                           - Repeat upper endoscopy in 3 years for screening                            purposes. Beverley Fiedler, MD 09/19/2023 3:41:26 PM This report has been signed electronically.

## 2023-09-19 NOTE — Op Note (Signed)
 [facility] Patient Name: [name] Procedure Date: 09/19/2023 3:03 PM MRN: [id number] Endoscopist: Beverley Fiedler , MD, [id number] Age: 58 Referring MD:  Date of Birth: 1966-01-12 Gender: Female Account #: [id number] Procedure:                Colonoscopy Indications:              Last colonoscopy: January 2024, Lynch Syndrome                            (MLH-1 gene mutation), no hx of adenomatous or                            sessile serrated polyps (colonoscopies 2016, 2021,                            2022 (left side hyperplastic polyp x 1), 2024 Medicines:                Propofol per Anesthesia Procedure:                Pre-Anesthesia Assessment:                           - Prior to the procedure, a History and Physical                            was performed, and patient medications and                            allergies were reviewed. The patient's tolerance of                            previous anesthesia was also reviewed. The risks                            and benefits of the procedure and the sedation                            options and risks were discussed with the patient.                            All questions were answered, and informed consent                            was obtained. Prior Anticoagulants: The patient has                            taken no anticoagulant or antiplatelet agents. ASA                            Grade Assessment: II - A patient with mild systemic                            disease. After reviewing the risks and benefits,  the patient was deemed in satisfactory condition to                            undergo the procedure.                           After obtaining informed consent, the colonoscope                            was passed under direct vision. Throughout the                            procedure, the patient's blood pressure, pulse, and                            oxygen saturations were  monitored continuously. The                            Olympus Scope SN [id number] was introduced through the                            anus and advanced to the terminal ileum. The                            colonoscopy was performed without difficulty. The                            patient tolerated the procedure well. The quality                            of the bowel preparation was good. The terminal                            ileum, ileocecal valve, appendiceal orifice, and                            rectum were photographed. Scope In: 3:26:26 PM Scope Out: 3:38:11 PM Scope Withdrawal Time: 0 hours 7 minutes 0 seconds  Total Procedure Duration: 0 hours 11 minutes 45 seconds  Findings:                 The digital rectal exam was normal.                           The terminal ileum appeared normal.                           The entire examined colon appeared normal on direct                            and retroflexion views. Complications:            No immediate complications. Estimated Blood Loss:     Estimated blood loss: none. Impression:               - The examined portion of the  ileum was normal.                           - The entire examined colon is normal on direct and                            retroflexion views.                           - No specimens collected. Recommendation:           - Patient has a contact number available for                            emergencies. The signs and symptoms of potential                            delayed complications were discussed with the                            patient. Return to normal activities tomorrow.                            Written discharge instructions were provided to the                            patient.                           - Resume previous diet.                           - Continue present medications.                           - Repeat colonoscopy in 2 years for screening                             purposes. Beverley Fiedler, MD 09/19/2023 3:48:52 PM This report has been signed electronically.

## 2023-09-19 NOTE — Progress Notes (Unsigned)
 Pt's states no medical or surgical changes since previsit or office visit.

## 2023-09-19 NOTE — Progress Notes (Unsigned)
 To pacu, VSS. Report to [email or web address]

## 2023-09-20 ENCOUNTER — Telehealth: Payer: Self-pay

## 2023-09-20 NOTE — Telephone Encounter (Signed)
  Follow up Call-     09/19/2023    2:29 PM 08/03/2022    2:22 PM 06/18/2021    2:17 PM  Call back number  Post procedure Call Back phone  # [phone]  Permission to leave phone message No No No  comments no voicemail       Patient questions:  Do you have a fever, pain , or abdominal swelling? No. Pain Score  0 *  Have you tolerated food without any problems? Yes.    Have you been able to return to your normal activities? Yes.    Do you have any questions about your discharge instructions: Diet   No. Medications  No. Follow up visit  No.  Do you have questions or concerns about your Care? No.  Actions: * If pain score is 4 or above: No action needed, pain <4.

## 2023-11-22 DIAGNOSIS — M48061 Spinal stenosis, lumbar region without neurogenic claudication: Secondary | ICD-10-CM | POA: Insufficient documentation

## 2023-12-21 ENCOUNTER — Ambulatory Visit: Admitting: Internal Medicine

## 2023-12-21 VITALS — BP 122/80 | HR 81 | Temp 97.9°F | Ht 65.0 in | Wt 185.1 lb

## 2023-12-21 DIAGNOSIS — R739 Hyperglycemia, unspecified: Secondary | ICD-10-CM

## 2023-12-21 DIAGNOSIS — E559 Vitamin D deficiency, unspecified: Secondary | ICD-10-CM

## 2023-12-21 DIAGNOSIS — M79601 Pain in right arm: Secondary | ICD-10-CM | POA: Diagnosis not present

## 2023-12-21 DIAGNOSIS — E538 Deficiency of other specified B group vitamins: Secondary | ICD-10-CM

## 2023-12-21 DIAGNOSIS — F419 Anxiety disorder, unspecified: Secondary | ICD-10-CM | POA: Diagnosis not present

## 2023-12-21 DIAGNOSIS — E7800 Pure hypercholesterolemia, unspecified: Secondary | ICD-10-CM

## 2023-12-21 DIAGNOSIS — Z0001 Encounter for general adult medical examination with abnormal findings: Secondary | ICD-10-CM

## 2023-12-21 LAB — HEPATIC FUNCTION PANEL
ALT: 19 U/L (ref 0–35)
AST: 20 U/L (ref 0–37)
Albumin: 4.5 g/dL (ref 3.5–5.2)
Alkaline Phosphatase: 61 U/L (ref 39–117)
Bilirubin, Direct: 0.1 mg/dL (ref 0.0–0.3)
Total Bilirubin: 0.3 mg/dL (ref 0.2–1.2)
Total Protein: 7.5 g/dL (ref 6.0–8.3)

## 2023-12-21 LAB — LIPID PANEL
Cholesterol: 181 mg/dL (ref 0–200)
HDL: 74.9 mg/dL (ref >39.00)
LDL Cholesterol: 92 mg/dL (ref 0–99)
NonHDL: 105.66
Total CHOL/HDL Ratio: 2
Triglycerides: 70 mg/dL (ref 0.0–149.0)
VLDL: 14 mg/dL (ref 0.0–40.0)

## 2023-12-21 LAB — CBC WITH DIFFERENTIAL/PLATELET
Basophils Absolute: 0 10*3/uL (ref 0.0–0.1)
Basophils Relative: 1 % (ref 0.0–3.0)
Eosinophils Absolute: 0.2 10*3/uL (ref 0.0–0.7)
Eosinophils Relative: 4.3 % (ref 0.0–5.0)
HCT: 42.8 % (ref 36.0–46.0)
Hemoglobin: 14 g/dL (ref 12.0–15.0)
Lymphocytes Relative: 43.1 % (ref 12.0–46.0)
Lymphs Abs: 1.9 10*3/uL (ref 0.7–4.0)
MCHC: 32.7 g/dL (ref 30.0–36.0)
MCV: 84.1 fl (ref 78.0–100.0)
Monocytes Absolute: 0.4 10*3/uL (ref 0.1–1.0)
Monocytes Relative: 9.3 % (ref 3.0–12.0)
Neutro Abs: 1.9 10*3/uL (ref 1.4–7.7)
Neutrophils Relative %: 42.3 % — ABNORMAL LOW (ref 43.0–77.0)
Platelets: 259 10*3/uL (ref 150.0–400.0)
RBC: 5.09 Mil/uL (ref 3.87–5.11)
RDW: 13.9 % (ref 11.5–15.5)
WBC: 4.5 10*3/uL (ref 4.0–10.5)

## 2023-12-21 LAB — BASIC METABOLIC PANEL WITH GFR
BUN: 14 mg/dL (ref 6–23)
CO2: 30 meq/L (ref 19–32)
Calcium: 10.1 mg/dL (ref 8.4–10.5)
Chloride: 103 meq/L (ref 96–112)
Creatinine, Ser: 1.11 mg/dL (ref 0.40–1.20)
GFR: 55.7 mL/min — ABNORMAL LOW (ref >60.00)
Glucose, Bld: 94 mg/dL (ref 70–99)
Potassium: 3.9 meq/L (ref 3.5–5.1)
Sodium: 139 meq/L (ref 135–145)

## 2023-12-21 LAB — TSH: TSH: 0.95 u[IU]/mL (ref 0.35–5.50)

## 2023-12-21 LAB — HEMOGLOBIN A1C: Hgb A1c MFr Bld: 5.7 % (ref 4.6–6.5)

## 2023-12-21 LAB — VITAMIN D 25 HYDROXY (VIT D DEFICIENCY, FRACTURES): VITD: 28.19 ng/mL — ABNORMAL LOW (ref 30.00–100.00)

## 2023-12-21 LAB — VITAMIN B12: Vitamin B-12: 336 pg/mL (ref 211–911)

## 2023-12-21 MED ORDER — DOXYCYCLINE HYCLATE 100 MG PO TABS: 100.0000 mg | ORAL_TABLET | Freq: Two times a day (BID) | ORAL | 0 refills | Status: DC

## 2023-12-21 MED ORDER — PREDNISONE 10 MG PO TABS: ORAL_TABLET | ORAL | 0 refills | Status: DC

## 2023-12-21 NOTE — Assessment & Plan Note (Signed)
Last vitamin D Lab Results  Component Value Date   VD25OH 47.12 12/22/2022   Stable, cont oral replacement

## 2023-12-21 NOTE — Assessment & Plan Note (Signed)
 Exam c/w cellulitis vs inflammatory reaction likely spider or other related; for doxy 100  bid course, prednisone  taper

## 2023-12-21 NOTE — Assessment & Plan Note (Signed)
Age and sex appropriate education and counseling updated with regular exercise and diet Referrals for preventative services - none needed Immunizations addressed - for shignrix at pharmacy Smoking counseling  - none needed Evidence for depression or other mood disorder - none significant Most recent labs reviewed. I have personally reviewed and have noted: 1) the patient's medical and social history 2) The patient's current medications and supplements 3) The patient's height, weight, and BMI have been recorded in the chart

## 2023-12-21 NOTE — Progress Notes (Signed)
 Patient ID: [name], female   DOB: 04-15-1966, 58 y.o.   MRN: [id number]         Chief Complaint:: wellness exam and right arm pain, low vit d, hld, anxiety       HPI:  [name] is a 58 y.o. female here for wellness exam; had pap smear and mammogram done 2 wks ago, for shingrix at pharmacy, o/w up to date                        Also had some kind of bite during the night last evening to the mid right lateral upper arm now with about 3 cm area redness, pain, swelling.  No fever, chills.  Pt denies chest pain, increased sob or doe, wheezing, orthopnea, PND, increased LE swelling, palpitations, dizziness or syncope.   Pt denies polydipsia, polyuria, or new focal neuro s/s.    Pt denies recent wt loss, night sweats, loss of appetite, or other constitutional symptoms   Denies worsening depressive symptoms, suicidal ideation, or panic   Wt Readings from Last 3 Encounters:  12/21/23 185 lb 2 oz (84 kg)  09/19/23 180 lb (81.6 kg)  08/09/23 179 lb 9.6 oz (81.5 kg)   BP Readings from Last 3 Encounters:  12/21/23 122/80  09/19/23 110/86  08/28/23 (!) 126/94   Immunization History  Administered Date(s) Administered   Influenza Split 04/19/2011   Influenza Whole 04/22/2004   PFIZER(Purple Top)SARS-COV-2 Vaccination 10/25/2019, 11/26/2019   Td 08/15/2008   Tdap 02/02/2019   Unspecified SARS-COV-2 Vaccination 11/26/2019   Health Maintenance Due  Topic Date Due   Zoster Vaccines- Shingrix (1 of 2) Never done   Cervical Cancer Screening (HPV/Pap Cotest)  09/11/2023      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    DIZZINESS 08/15/2008   DYSPNEA 05/20/2008   FATIGUE 05/20/2008   History of 2019 novel coronavirus disease (COVID-19) 05/23/2019   History of hiatal hernia    HSV infection    Hyperplastic colon polyp    KNEE PAIN, LEFT 09/11/2007   Left sided sciatica 04/19/2011   Lumbar herniated disc    Lynch syndrome    MIGRAINE HEADACHE 04/09/2007   Ovarian cyst    Palpitations 05/20/2008   Pancreatic lesion    PONV (postoperative nausea and vomiting)    PREMATURE VENTRICULAR CONTRACTIONS 08/23/2008   RASH-NONVESICULAR 01/17/2008   RUQ PAIN 02/23/2010   SICKLE CELL TRAIT 04/09/2007   Tension headache 04/09/2007   VARICOSE VEINS LOWER EXTREMITIES W/OTH COMPS 09/11/2007   Wears glasses    Past Surgical History:  Procedure Laterality Date   CESAREAN SECTION  2004   COLONOSCOPY  2016   FINGER FRACTURE SURGERY Left 03/2019   LAPAROSCOPIC ASSISTED VAGINAL HYSTERECTOMY N/A 10/23/2012   Procedure: LAPAROSCOPIC ASSISTED VAGINAL HYSTERECTOMY;  Surgeon: Piedad Brewer, MD;  Location: [facility];  Service: Gynecology;  Laterality: N/A;   TONSILLECTOMY  07/18/2009   TUBAL LIGATION  01/06/2012   with cyst removal   UPPER GASTROINTESTINAL ENDOSCOPY      reports that she has never smoked. She has  never used smokeless tobacco. She reports current alcohol use. She reports that she does not use drugs. family history includes Brain cancer in her maternal aunt; Cancer in her cousin and mother; Heart attack in her father; Heart disease in her mother; Hypertension in her mother; Liver cancer in her maternal aunt; Lung cancer in her maternal aunt; Pancreatitis in her father. Allergies  Allergen Reactions   Morphine  Hives   Current Outpatient Medications on File Prior to Visit  Medication Sig Dispense Refill   fluocinonide cream (LIDEX) 0.05 % Apply 1 Application topically as needed.     gabapentin  (NEURONTIN ) 300 MG capsule Take 1 capsule (300 mg total) by mouth 3 (three) times daily. 90 capsule 0    oxyCODONE -acetaminophen  (PERCOCET/ROXICET) 5-325 MG tablet Take 1 tablet by mouth every 6 (six) hours as needed for severe pain (pain score 7-10). 10 tablet 0   No current facility-administered medications on file prior to visit.        ROS:  All others reviewed and negative.  Objective        PE:  BP 122/80   Pulse 81   Temp 97.9 F (36.6 C) (Temporal)   Ht 5\' 5"  (1.651 m)   Wt 185 lb 2 oz (84 kg)   LMP 10/05/2012   BMI 30.81 kg/m                 Constitutional: Pt appears in NAD               HENT: Head: NCAT.                Right Ear: External ear normal.                 Left Ear: External ear normal.                Eyes: . Pupils are equal, round, and reactive to light. Conjunctivae and EOM are normal               Nose: without d/c or deformity               Neck: Neck supple. Gross normal ROM               Cardiovascular: Normal rate and regular rhythm.                 Pulmonary/Chest: Effort normal and breath sounds without rales or wheezing.                Abd:  Soft, NT, ND, + BS, no organomegaly               Neurological: Pt is alert. At baseline orientation, motor grossly intact               Skin: Skin is warm. LE edema - none, right mid lateral upper arm with 3 cm area red, tender, swelling               Psychiatric: Pt behavior is normal without agitation   Micro: none  Cardiac tracings I have personally interpreted today:  none  Pertinent Radiological findings (summarize): none   Lab Results  Component Value Date   WBC 3.9 (L) 06/20/2023   HGB 14.3 06/20/2023   HCT 43.2 06/20/2023   PLT 275.0 06/20/2023   GLUCOSE 83 06/20/2023   CHOL 183 06/20/2023   TRIG 46.0 06/20/2023   HDL 70.90 06/20/2023   LDLCALC 103 (H) 06/20/2023   ALT  13 06/20/2023   AST 16 06/20/2023   NA 138 06/20/2023   K 4.1 06/20/2023   CL 102 06/20/2023   CREATININE 1.14 06/20/2023   BUN 15 06/20/2023   CO2 29 06/20/2023   TSH 1.30 06/20/2023   INR 1.0 03/01/2007   HGBA1C 5.8  06/20/2023   Assessment/Plan:  [name] is a 58 y.o. Black or African American [2] female with  has a past medical history of ACUTE NASOPHARYNGITIS (05/07/2010), Allergic rhinitis, cause unspecified (04/19/2011), Allergy, Anemia, BACK PAIN (02/05/2010), CELLULITIS (01/13/2010), CHEST PAIN UNSPECIFIED (08/23/2008), CONSTIPATION, CHRONIC (04/09/2007), DEEP VENOUS THROMBOPHLEBITIS, LEFT, LEG, HX OF (04/09/2007), DEGENERATIVE DISC DISEASE, LUMBAR SPINE (05/12/2007), DIZZINESS (08/15/2008), DYSPNEA (05/20/2008), FATIGUE (05/20/2008), History of 2019 novel coronavirus disease (COVID-19) (05/23/2019), History of hiatal hernia, HSV infection, Hyperplastic colon polyp, KNEE PAIN, LEFT (09/11/2007), Left sided sciatica (04/19/2011), Lumbar herniated disc, Lynch syndrome, MIGRAINE HEADACHE (04/09/2007), Ovarian cyst, Palpitations (05/20/2008), Pancreatic lesion, PONV (postoperative nausea and vomiting), PREMATURE VENTRICULAR CONTRACTIONS (08/23/2008), RASH-NONVESICULAR (01/17/2008), RUQ PAIN (02/23/2010), SICKLE CELL TRAIT (04/09/2007), Tension headache (04/09/2007), VARICOSE VEINS LOWER EXTREMITIES W/OTH COMPS (09/11/2007), and Wears glasses.  Encounter for well adult exam with abnormal findings Age and sex appropriate education and counseling updated with regular exercise and diet Referrals for preventative services - none needed Immunizations addressed - for shignrix at pharmacy Smoking counseling  - none needed Evidence for depression or other mood disorder - none significant Most recent labs reviewed. I have personally reviewed and have noted: 1) the patient's medical and social history 2) The patient's current medications and supplements 3) The patient's height, weight, and BMI have been recorded in the chart   Anxiety Mild chronic stable, cont same tx,   HLD (hyperlipidemia) Lab Results  Component Value Date   LDLCALC 103 (H) 06/20/2023   Uncontrolled, has been working on lower chol diet,  for f/u lab today   Vitamin D  deficiency Last vitamin D  Lab Results  Component Value Date   VD25OH 47.12 12/22/2022   Stable, cont oral replacement   Right arm pain Exam c/w cellulitis vs inflammatory reaction likely spider or other related; for doxy 100  bid course, prednisone  taper  Followup: Return in about 1 year (around 12/20/2024).  Rosalia Colonel, MD 12/21/2023 7:39 PM [facility]

## 2023-12-21 NOTE — Assessment & Plan Note (Signed)
 Mild chronic stable, cont same tx,

## 2023-12-21 NOTE — Assessment & Plan Note (Signed)
 Lab Results  Component Value Date   LDLCALC 103 (H) 06/20/2023   Uncontrolled, has been working on lower chol diet, for f/u lab today

## 2023-12-21 NOTE — Patient Instructions (Signed)
 Please take all new medication as prescribed - the antibiotic, and prednisone   Please continue all other medications as before, and refills have been done if requested.  Please have the pharmacy call with any other refills you may need.  Please continue your efforts at being more active, low cholesterol diet, and weight control.  You are otherwise up to date with prevention measures today.  Please keep your appointments with your specialists as you may have planned  Please go to the LAB at the blood drawing area for the tests to be done  You will be contacted by phone if any changes need to be made immediately.  Otherwise, you will receive a letter about your results with an explanation, but please check with MyChart first.  Please make an Appointment to return for your 1 year visit, or sooner if needed

## 2023-12-22 ENCOUNTER — Ambulatory Visit: Payer: Self-pay | Admitting: Internal Medicine

## 2023-12-22 LAB — URINALYSIS, ROUTINE W REFLEX MICROSCOPIC
Bilirubin Urine: NEGATIVE
Hgb urine dipstick: NEGATIVE
Ketones, ur: NEGATIVE
Leukocytes,Ua: NEGATIVE
Nitrite: NEGATIVE
Specific Gravity, Urine: 1.015 (ref 1.000–1.030)
Total Protein, Urine: NEGATIVE
Urine Glucose: NEGATIVE
Urobilinogen, UA: 0.2 (ref 0.0–1.0)
pH: 6.5 (ref 5.0–8.0)

## 2023-12-22 NOTE — Progress Notes (Signed)
 The test results show that your current treatment is OK, as the tests are stable.  Please continue the same plan.  There is no other need for change of treatment or further evaluation based on these results, at this time.  thanks

## 2024-03-30 ENCOUNTER — Ambulatory Visit: Payer: Self-pay

## 2024-04-10 ENCOUNTER — Encounter: Payer: Self-pay | Admitting: *Deleted

## 2024-04-11 ENCOUNTER — Ambulatory Visit: Attending: Cardiology | Admitting: Cardiology

## 2024-04-11 ENCOUNTER — Encounter: Payer: Self-pay | Admitting: Cardiology

## 2024-04-11 VITALS — BP 122/80 | HR 71 | Ht 65.0 in | Wt 187.0 lb

## 2024-04-11 DIAGNOSIS — R002 Palpitations: Secondary | ICD-10-CM

## 2024-04-11 DIAGNOSIS — R079 Chest pain, unspecified: Secondary | ICD-10-CM | POA: Diagnosis not present

## 2024-04-11 DIAGNOSIS — R072 Precordial pain: Secondary | ICD-10-CM | POA: Diagnosis not present

## 2024-04-11 DIAGNOSIS — N189 Chronic kidney disease, unspecified: Secondary | ICD-10-CM | POA: Diagnosis not present

## 2024-04-11 NOTE — Patient Instructions (Addendum)
 Medication Instructions:  Your physician recommends that you continue on your current medications as directed. Please refer to the Current Medication list given to you today.  *If you need a refill on your cardiac medications before your next appointment, please call your pharmacy*  Lab Work: CMET, Mag If you have labs (blood work) drawn today and your tests are completely normal, you will receive your results only by: MyChart Message (if you have MyChart) OR A paper copy in the mail If you have any lab test that is abnormal or we need to change your treatment, we will call you to review the results.  Follow-Up: At [facility], you and your health needs are our priority.  As part of our continuing mission to provide you with exceptional heart care, our providers are all part of one team.  This team includes your primary Cardiologist (physician) and Advanced Practice Providers or APPs (Physician Assistants and Nurse Practitioners) who all work together to provide you with the care you need, when you need it.  Your next appointment:   1 year(s)  Provider:   Kardie Tobb, DO     Other Instructions [email or web address] [email or web address]        FDA-cleared, clinical grade mobile EKG monitor: [name] is the most clinically-validated mobile EKG used by the world's leading cardiac care medical professionals With Basic service, know instantly if your heart rhythm is normal or if atrial fibrillation is detected, and email the last single EKG recording to yourself or your doctor Premium service, available for purchase through the Kardia app for $9.99 per month or $99 per year, includes unlimited history and storage of your EKG recordings, a monthly EKG summary report to share with your doctor, along with the ability to track your blood pressure, activity and weight Includes one K[employer]iaMobile phone clip FREE SHIPPING: Standard delivery 1-3 business days.  Orders placed by 11:00am PST will ship that afternoon. Otherwise, will ship next business day. All orders ship via [employer] from [place], [place]    [employer] - sending an EKG Download app and set up profile. Run EKG - by placing 1-2 fingers on the silver plates After EKG is complete - Download PDF  - Skip password (if you apply a password the provider will need it to view the EKG) Click share button (square with upward arrow) in bottom left corner To send: choose MyChart (first time log into MyChart)  Pop up window about sending ECG Click continue Choose type of message Choose provider Type subject and message Click send (EKG should be attached)  - To send additional EKGs in one message click the paperclip image and bottom of page to attach.

## 2024-04-11 NOTE — Progress Notes (Unsigned)
 Cardiology Office Note:    Date:  04/13/2024   ID:  [name], DOB 19-Jan-1966, MRN [id number]  PCP:  Norleen Lynwood ORN, MD  Cardiologist:  Dub Huntsman, DO  Electrophysiologist:  None   Referring MD: Norleen Lynwood ORN, MD    I am having chest pain   History of Present Illness:    [name] is a 58 y.o. female with a hx of tiny PFO which was recently seen on coronary CTA, at her visit in September 2024 she was experiencing chest discomfort.  Given her family history of premature CAD and send patient for coronary CTA.  This was normal with no evidence of coronary artery disease.  She  tells me that she has been experiencing  intermittent palpitations, more noticeable when lying on her left side, sitting, or walking. Caffeine, particularly coffee, exacerbates the sensation, causing a 'funny feeling' in her chest without associated tightness or dyspnea. Previous cardiac monitoring in July 2024 showed no abnormalities.    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   DIZZINESS 08/15/2008   DYSPNEA 05/20/2008   FATIGUE 05/20/2008   History of 2019 novel coronavirus disease (COVID-19) 05/23/2019   History of hiatal hernia    HSV infection    Hyperplastic colon polyp    KNEE PAIN, LEFT 09/11/2007   Left sided sciatica 04/19/2011   Lumbar herniated disc    Lynch syndrome    MIGRAINE HEADACHE 04/09/2007   Ovarian cyst    Palpitations 05/20/2008   Pancreatic lesion    PONV (postoperative nausea and vomiting)    PREMATURE VENTRICULAR CONTRACTIONS 08/23/2008   RASH-NONVESICULAR 01/17/2008   RUQ PAIN 02/23/2010   SICKLE  CELL TRAIT 04/09/2007   Tension headache 04/09/2007   VARICOSE VEINS LOWER EXTREMITIES W/OTH COMPS 09/11/2007   Wears glasses     Past Surgical History:  Procedure Laterality Date   CESAREAN SECTION  2004   COLONOSCOPY  2016   FINGER FRACTURE SURGERY Left 03/2019   LAPAROSCOPIC ASSISTED VAGINAL HYSTERECTOMY N/A 10/23/2012   Procedure: LAPAROSCOPIC ASSISTED VAGINAL HYSTERECTOMY;  Surgeon: Charlie CHRISTELLA Croak, MD;  Location: [facility];  Service: Gynecology;  Laterality: N/A;   TONSILLECTOMY  07/18/2009   TUBAL LIGATION  01/06/2012   with cyst removal   UPPER GASTROINTESTINAL ENDOSCOPY      Current Medications: Current Meds  Medication Sig   fluocinonide cream (LIDEX) 0.05 % Apply 1 Application topically as needed.   gabapentin  (NEURONTIN ) 300 MG capsule Take 1 capsule (300 mg total) by mouth 3 (three) times daily.   oxyCODONE -acetaminophen  (PERCOCET/ROXICET) 5-325 MG tablet Take 1 tablet by mouth every 6 (six) hours as needed for severe pain (pain score 7-10).     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     Employer: [employer]   Occupation: currently does not  work outside the home (12/06/19)  Tobacco Use   Smoking status: Never   Smokeless tobacco: Never  Vaping Use   Vaping status: Never Used  Substance and Sexual Activity   Alcohol use: Yes    Comment: socially rare every 1-2 months   Drug use: No   Sexual activity: Not on file  Other Topics Concern   Not on file  Social History Narrative   Not on file   Social Drivers of Health   Financial Resource Strain: Low Risk  (01/14/2024)   Received from [facility]   Overall Financial Resource Strain (CARDIA)    Difficulty of Paying Living Expenses: Not hard at  all  Food Insecurity: No Food Insecurity (01/14/2024)   [facility]   Hunger Vital Sign    Within the past 12 months, you worried that your food would run out before you got the money to buy more.: Never true    Within the past 12 months, the food you bought just didn't last and you didn't have money to get more.: Never true  Transportation Needs: No Transportation Needs (01/14/2024)   Received from [facility] - Transportation    Lack of Transportation (Medical): No    Lack of Transportation (Non-Medical): No  Physical Activity: Sufficiently Active (01/14/2024)   Received from [facility]   Exercise Vital Sign    On average, how many days per week do you engage in moderate to strenuous exercise (like a brisk walk)?: 3 days    On average, how many minutes do you engage in exercise at this level?: 60 min  Stress: No Stress Concern Present (01/14/2024)   Received from [facility] - Occupational Stress Questionnaire    Feeling of Stress : Not at all  Social Connections: Socially Integrated (01/14/2024)   Received from [facility]   Social Network    How would you rate your social network (family, work, friends)?: Good participation with social networks     Family History: The patient's family history includes Brain cancer in her maternal aunt; Cancer in her cousin and mother; Heart attack in her father; Heart disease in her mother; Hypertension in her mother; Liver cancer in her maternal aunt; Lung cancer in her maternal aunt; Pancreatitis in her father. There is no history of Colon cancer, Colon polyps, Esophageal cancer, Rectal cancer, or Stomach cancer.  ROS:   Review of Systems  Constitution: Negative for decreased appetite, fever and weight gain.  HENT: Negative for congestion, ear discharge, hoarse voice and sore throat.   Eyes: Negative for discharge, redness, vision loss in right eye and visual halos.   Cardiovascular: Negative for chest pain, dyspnea on exertion, leg swelling, orthopnea and palpitations.  Respiratory: Negative for cough, hemoptysis, shortness of breath and snoring.   Endocrine: Negative for heat intolerance and polyphagia.  Hematologic/Lymphatic: Negative for bleeding problem. Does not bruise/bleed easily.  Skin: Negative for flushing, nail changes, rash and suspicious lesions.  Musculoskeletal: Negative for arthritis, joint pain, muscle cramps, myalgias, neck pain and stiffness.  Gastrointestinal: Negative for abdominal pain, bowel incontinence, diarrhea and excessive appetite.  Genitourinary: Negative for decreased libido, genital sores and incomplete emptying.  Neurological: Negative for brief paralysis, focal weakness, headaches and loss of balance.  Psychiatric/Behavioral: Negative for altered mental status, depression and suicidal ideas.  Allergic/Immunologic: Negative for HIV exposure and persistent infections.    EKGs/Labs/Other Studies Reviewed:    The following studies were reviewed  today:   EKG:  The ekg ordered today demonstrates sinus rhythm, heart rate 75 bpm.  Recent Labs: 12/21/2023: Hemoglobin 14.0; Platelets 259.0; TSH 0.95 04/12/2024: ALT 19; BUN 16; Creatinine, Ser 1.13; Magnesium 2.4; Potassium 3.8; Sodium 142  Recent Lipid Panel    Component Value Date/Time   CHOL 181 12/21/2023 1600   TRIG 70.0 12/21/2023 1600   HDL 74.90 12/21/2023 1600   CHOLHDL 2 12/21/2023 1600   VLDL 14.0 12/21/2023 1600   LDLCALC 92 12/21/2023 1600    Physical Exam:    VS:  BP 122/80 (BP Location: Right Arm, Patient Position: Sitting, Cuff Size: Normal)   Pulse 71   Ht 5' 5 (1.651 m)   Wt 187 lb (84.8 kg)   LMP 10/05/2012   SpO2 98%   BMI 31.12 kg/m     Wt Readings from Last 3 Encounters:  04/11/24 187 lb (84.8 kg)  12/21/23 185 lb 2 oz (84 kg)  09/19/23 180 lb (81.6 kg)     GEN: Well nourished, well developed in no acute distress HEENT: Normal NECK:  No JVD; No carotid bruits LYMPHATICS: No lymphadenopathy CARDIAC: S1S2 noted,RRR, no murmurs, rubs, gallops RESPIRATORY:  Clear to auscultation without rales, wheezing or rhonchi  ABDOMEN: Soft, non-tender, non-distended, +bowel sounds, no guarding. EXTREMITIES: No edema, No cyanosis, no clubbing MUSCULOSKELETAL:  No deformity  SKIN: Warm and dry NEUROLOGIC:  Alert and oriented x 3, non-focal PSYCHIATRIC:  Normal affect, good insight  ASSESSMENT:    1. Chest pain, unspecified type   2. Precordial pain   3. Chronic kidney disease, unspecified CKD stage   4. Palpitations     PLAN:    Palpitations Intermittent palpitations worsened by coffee and positional changes. Previous normal monitoring, but increased frequency suggests possible arrhythmias. - Discussed mobile cardiac monitor [facility]) for self-monitoring and EKG recording. - Instructed to send EKG recordings via MyChart. - Consider two-week cardiac monitor if mobile device not purchased. Deferred use of zio monitor for now.   Chronic kidney disease -  GFR consistent with Stage 3 CKD - Refer to nephrologist for evaluation and management. - Avoid nephrotoxic medications like ibuprofen.  Gastroesophageal reflux disease without esophagitis Severe heartburn with no endoscopic abnormalities. OTC medications used; pantoprazole  discontinued due to osteoporosis risk. - Consider prescription medication if symptoms persist. - Advised against aspirin due to bleeding risk and reflux exacerbation.  Vitamin D  deficiency Previously low vitamin D , currently on 2000 IU daily. Last checked in June. - Continue 2000 IU daily supplementation. - Repeat vitamin D  level in future to assess efficacy.   - Order blood work for [employer] and magnesium levels. -  The patient is in agreement with the above plan. The patient left the office in stable condition.  The patient will follow up in   Medication Adjustments/Labs and Tests Ordered: Current  medicines are reviewed at length with the patient today.  Concerns regarding medicines are outlined above.  Orders Placed This Encounter  Procedures   Comp Met (CMET)   Magnesium   Ambulatory referral to Nephrology   EKG 12-Lead   No orders of the defined types were placed in this encounter.   Patient Instructions  Medication Instructions:  Your physician recommends that you continue on your current medications as directed. Please refer to the Current Medication list given to you today.  *If you need a refill on your cardiac medications before your next appointment, please call your pharmacy*  Lab Work: CMET, Mag If you have labs (blood work)  drawn today and your tests are completely normal, you will receive your results only by: MyChart Message (if you have MyChart) OR A paper copy in the mail If you have any lab test that is abnormal or we need to change your treatment, we will call you to review the results.  Follow-Up: At [facility], you and your health needs are our priority.  As part of our continuing mission to provide you with exceptional heart care, our providers are all part of one team.  This team includes your primary Cardiologist (physician) and Advanced Practice Providers or APPs (Physician Assistants and Nurse Practitioners) who all work together to provide you with the care you need, when you need it.  Your next appointment:   1 year(s)  Provider:   Baljit Liebert, DO     Other Instructions KardiaMobile [email or web address]        FDA-cleared, clinical grade mobile EKG monitor: Crist is the most clinically-validated mobile EKG used by the world's leading cardiac care medical professionals With Basic service, know instantly if your heart rhythm is normal or if atrial fibrillation is detected, and email the last single EKG recording to yourself or your doctor Premium service, available for purchase through the  Kardia app for $9.99 per month or $99 per year, includes unlimited history and storage of your EKG recordings, a monthly EKG summary report to share with your doctor, along with the ability to track your blood pressure, activity and weight Includes one KardiaMobile phone clip FREE SHIPPING: Standard delivery 1-3 business days. Orders placed by 11:00am PST will ship that afternoon. Otherwise, will ship next business day. All orders ship via [employer] from [place], [place]    [employer] - sending an EKG Download app and set up profile. Run EKG - by placing 1-2 fingers on the silver plates After EKG is complete - Download PDF  - Skip password (if you apply a password the provider will need it to view the EKG) Click share button (square with upward arrow) in bottom left corner To send: choose MyChart (first time log into MyChart)  Pop up window about sending ECG Click continue Choose type of message Choose provider Type subject and message Click send (EKG should be attached)  - To send additional EKGs in one message click the paperclip image and bottom of page to attach.              Adopting a Healthy Lifestyle.  Know what a healthy weight is for you (roughly BMI <25) and aim to maintain this   Aim for 7+ servings of fruits and vegetables daily   65-80+ fluid ounces of water  or unsweet tea for healthy kidneys   Limit to max 1 drink of alcohol per day; avoid smoking/tobacco   Limit animal fats in diet for cholesterol and heart health - choose grass fed whenever available   Avoid highly processed foods, and foods high in saturated/trans fats   Aim for low stress - take time to unwind and care for your mental health   Aim for 150 min of moderate intensity exercise weekly for heart health, and weights twice weekly for bone health   Aim for 7-9 hours of sleep daily   When it comes to diets, agreement about the perfect plan isnt easy to find, even among the experts. Experts  at the [facility] [employer] developed an idea known as the Healthy Eating Plate. Just imagine a plate divided into logical,  healthy portions.   The emphasis is on diet quality:   Load up on vegetables and fruits - one-half of your plate: Aim for color and variety, and remember that potatoes dont count.   Go for whole grains - one-quarter of your plate: Whole wheat, barley, wheat berries, quinoa, oats, brown rice, and foods made with them. If you want pasta, go with whole wheat pasta.   Protein power - one-quarter of your plate: Fish, chicken, beans, and nuts are all healthy, versatile protein sources. Limit red meat.   The diet, however, does go beyond the plate, offering a few other suggestions.   Use healthy plant oils, such as olive, canola, soy, corn, sunflower and peanut. Check the labels, and avoid partially hydrogenated oil, which have unhealthy trans fats.   If youre thirsty, drink water . Coffee and tea are good in moderation, but skip sugary drinks and limit milk and dairy products to one or two daily servings.   The type of carbohydrate in the diet is more important than the amount. Some sources of carbohydrates, such as vegetables, fruits, whole grains, and beans-are healthier than others.   Finally, stay active  Bonney Dub Huntsman, DO  04/13/2024 10:37 PM     [facility]

## 2024-04-13 ENCOUNTER — Ambulatory Visit: Payer: Self-pay | Admitting: Cardiology

## 2024-04-13 LAB — COMPREHENSIVE METABOLIC PANEL WITH GFR
ALT: 19 IU/L (ref 0–32)
AST: 19 IU/L (ref 0–40)
Albumin: 5.1 g/dL — ABNORMAL HIGH (ref 3.8–4.9)
Alkaline Phosphatase: 95 IU/L (ref 49–135)
BUN/Creatinine Ratio: 14 (ref 9–23)
BUN: 16 mg/dL (ref 6–24)
Bilirubin Total: 0.4 mg/dL (ref 0.0–1.2)
CO2: 19 mmol/L — ABNORMAL LOW (ref 20–29)
Calcium: 10.5 mg/dL — ABNORMAL HIGH (ref 8.7–10.2)
Chloride: 98 mmol/L (ref 96–106)
Creatinine, Ser: 1.13 mg/dL — ABNORMAL HIGH (ref 0.57–1.00)
Globulin, Total: 3.4 g/dL (ref 1.5–4.5)
Glucose: 66 mg/dL — ABNORMAL LOW (ref 70–99)
Potassium: 3.8 mmol/L (ref 3.5–5.2)
Sodium: 142 mmol/L (ref 134–144)
Total Protein: 8.5 g/dL (ref 6.0–8.5)
eGFR: 57 mL/min/1.73 — ABNORMAL LOW (ref >59)

## 2024-04-13 LAB — MAGNESIUM: Magnesium: 2.4 mg/dL — ABNORMAL HIGH (ref 1.6–2.3)

## 2024-05-11 ENCOUNTER — Ambulatory Visit: Admitting: Internal Medicine

## 2024-05-16 ENCOUNTER — Ambulatory Visit: Admitting: Physician Assistant

## 2024-05-23 ENCOUNTER — Ambulatory Visit: Admitting: Internal Medicine

## 2024-05-29 ENCOUNTER — Ambulatory Visit: Admitting: Internal Medicine

## 2024-06-08 ENCOUNTER — Ambulatory Visit: Admitting: Internal Medicine

## 2024-06-13 ENCOUNTER — Ambulatory Visit
Admission: EM | Admit: 2024-06-13 | Discharge: 2024-06-13 | Disposition: A | Discharge status: Home / Self Care | Attending: Internal Medicine | Admitting: Internal Medicine

## 2024-06-13 ENCOUNTER — Encounter: Payer: Self-pay | Admitting: Internal Medicine

## 2024-06-13 DIAGNOSIS — S61211A Laceration without foreign body of left index finger without damage to nail, initial encounter: Secondary | ICD-10-CM

## 2024-06-13 DIAGNOSIS — S61213A Laceration without foreign body of left middle finger without damage to nail, initial encounter: Secondary | ICD-10-CM | POA: Diagnosis not present

## 2024-06-13 MED ORDER — DOXYCYCLINE HYCLATE 100 MG PO CAPS: 100.0000 mg | ORAL_CAPSULE | Freq: Two times a day (BID) | ORAL | 0 refills | Status: AC

## 2024-06-13 NOTE — Discharge Instructions (Addendum)
 Laceration repair done to the left index finger with 8 sutures.  These will need to be removed in 7 to 10 days.  Leave the current dressing in place for approximately 24 hours then may remove the dressing and wash the area with soap and water .  Recommend replacing the dressing and changing the dressing twice a day for the first 2 to 3 days then may leave the area open to air if you are not doing any activity that could contaminate the wound.  Do not submerge the wound completely underwater.  We will start doxycycline  100 mg twice daily for 3 days.  Tetanus is up-to-date.  If you develop increased pain, purulent drainage, significant redness then return to urgent care for further evaluation.  If the flap present turns blue or black then return to urgent care.

## 2024-06-13 NOTE — ED Triage Notes (Addendum)
 Pt presents with laceration to left middle and pointer finger. She accidentally cut herself while cutting yams today.  Last Tdap 02/02/2019

## 2024-06-13 NOTE — ED Provider Notes (Signed)
 [facility]    CSN: [id number] Arrival date & time: 06/13/24  1609      History   Chief Complaint Chief Complaint  Patient presents with   Laceration    HPI [name] is a 57 y.o. female.   58 year old female who presents urgent care secondary to a laceration to the left index finger.  She reports this happened prior to arrival when she was using a knife to cut.  She has held pressure on the area and the bleeding has stopped.  Her last tetanus was in July 2020.     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   DIZZINESS 08/15/2008   DYSPNEA 05/20/2008   FATIGUE 05/20/2008   History of 2019 novel coronavirus disease (COVID-19) 05/23/2019   History of hiatal hernia    HSV infection    Hyperplastic colon polyp    KNEE PAIN, LEFT 09/11/2007   Left sided sciatica 04/19/2011   Lumbar herniated disc    Lynch syndrome    MIGRAINE HEADACHE 04/09/2007   Ovarian cyst    Palpitations 05/20/2008   Pancreatic lesion    PONV (postoperative nausea and vomiting)    PREMATURE VENTRICULAR CONTRACTIONS 08/23/2008   RASH-NONVESICULAR 01/17/2008   RUQ PAIN 02/23/2010   SICKLE CELL TRAIT 04/09/2007   Tension headache 04/09/2007   VARICOSE VEINS LOWER EXTREMITIES W/OTH COMPS 09/11/2007   Wears glasses     Patient Active Problem List   Diagnosis Date Noted   Right arm pain 12/21/2023   HLD (hyperlipidemia) 06/21/2023   Right knee pain 06/21/2023   Encounter for well adult exam with abnormal findings 06/20/2023   PFO (patent foramen ovale) 06/20/2023   Gastroesophageal reflux disease without  esophagitis 04/26/2023   Anxiety 01/13/2023   Syncope 01/10/2023   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   Neoplasm of uncertain behavior of skin 12/03/2021   PHN (postherpetic neuralgia) 07/05/2021   Eustachian tube dysfunction, bilateral 05/10/2021   Vitamin D  deficiency 05/10/2021   Lynch syndrome 05/01/2021   Insomnia 12/02/2020   Venous (peripheral) insufficiency 12/18/2017   Adenomyosis 10/24/2012   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   PREMATURE VENTRICULAR CONTRACTIONS 08/23/2008   Non-cardiac chest pain 08/23/2008   Lightheadedness 08/15/2008   FATIGUE 05/20/2008   TONSILLITIS, CHRONIC 03/13/2008   DEGENERATIVE DISC DISEASE, 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   SICKLE CELL TRAIT 04/09/2007   Migraine headache 04/09/2007    Past Surgical History:  Procedure Laterality Date   CESAREAN SECTION  2004   COLONOSCOPY  2016   FINGER FRACTURE SURGERY Left 03/2019   LAPAROSCOPIC ASSISTED VAGINAL HYSTERECTOMY N/A 10/23/2012   Procedure: LAPAROSCOPIC ASSISTED VAGINAL HYSTERECTOMY;  Surgeon: Charlie CHRISTELLA Croak, MD;  Location: [facility];  Service: Gynecology;  Laterality: N/A;   TONSILLECTOMY  07/18/2009   TUBAL LIGATION  01/06/2012   with cyst removal   UPPER GASTROINTESTINAL ENDOSCOPY      OB History     Gravida  3   Para  3   Term      Preterm      AB      Living  3  SAB      IAB      Ectopic      Multiple      Live Births               Home Medications    Prior to Admission medications   Medication Sig Start Date End Date Taking? Authorizing Provider  doxycycline  (VIBRAMYCIN ) 100 MG capsule Take 1 capsule (100 mg total) by mouth 2 (two) times daily for 3 days. 06/13/24 06/16/24 Yes Brette Cast A, PA-C  fluocinonide cream (LIDEX) 0.05 % Apply 1 Application topically as needed. 11/18/22   [provider]  gabapentin  (NEURONTIN ) 300 MG capsule Take 1 capsule (300 mg total) by mouth 3 (three) times daily. 07/24/23   Randol Simmonds, MD   oxyCODONE -acetaminophen  (PERCOCET/ROXICET) 5-325 MG tablet Take 1 tablet by mouth every 6 (six) hours as needed for severe pain (pain score 7-10). 07/24/23   Randol Simmonds, MD  predniSONE  (DELTASONE ) 10 MG tablet 3 tabs by mouth per day for 3 days,2tabs per day for 3 days,1tab per day for 3 days Patient not taking: Reported on 04/11/2024 12/21/23   Norleen Lynwood ORN, MD    Family History Family History  Problem Relation Age of Onset   Heart disease Mother    Hypertension Mother    Cancer Mother        ovarian    Heart attack Father    Pancreatitis Father    Liver cancer Maternal Aunt    Brain cancer Maternal Aunt    Lung cancer Maternal Aunt    Cancer Cousin        breast   Colon cancer Neg Hx    Colon polyps Neg Hx    Esophageal cancer Neg Hx    Rectal cancer Neg Hx    Stomach cancer Neg Hx     Social History Social History   Tobacco Use   Smoking status: Never   Smokeless tobacco: Never  Vaping Use   Vaping status: Never Used  Substance Use Topics   Alcohol use: Yes    Comment: socially rare every 1-2 months   Drug use: No     Allergies   Morphine    Review of Systems Review of Systems  Constitutional:  Negative for chills and fever.  HENT:  Negative for ear pain and sore throat.   Eyes:  Negative for pain and visual disturbance.  Respiratory:  Negative for cough and shortness of breath.   Cardiovascular:  Negative for chest pain and palpitations.  Gastrointestinal:  Negative for abdominal pain and vomiting.  Genitourinary:  Negative for dysuria and hematuria.  Musculoskeletal:  Negative for arthralgias and back pain.  Skin:  Positive for wound. Negative for color change and rash.  Neurological:  Negative for seizures and syncope.  All other systems reviewed and are negative.    Physical Exam Triage Vital Signs ED Triage Vitals [06/13/24 1623]  Encounter Vitals Group     BP 133/85     Girls Systolic BP Percentile      Girls Diastolic BP Percentile      Boys  Systolic BP Percentile      Boys Diastolic BP Percentile      Pulse Rate 82     Resp 18     Temp 98.7 F (37.1 C)     Temp Source Oral     SpO2 97 %     Weight      Height      Head Circumference  Peak Flow      Pain Score      Pain Loc      Pain Education      Exclude from Growth Chart    No data found.  Updated Vital Signs BP 133/85 (BP Location: Right Arm)   Pulse 82   Temp 98.7 F (37.1 C) (Oral)   Resp 18   LMP 10/05/2012   SpO2 97%   Visual Acuity Right Eye Distance:   Left Eye Distance:   Bilateral Distance:    Right Eye Near:   Left Eye Near:    Bilateral Near:     Physical Exam Vitals and nursing note reviewed.  Constitutional:      General: She is not in acute distress.    Appearance: She is well-developed.  HENT:     Head: Normocephalic and atraumatic.  Eyes:     Conjunctiva/sclera: Conjunctivae normal.  Cardiovascular:     Rate and Rhythm: Normal rate and regular rhythm.     Heart sounds: No murmur heard. Pulmonary:     Effort: Pulmonary effort is normal. No respiratory distress.     Breath sounds: Normal breath sounds.  Abdominal:     Palpations: Abdomen is soft.     Tenderness: There is no abdominal tenderness.  Musculoskeletal:        General: No swelling.       Hands:     Cervical back: Neck supple.  Skin:    General: Skin is warm and dry.     Capillary Refill: Capillary refill takes less than 2 seconds.  Neurological:     Mental Status: She is alert.  Psychiatric:        Mood and Affect: Mood normal.      UC Treatments / Results  Labs (all labs ordered are listed, but only abnormal results are displayed) Labs Reviewed - No data to display  EKG   Radiology No results found.  Procedures Laceration Repair  Date/Time: 06/13/2024 5:06 PM  Performed by: Teresa Almarie LABOR, PA-C Authorized by: Teresa Almarie LABOR, PA-C   Consent:    Consent obtained:  Verbal   Consent given by:  Patient   Risks discussed:   Infection, need for additional repair, pain, poor cosmetic result and poor wound healing   Alternatives discussed:  No treatment and delayed treatment Universal protocol:    Procedure explained and questions answered to patient or proxy's satisfaction: yes     Relevant documents present and verified: yes     Site/side marked: yes     Immediately prior to procedure, a time out was called: yes     Patient identity confirmed:  Verbally with patient Anesthesia:    Anesthesia method:  Local infiltration   Local anesthetic:  Lidocaine  1% w/o epi Laceration details:    Location:  Finger   Finger location:  L index finger   Length (cm):  2   Depth (mm):  5 Pre-procedure details:    Preparation:  Patient was prepped and draped in usual sterile fashion Exploration:    Hemostasis achieved with:  Direct pressure   Wound exploration: wound explored through full range of motion     Wound extent: no muscle damage noted, no nerve damage noted and no tendon damage noted   Treatment:    Area cleansed with:  Povidone-iodine   Amount of cleaning:  Standard   Irrigation solution:  Sterile saline   Irrigation method:  Syringe   Debridement:  None Skin repair:  Repair method:  Sutures   Suture size:  5-0   Suture material:  Prolene   Suture technique:  Simple interrupted   Number of sutures:  8 Approximation:    Approximation:  Close Repair type:    Repair type:  Simple Post-procedure details:    Dressing:  Bulky dressing   Procedure completion:  Tolerated well, no immediate complications  (including critical care time)  Medications Ordered in UC Medications - No data to display  Initial Impression / Assessment and Plan / UC Course  I have reviewed the triage vital signs and the nursing notes.  Pertinent labs & imaging results that were available during my care of the patient were reviewed by me and considered in my medical decision making (see chart for details).     Laceration of  left index finger without foreign body without damage to nail, initial encounter  Laceration of left middle finger without foreign body without damage to nail, initial encounter   Laceration repair done to the left index finger with 8 sutures.  These will need to be removed in 7 to 10 days.  Leave the current dressing in place for approximately 24 hours then may remove the dressing and wash the area with soap and water .  Recommend replacing the dressing and changing the dressing twice a day for the first 2 to 3 days then may leave the area open to air if you are not doing any activity that could contaminate the wound.  Do not submerge the wound completely underwater.  We will start doxycycline  100 mg twice daily for 3 days.  Tetanus is up-to-date.  If you develop increased pain, purulent drainage, significant redness then return to urgent care for further evaluation.  If the flap present turns blue or black then return to urgent care.  Final Clinical Impressions(s) / UC Diagnoses   Final diagnoses:  Laceration of left index finger without foreign body without damage to nail, initial encounter  Laceration of left middle finger without foreign body without damage to nail, initial encounter     Discharge Instructions      Laceration repair done to the left index finger with 8 sutures.  These will need to be removed in 7 to 10 days.  Leave the current dressing in place for approximately 24 hours then may remove the dressing and wash the area with soap and water .  Recommend replacing the dressing and changing the dressing twice a day for the first 2 to 3 days then may leave the area open to air if you are not doing any activity that could contaminate the wound.  Do not submerge the wound completely underwater.  We will start doxycycline  100 mg twice daily for 3 days.  Tetanus is up-to-date.  If you develop increased pain, purulent drainage, significant redness then return to urgent care for further  evaluation.  If the flap present turns blue or black then return to urgent care.     ED Prescriptions     Medication Sig Dispense Auth. Provider   doxycycline  (VIBRAMYCIN ) 100 MG capsule Take 1 capsule (100 mg total) by mouth 2 (two) times daily for 3 days. 6 capsule Teresa Almarie LABOR, PA-C      PDMP not reviewed this encounter.   Teresa Almarie LABOR, PA-C 06/13/24 1708

## 2024-06-18 ENCOUNTER — Telehealth: Payer: Self-pay

## 2024-06-18 NOTE — Telephone Encounter (Signed)
 Ok sure this is usually possible

## 2024-06-18 NOTE — Telephone Encounter (Signed)
 Copied from CRM #[id number]. Topic: Clinical - Medical Advice >> Jun 18, 2024  1:30 PM Dedra B wrote: Reason for CRM: Pt has appt with Dr. Norleen 12/4. She has stitches on her L index finger and wants to know if he can remove them during that appt.

## 2024-06-21 ENCOUNTER — Ambulatory Visit: Admitting: Internal Medicine

## 2024-06-21 ENCOUNTER — Ambulatory Visit: Payer: Self-pay | Admitting: Internal Medicine

## 2024-06-21 ENCOUNTER — Encounter: Payer: Self-pay | Admitting: Internal Medicine

## 2024-06-21 VITALS — BP 122/78 | HR 73 | Temp 97.8°F | Ht 65.0 in | Wt 189.0 lb

## 2024-06-21 DIAGNOSIS — K259 Gastric ulcer, unspecified as acute or chronic, without hemorrhage or perforation: Secondary | ICD-10-CM | POA: Insufficient documentation

## 2024-06-21 DIAGNOSIS — G43909 Migraine, unspecified, not intractable, without status migrainosus: Secondary | ICD-10-CM

## 2024-06-21 DIAGNOSIS — E7800 Pure hypercholesterolemia, unspecified: Secondary | ICD-10-CM

## 2024-06-21 DIAGNOSIS — D573 Sickle-cell trait: Secondary | ICD-10-CM

## 2024-06-21 DIAGNOSIS — D649 Anemia, unspecified: Secondary | ICD-10-CM

## 2024-06-21 DIAGNOSIS — F419 Anxiety disorder, unspecified: Secondary | ICD-10-CM

## 2024-06-21 DIAGNOSIS — E559 Vitamin D deficiency, unspecified: Secondary | ICD-10-CM

## 2024-06-21 DIAGNOSIS — K449 Diaphragmatic hernia without obstruction or gangrene: Secondary | ICD-10-CM | POA: Insufficient documentation

## 2024-06-21 DIAGNOSIS — R739 Hyperglycemia, unspecified: Secondary | ICD-10-CM

## 2024-06-21 DIAGNOSIS — Z23 Encounter for immunization: Secondary | ICD-10-CM

## 2024-06-21 LAB — CBC WITH DIFFERENTIAL/PLATELET
Basophils Absolute: 0 K/uL (ref 0.0–0.1)
Basophils Relative: 1.1 % (ref 0.0–3.0)
Eosinophils Absolute: 0.1 K/uL (ref 0.0–0.7)
Eosinophils Relative: 3.2 % (ref 0.0–5.0)
HCT: 45.7 % (ref 36.0–46.0)
Hemoglobin: 15 g/dL (ref 12.0–15.0)
Lymphocytes Relative: 39.4 % (ref 12.0–46.0)
Lymphs Abs: 1.5 K/uL (ref 0.7–4.0)
MCHC: 32.8 g/dL (ref 30.0–36.0)
MCV: 84.7 fl (ref 78.0–100.0)
Monocytes Absolute: 0.3 K/uL (ref 0.1–1.0)
Monocytes Relative: 7.5 % (ref 3.0–12.0)
Neutro Abs: 1.9 K/uL (ref 1.4–7.7)
Neutrophils Relative %: 48.8 % (ref 43.0–77.0)
Platelets: 220 K/uL (ref 150.0–400.0)
RBC: 5.4 Mil/uL — ABNORMAL HIGH (ref 3.87–5.11)
RDW: 14.9 % (ref 11.5–15.5)
WBC: 3.9 K/uL — ABNORMAL LOW (ref 4.0–10.5)

## 2024-06-21 LAB — BASIC METABOLIC PANEL WITH GFR
BUN: 12 mg/dL (ref 6–23)
CO2: 29 meq/L (ref 19–32)
Calcium: 10 mg/dL (ref 8.4–10.5)
Chloride: 100 meq/L (ref 96–112)
Creatinine, Ser: 0.96 mg/dL (ref 0.40–1.20)
GFR: 66.07 mL/min (ref >60.00)
Glucose, Bld: 74 mg/dL (ref 70–99)
Potassium: 3.4 meq/L — ABNORMAL LOW (ref 3.5–5.1)
Sodium: 140 meq/L (ref 135–145)

## 2024-06-21 LAB — HEPATIC FUNCTION PANEL
ALT: 19 U/L (ref 0–35)
AST: 23 U/L (ref 0–37)
Albumin: 4.9 g/dL (ref 3.5–5.2)
Alkaline Phosphatase: 71 U/L (ref 39–117)
Bilirubin, Direct: 0.1 mg/dL (ref 0.0–0.3)
Total Bilirubin: 0.5 mg/dL (ref 0.2–1.2)
Total Protein: 7.6 g/dL (ref 6.0–8.3)

## 2024-06-21 LAB — VITAMIN D 25 HYDROXY (VIT D DEFICIENCY, FRACTURES): VITD: 28.61 ng/mL — ABNORMAL LOW (ref 30.00–100.00)

## 2024-06-21 LAB — LIPID PANEL
Cholesterol: 189 mg/dL (ref 0–200)
HDL: 88.2 mg/dL (ref >39.00)
LDL Cholesterol: 90 mg/dL (ref 0–99)
NonHDL: 100.55
Total CHOL/HDL Ratio: 2
Triglycerides: 51 mg/dL (ref 0.0–149.0)
VLDL: 10.2 mg/dL (ref 0.0–40.0)

## 2024-06-21 LAB — HEMOGLOBIN A1C: Hgb A1c MFr Bld: 5.5 % (ref 4.6–6.5)

## 2024-06-21 LAB — IBC PANEL
Iron: 97 ug/dL (ref 42–145)
Saturation Ratios: 24.4 % (ref 20.0–50.0)
TIBC: 397.6 ug/dL (ref 250.0–450.0)
Transferrin: 284 mg/dL (ref 212.0–360.0)

## 2024-06-21 LAB — TSH: TSH: 0.98 u[IU]/mL (ref 0.35–5.50)

## 2024-06-21 LAB — FERRITIN: Ferritin: 69.8 ng/mL (ref 10.0–291.0)

## 2024-06-21 NOTE — Patient Instructions (Signed)
 You had the flu shot today  Please continue all other medications as before, and refills have been done if requested.  Please have the pharmacy call with any other refills you may need.  Please continue your efforts at being more active, low cholesterol diet, and weight control.  You are otherwise up to date with prevention measures today.  Please keep your appointments with your specialists as you may have planned  Please go to the LAB at the blood drawing area for the tests to be done  You will be contacted by phone if any changes need to be made immediately.  Otherwise, you will receive a letter about your results with an explanation, but please check with MyChart first.  Please make an Appointment to return in 6 months, or sooner if needed

## 2024-06-21 NOTE — Assessment & Plan Note (Signed)
 Last vitamin D  Lab Results  Component Value Date   VD25OH 28.19 (L) 12/21/2023   Low, to start oral replacement

## 2024-06-21 NOTE — Assessment & Plan Note (Signed)
 At least mild persistent, not depressed, pt reassured

## 2024-06-21 NOTE — Progress Notes (Signed)
 Patient ID: [name], female   DOB: 10-31-1965, 58 y.o.   MRN: [id number]        Chief Complaint: follow up sickle cell trait, low vit d, hld, anxiety       HPI:  [name] is a 58 y.o. female here overall doing ok but has fatigue, recurring Headaches, but no daytime hypersomnolence.  Pt denies chest pain, increased sob or doe, wheezing, orthopnea, PND, increased LE swelling, palpitations, dizziness or syncope.   Pt denies polydipsia, polyuria, or new focal neuro s/s.    Pt denies fever, wt loss, night sweats, loss of appetite, or other constitutional symptoms  Due for flu shot.  Pt recalls she was told by her mother she may have the sickle cell trait, but no testing to prove this.       Wt Readings from Last 3 Encounters:  06/21/24 189 lb (85.7 kg)  04/11/24 187 lb (84.8 kg)  12/21/23 185 lb 2 oz (84 kg)   BP Readings from Last 3 Encounters:  06/21/24 122/78  06/13/24 133/85  04/11/24 122/80         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   DIZZINESS 08/15/2008   DYSPNEA 05/20/2008   FATIGUE 05/20/2008   History of 2019 novel coronavirus disease (COVID-19) 05/23/2019   History of hiatal hernia    HSV infection    Hyperplastic colon polyp    KNEE PAIN, LEFT 09/11/2007   Left sided sciatica 04/19/2011   Lumbar herniated disc    Lynch syndrome    MIGRAINE HEADACHE 04/09/2007   Ovarian cyst    Palpitations 05/20/2008   Pancreatic lesion    PONV (postoperative nausea and vomiting)    PREMATURE VENTRICULAR CONTRACTIONS 08/23/2008   RASH-NONVESICULAR 01/17/2008   RUQ PAIN 02/23/2010   SICKLE CELL TRAIT  04/09/2007   Tension headache 04/09/2007   VARICOSE VEINS LOWER EXTREMITIES W/OTH COMPS 09/11/2007   Wears glasses    Past Surgical History:  Procedure Laterality Date   CESAREAN SECTION  2004   COLONOSCOPY  2016   FINGER FRACTURE SURGERY Left 03/2019   LAPAROSCOPIC ASSISTED VAGINAL HYSTERECTOMY N/A 10/23/2012   Procedure: LAPAROSCOPIC ASSISTED VAGINAL HYSTERECTOMY;  Surgeon: Charlie CHRISTELLA Croak, MD;  Location: [facility];  Service: Gynecology;  Laterality: N/A;   TONSILLECTOMY  07/18/2009   TUBAL LIGATION  01/06/2012   with cyst removal   UPPER GASTROINTESTINAL ENDOSCOPY      reports that she has never smoked. She has never used smokeless tobacco. She reports current alcohol use. She reports that she does not use drugs. family history includes Brain cancer in her maternal aunt; Cancer in her cousin and mother; Heart attack in her father; Heart disease in her mother; Hypertension in her mother; Liver cancer in her maternal aunt; Lung cancer in her maternal aunt; Pancreatitis in her father. Allergies  Allergen Reactions   Morphine  Hives   Current Outpatient Medications on File Prior to Visit  Medication Sig Dispense Refill   atovaquone-proguanil (MALARONE) 250-100 MG TABS tablet 1 TAB DAILY STARTING 2 DAYS PRIOR TO TRIP, DAILY WHILE ON TRIP, AND FOR 7 DAYS  WHEN RETURN (Patient not taking: Reported on 06/21/2024)     No current facility-administered medications on file prior to visit.        ROS:  All others reviewed and negative.  Objective        PE:  BP 122/78 (BP Location: Right Arm, Patient Position: Sitting, Cuff Size: Normal)   Pulse 73   Temp 97.8 F (36.6 C) (Oral)   Ht 5' 5 (1.651 m)   Wt 189 lb (85.7 kg)   LMP 10/05/2012   SpO2 99%   BMI 31.45 kg/m                 Constitutional: Pt appears in NAD               HENT: Head: NCAT.                Right Ear: External ear normal.                 Left Ear: External ear normal.                Eyes: . Pupils are equal, round,  and reactive to light. Conjunctivae and EOM are normal               Nose: without d/c or deformity               Neck: Neck supple. Gross normal ROM               Cardiovascular: Normal rate and regular rhythm.                 Pulmonary/Chest: Effort normal and breath sounds without rales or wheezing.                Abd:  Soft, NT, ND, + BS, no organomegaly               Neurological: Pt is alert. At baseline orientation, motor grossly intact               Skin: Skin is warm. No rashes, no other new lesions, LE edema - none               Psychiatric: Pt behavior is normal without agitation   Micro: none  Cardiac tracings I have personally interpreted today:  none  Pertinent Radiological findings (summarize): none   Lab Results  Component Value Date   WBC 4.5 12/21/2023   HGB 14.0 12/21/2023   HCT 42.8 12/21/2023   PLT 259.0 12/21/2023   GLUCOSE 66 (L) 04/12/2024   CHOL 181 12/21/2023   TRIG 70.0 12/21/2023   HDL 74.90 12/21/2023   LDLCALC 92 12/21/2023   ALT 19 04/12/2024   AST 19 04/12/2024   NA 142 04/12/2024   K 3.8 04/12/2024   CL 98 04/12/2024   CREATININE 1.13 (H) 04/12/2024   BUN 16 04/12/2024   CO2 19 (L) 04/12/2024   TSH 0.95 12/21/2023   INR 1.0 03/01/2007   HGBA1C 5.7 12/21/2023   Assessment/Plan:  [name] is a 58 y.o. Black or African American [2] female with  has a past medical history of ACUTE NASOPHARYNGITIS (05/07/2010), Allergic rhinitis, cause unspecified (04/19/2011), Allergy, Anemia, BACK PAIN (02/05/2010), CELLULITIS (01/13/2010), CHEST PAIN UNSPECIFIED (08/23/2008), CONSTIPATION, CHRONIC (04/09/2007), DEEP VENOUS THROMBOPHLEBITIS, LEFT, LEG, HX OF (04/09/2007), DEGENERATIVE DISC DISEASE, LUMBAR SPINE (05/12/2007), DIZZINESS (08/15/2008), DYSPNEA (05/20/2008), FATIGUE (05/20/2008), History of 2019 novel coronavirus disease (COVID-19) (05/23/2019), History of hiatal hernia, HSV  infection, Hyperplastic colon polyp, KNEE PAIN, LEFT (09/11/2007), Left  sided sciatica (04/19/2011), Lumbar herniated disc, Lynch syndrome, MIGRAINE HEADACHE (04/09/2007), Ovarian cyst, Palpitations (05/20/2008), Pancreatic lesion, PONV (postoperative nausea and vomiting), PREMATURE VENTRICULAR CONTRACTIONS (08/23/2008), RASH-NONVESICULAR (01/17/2008), RUQ PAIN (02/23/2010), SICKLE CELL TRAIT (04/09/2007), Tension headache (04/09/2007), VARICOSE VEINS LOWER EXTREMITIES W/OTH COMPS (09/11/2007), and Wears glasses.  SICKLE CELL TRAIT Possible per pt - for Hgb electrophoresis to further evaluate  Vitamin D  deficiency Last vitamin D  Lab Results  Component Value Date   VD25OH 28.19 (L) 12/21/2023   Low, to start oral replacement   HLD (hyperlipidemia) Lab Results  Component Value Date   LDLCALC 92 12/21/2023   Stable, pt to continue lower chol diet   Anxiety At least mild persistent, not depressed, pt reassured  Migraine headache Pt declines tx such as imitrex prn,  to f/u any worsening symptoms or concerns  Followup: Return in about 6 months (around 12/20/2024).  Lynwood Rush, MD 06/21/2024 12:21 PM [facility]

## 2024-06-21 NOTE — Assessment & Plan Note (Signed)
 Lab Results  Component Value Date   LDLCALC 92 12/21/2023   Stable, pt to continue lower chol diet

## 2024-06-21 NOTE — Assessment & Plan Note (Signed)
 Possible per pt - for Hgb electrophoresis to further evaluate

## 2024-06-21 NOTE — Assessment & Plan Note (Signed)
 Pt declines tx such as imitrex prn,  to f/u any worsening symptoms or concerns

## 2024-06-23 ENCOUNTER — Ambulatory Visit: Admission: EM | Admit: 2024-06-23 | Discharge: 2024-06-23 | Disposition: A | Discharge status: Home / Self Care

## 2024-06-23 ENCOUNTER — Ambulatory Visit

## 2024-06-23 NOTE — ED Triage Notes (Signed)
 Pt presents for suture removal from left finger. Denies any complications.

## 2024-06-24 LAB — HGB FRACTIONATION CASCADE
Hgb A2: 3.1 % (ref 1.8–3.2)
Hgb A: 61.1 % — ABNORMAL LOW (ref 96.4–98.8)
Hgb F: 0 % (ref 0.0–2.0)
Hgb S: 35.8 % — ABNORMAL HIGH

## 2024-06-24 LAB — HGB SOLUBILITY: Hgb Solubility: POSITIVE — AB

## 2024-06-26 NOTE — Progress Notes (Deleted)
 Chief Complaint: Primary GI MD: Dr. Albertus  HPI:  *** is a  ***  who was referred to me by Norleen Lynwood ORN, MD for a complaint of *** .     Discussed the use of AI scribe software for clinical note transcription with the patient, who gave verbal consent to proceed.  History of Present Illness      PREVIOUS GI WORKUP   Colonoscopy 09/2023 - Normal - Repeat 2 years  EGD 3/20 - Normal esophagus.  - 2 cm hiatal hernia.  - Normal stomach.  - Normal examined duodenum.  - No specimens collected. - Repeat 3 years  EGD 07/2022 - Normal esophagus.  - 2 cm hiatal hernia.  - Non- bleeding gastric ulcer with no stigmata of bleeding. Biopsied.  - Normal examined duodenum.  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   DIZZINESS 08/15/2008   DYSPNEA 05/20/2008   FATIGUE 05/20/2008   History of 2019 novel coronavirus disease (COVID-19) 05/23/2019   History of hiatal hernia    HSV infection    Hyperplastic colon polyp    KNEE PAIN, LEFT 09/11/2007   Left sided sciatica 04/19/2011   Lumbar herniated disc    Lynch syndrome    MIGRAINE HEADACHE 04/09/2007   Ovarian cyst    Palpitations 05/20/2008   Pancreatic lesion    PONV (postoperative nausea and vomiting)    PREMATURE VENTRICULAR CONTRACTIONS 08/23/2008   RASH-NONVESICULAR 01/17/2008   RUQ PAIN 02/23/2010   SICKLE CELL TRAIT 04/09/2007   Tension headache 04/09/2007   VARICOSE VEINS LOWER EXTREMITIES W/OTH COMPS 09/11/2007   Wears glasses     Past Surgical History:  Procedure Laterality Date   CESAREAN SECTION  2004   COLONOSCOPY  2016   FINGER  FRACTURE SURGERY Left 03/2019   LAPAROSCOPIC ASSISTED VAGINAL HYSTERECTOMY N/A 10/23/2012   Procedure: LAPAROSCOPIC ASSISTED VAGINAL HYSTERECTOMY;  Surgeon: Charlie CHRISTELLA Croak, MD;  Location: [facility];  Service: Gynecology;  Laterality: N/A;   TONSILLECTOMY  07/18/2009   TUBAL LIGATION  01/06/2012   with cyst removal   UPPER GASTROINTESTINAL ENDOSCOPY      Current Outpatient Medications  Medication Sig Dispense Refill   atovaquone-proguanil (MALARONE) 250-100 MG TABS tablet 1 TAB DAILY STARTING 2 DAYS PRIOR TO TRIP, DAILY WHILE ON TRIP, AND FOR 7 DAYS WHEN RETURN (Patient not taking: Reported on 06/21/2024)     No current facility-administered medications for this visit.    Allergies as of 06/27/2024 - Review Complete 06/23/2024  Allergen Reaction Noted   Morphine  Hives 10/21/2014    Family History  Problem Relation Age of Onset   Heart disease Mother    Hypertension Mother    Cancer Mother        ovarian    Heart attack Father    Pancreatitis Father    Liver cancer Maternal Aunt    Brain cancer Maternal Aunt    Lung cancer Maternal Aunt    Cancer Cousin        breast   Colon cancer Neg Hx    Colon polyps Neg Hx    Esophageal cancer Neg Hx  Rectal cancer Neg Hx    Stomach cancer Neg Hx     Social History   Socioeconomic History   Marital status: Married    Spouse name: Not on file   Number of children: 3   Years of education: Not on file   Highest education level: Some college, no degree  Occupational History   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     Employer: [employer]   Occupation: currently does not work outside the home (12/06/19)  Tobacco Use   Smoking status: Never   Smokeless tobacco: Never  Vaping Use   Vaping status: Never Used  Substance and Sexual Activity   Alcohol use: Yes    Comment: socially rare every 1-2 months   Drug use: No   Sexual activity: Not on file  Other Topics Concern   Not on file  Social History Narrative   Not  on file   Social Drivers of Health   Financial Resource Strain: Low Risk  (06/20/2024)   Overall Financial Resource Strain (CARDIA)    Difficulty of Paying Living Expenses: Not hard at all  Food Insecurity: No Food Insecurity (06/20/2024)   Hunger Vital Sign    Worried About Running Out of Food in the Last Year: Never true    Ran Out of Food in the Last Year: Never true  Transportation Needs: No Transportation Needs (06/20/2024)   PRAPARE - Administrator, Civil Service (Medical): No    Lack of Transportation (Non-Medical): No  Physical Activity: Sufficiently Active (06/20/2024)   Exercise Vital Sign    Days of Exercise per Week: 5 days    Minutes of Exercise per Session: 60 min  Stress: No Stress Concern Present (06/20/2024)   [employer] of Occupational Health - Occupational Stress Questionnaire    Feeling of Stress: Not at all  Social Connections: Moderately Isolated (06/20/2024)   Social Connection and Isolation Panel    Frequency of Communication with Friends and Family: More than three times a week    Frequency of Social Gatherings with Friends and Family: More than three times a week    Attends Religious Services: Patient declined    Database Administrator or Organizations: No    Attends Engineer, Structural: Not on file    Marital Status: Married  Catering Manager Violence: Not At Risk (01/14/2024)   Received from [facility]   HITS    Over the last 12 months how often did your partner physically hurt you?: Never    Over the last 12 months how often did your partner insult you or talk down to you?: Never    Over the last 12 months how often did your partner threaten you with physical harm?: Never    Over the last 12 months how often did your partner scream or curse at you?: Never    Review of Systems:    Constitutional: No weight loss, fever, chills, weakness or fatigue HEENT: Eyes: No change in vision               Ears, Nose, Throat:  No change  in hearing or congestion Skin: No rash or itching Cardiovascular: No chest pain, chest pressure or palpitations   Respiratory: No SOB or cough Gastrointestinal: See HPI and otherwise negative Genitourinary: No dysuria or change in urinary frequency Neurological: No headache, dizziness or syncope Musculoskeletal: No new muscle or joint pain Hematologic: No bleeding or bruising Psychiatric: No history of depression or anxiety  Physical Exam:  Vital signs: LMP 10/05/2012   Constitutional: NAD, alert and cooperative Head:  Normocephalic and atraumatic. Eyes:   PEERL, EOMI. No icterus. Conjunctiva pink. Respiratory: Respirations even and unlabored. Lungs clear to auscultation bilaterally.   No wheezes, crackles, or rhonchi.  Cardiovascular:  Regular rate and rhythm. No peripheral edema, cyanosis or pallor.  Gastrointestinal:  Soft, nondistended, nontender. No rebound or guarding. Normal bowel sounds. No appreciable masses or hepatomegaly. Rectal:  Declines Msk:  Symmetrical without gross deformities. Without edema, no deformity or joint abnormality.  Neurologic:  Alert and  oriented x4;  grossly normal neurologically.  Skin:   Dry and intact without significant lesions or rashes. Psychiatric: Oriented to person, place and time. Demonstrates good judgement and reason without abnormal affect or behaviors.  Physical Exam    RELEVANT LABS AND IMAGING: CBC    Component Value Date/Time   WBC 3.9 (L) 06/21/2024 1147   RBC 5.40 (H) 06/21/2024 1147   HGB 15.0 06/21/2024 1147   HCT 45.7 06/21/2024 1147   PLT 220.0 06/21/2024 1147   MCV 84.7 06/21/2024 1147   MCH 28.5 11/25/2022 0540   MCHC 32.8 06/21/2024 1147   RDW 14.9 06/21/2024 1147   LYMPHSABS 1.5 06/21/2024 1147   MONOABS 0.3 06/21/2024 1147   EOSABS 0.1 06/21/2024 1147   BASOSABS 0.0 06/21/2024 1147    CMP     Component Value Date/Time   NA 140 06/21/2024 1147   NA 142 04/12/2024 1426   K 3.4 (L) 06/21/2024 1147    CL 100 06/21/2024 1147   CO2 29 06/21/2024 1147   GLUCOSE 74 06/21/2024 1147   BUN 12 06/21/2024 1147   BUN 16 04/12/2024 1426   CREATININE 0.96 06/21/2024 1147   CALCIUM 10.0 06/21/2024 1147   PROT 7.6 06/21/2024 1147   PROT 8.5 04/12/2024 1426   ALBUMIN 4.9 06/21/2024 1147   ALBUMIN 5.1 (H) 04/12/2024 1426   AST 23 06/21/2024 1147   ALT 19 06/21/2024 1147   ALKPHOS 71 06/21/2024 1147   BILITOT 0.5 06/21/2024 1147   BILITOT 0.4 04/12/2024 1426   GFRNONAA 57 (L) 11/25/2022 0533   GFRAA >60 07/24/2018 1116     Assessment/Plan:   58 year old female with a history of Lynch syndrome, partial hysterectomy, GERD with hiatal hernia, small gastric ulcer (Jan 2024 without H. Pylori), constipation, anxiety, sickle cell trait who is here for follow-up.    GERD/dyspepsia Uncontrolled symptoms 05/2023 with EGD 09/2023 showing 2 cm hiatal hernia, otherwise unrevealing.  Put on PPI twice daily  Chronic constipation Linzess  previously caused bloating.  Put on Trulance  3 Mg at last office visit  Lynch syndrome Colonoscopy 09/2023 was normal, EGD 09/2023 with 2 cm hiatal hernia otherwise normal - Repeat colonoscopy 09/2025 - Repeat EGD 09/2026  Nestor Blower, PA-C [facility] 06/26/2024, 9:53 AM  Cc: Norleen Lynwood ORN, MD

## 2024-06-27 ENCOUNTER — Ambulatory Visit: Admitting: Gastroenterology

## 2024-07-16 ENCOUNTER — Ambulatory Visit: Admitting: Gastroenterology

## 2024-07-25 ENCOUNTER — Ambulatory Visit: Admitting: Internal Medicine

## 2024-08-07 ENCOUNTER — Ambulatory Visit: Admitting: Gastroenterology

## 2024-08-15 NOTE — Progress Notes (Signed)
 "  Chief Complaint: GERD, bloating Primary GI MD: Dr. Albertus  HPI: Discussed the use of AI scribe software for clinical note transcription with the patient, who gave verbal consent to proceed.  History of Present Illness   [name] is a 59 year old female with hiatal hernia and gastric ulcer who presents for evaluation of persistent bloating and refractory gastroesophageal reflux symptoms.  Persistent bloating is her predominant symptom, described as a sensation of fullness after eating even small amounts, such as a salad or a piece of fruit. The bloating is present most of the time and causes significant discomfort. She occasionally uses fiber supplements in the form of a cookie in the morning and has tried Metamucil, which she notes can contribute to bloating.  She continues to experience intermittent heartburn and globus sensation, which have improved but still occur occasionally. Frequent throat clearing is a new symptom. She has noticed it recently and thinks it may be related to her reflux symptoms. History includes chest pressure, trouble swallowing, and globus sensation.  Previous workup included an upper endoscopy revealing a hiatal hernia. She previously trialed pantoprazole  twice daily without improvement. Dexilant  60 mg once daily was prescribed but not taken due to insurance cost. She is currently taking over-the-counter omeprazole  20 mg, typically twice daily but not every day. She has attempted to avoid gas-causing foods and reduce coffee intake, though finds this challenging.  Bowel movements are normal, occurring at least three times per week, with no current constipation. She feels able to evacuate completely. If stools are not soft, she occasionally takes a laxative or Metamucil. She is not currently taking Trulance  and does not struggle with constipation at this time.  She has noticed a small, non-tender knot in her lower chest/rib area, first noted while lying in bed,  possibly present on both sides, without associated pain or tenderness.  PREVIOUS GI WORKUP   EGD 09/2023 - The examined esophagus was normal.  - A 2 cm hiatal hernia was present.  - The entire examined stomach was normal.  - The examined duodenum was normal.  Colonoscopy 09/2023 - The examined portion of the ileum was normal.  - The entire examined colon is normal on direct and retroflexion views.  - No specimens collected.  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   DIZZINESS 08/15/2008   DYSPNEA 05/20/2008   FATIGUE 05/20/2008   GERD (gastroesophageal reflux disease)    History of 2019 novel coronavirus disease (COVID-19) 05/23/2019   History of hiatal hernia    HSV infection    Hyperplastic colon polyp    KNEE PAIN, LEFT 09/11/2007   Left sided sciatica 04/19/2011   Lumbar herniated disc    Lynch syndrome    MIGRAINE HEADACHE 04/09/2007   Ovarian cyst    Palpitations 05/20/2008   Pancreatic lesion    PONV (postoperative nausea and vomiting)    PREMATURE VENTRICULAR CONTRACTIONS 08/23/2008   RASH-NONVESICULAR 01/17/2008   RUQ PAIN 02/23/2010   SICKLE CELL TRAIT 04/09/2007   Tension headache 04/09/2007   VARICOSE VEINS LOWER EXTREMITIES W/OTH COMPS 09/11/2007   Wears glasses  Past Surgical History:  Procedure Laterality Date   CESAREAN SECTION  2004   COLONOSCOPY  2016   FINGER FRACTURE SURGERY Left 03/2019   LAPAROSCOPIC ASSISTED VAGINAL HYSTERECTOMY N/A 10/23/2012   Procedure: LAPAROSCOPIC ASSISTED VAGINAL HYSTERECTOMY;  Surgeon: Charlie CHRISTELLA Croak, MD;  Location: [facility];  Service: Gynecology;   Laterality: N/A;   TONSILLECTOMY  07/18/2009   TUBAL LIGATION  01/06/2012   with cyst removal   UPPER GASTROINTESTINAL ENDOSCOPY      Current Outpatient Medications  Medication Sig Dispense Refill   diclofenac (VOLTAREN) 75 MG EC tablet Take 75 mg by mouth 2 (two) times daily.     Vonoprazan Fumarate  (VOQUEZNA ) 10 MG TABS Take 1 tablet by mouth daily before breakfast. 30 tablet 1   atovaquone-proguanil (MALARONE) 250-100 MG TABS tablet 1 TAB DAILY STARTING 2 DAYS PRIOR TO TRIP, DAILY WHILE ON TRIP, AND FOR 7 DAYS WHEN RETURN (Patient not taking: Reported on 08/17/2024)     No current facility-administered medications for this visit.    Allergies as of 08/17/2024 - Review Complete 08/17/2024  Allergen Reaction Noted   Morphine  Hives 10/21/2014    Family History  Problem Relation Age of Onset   Heart disease Mother    Hypertension Mother    Cancer Mother        ovarian    Heart attack Father    Pancreatitis Father    Liver cancer Maternal Aunt    Brain cancer Maternal Aunt    Lung cancer Maternal Aunt    Cancer Cousin        breast   Colon cancer Neg Hx    Colon polyps Neg Hx    Esophageal cancer Neg Hx    Rectal cancer Neg Hx    Stomach cancer Neg Hx     Social History   Socioeconomic History   Marital status: Married    Spouse name: Not on file   Number of children: 3   Years of education: Not on file   Highest education level: Some college, no degree  Occupational History   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     Employer: [employer]   Occupation: currently does not work outside the home (12/06/19)  Tobacco Use   Smoking status: Never   Smokeless tobacco: Never  Vaping Use   Vaping status: Never Used  Substance and Sexual Activity   Alcohol use: Yes    Comment: socially rare every 1-2 months   Drug use: No   Sexual activity: Not on file  Other Topics Concern   Not on file  Social History Narrative   Not on file   Social Drivers of  Health   Tobacco Use: Low Risk (08/17/2024)   Patient History    Smoking Tobacco Use: Never    Smokeless Tobacco Use: Never    Passive Exposure: Not on file  Financial Resource Strain: Low Risk (06/20/2024)   Overall Financial Resource Strain (CARDIA)    Difficulty of Paying Living Expenses: Not hard at all  Food Insecurity: No Food Insecurity (06/20/2024)   Epic    Worried About Radiation Protection Practitioner of Food in the Last Year: Never true    Ran Out of Food in the Last Year: Never true  Transportation Needs: No Transportation Needs (06/20/2024)   Epic    Lack of Transportation (Medical): No    Lack of Transportation (Non-Medical): No  Physical Activity: Sufficiently Active (06/20/2024)   Exercise Vital Sign    Days of Exercise per  Week: 5 days    Minutes of Exercise per Session: 60 min  Stress: No Stress Concern Present (06/20/2024)   [employer] of Occupational Health - Occupational Stress Questionnaire    Feeling of Stress: Not at all  Social Connections: Moderately Isolated (06/20/2024)   Social Connection and Isolation Panel    Frequency of Communication with Friends and Family: More than three times a week    Frequency of Social Gatherings with Friends and Family: More than three times a week    Attends Religious Services: Patient declined    Database Administrator or Organizations: No    Attends Engineer, Structural: Not on file    Marital Status: Married  Catering Manager Violence: Not At Risk (01/14/2024)   Received from [facility]   HITS    Over the last 12 months how often did your partner physically hurt you?: Never    Over the last 12 months how often did your partner insult you or talk down to you?: Never    Over the last 12 months how often did your partner threaten you with physical harm?: Never    Over the last 12 months how often did your partner scream or curse at you?: Never  Depression (PHQ2-9): Low Risk (06/21/2024)   Depression (PHQ2-9)    PHQ-2 Score: 0   Alcohol Screen: Low Risk (06/20/2023)   Alcohol Screen    Last Alcohol Screening Score (AUDIT): 1  Housing: Unknown (06/20/2024)   Epic    Unable to Pay for Housing in the Last Year: No    Number of Times Moved in the Last Year: Not on file    Homeless in the Last Year: No  Utilities: Not At Risk (01/14/2024)   Received from [facility] Utilities    Threatened with loss of utilities: No  Health Literacy: Not on file    Review of Systems:    Constitutional: No weight loss, fever, chills, weakness or fatigue HEENT: Eyes: No change in vision               Ears, Nose, Throat:  No change in hearing or congestion Skin: No rash or itching Cardiovascular: No chest pain, chest pressure or palpitations   Respiratory: No SOB or cough Gastrointestinal: See HPI and otherwise negative Genitourinary: No dysuria or change in urinary frequency Neurological: No headache, dizziness or syncope Musculoskeletal: No new muscle or joint pain Hematologic: No bleeding or bruising Psychiatric: No history of depression or anxiety    Physical Exam:  Vital signs: BP 122/72   Pulse 85   Ht 5' 5 (1.651 m)   Wt 184 lb 4 oz (83.6 kg)   LMP 10/05/2012   BMI 30.66 kg/m   Constitutional: NAD, alert and cooperative Head:  Normocephalic and atraumatic. Eyes:   PEERL, EOMI. No icterus. Conjunctiva pink. Respiratory: Respirations even and unlabored. Lungs clear to auscultation bilaterally.   No wheezes, crackles, or rhonchi.  Cardiovascular:  Regular rate and rhythm. No peripheral edema, cyanosis or pallor.  Gastrointestinal:  Soft, nondistended, nontender. No rebound or guarding. Normal bowel sounds. No appreciable masses or hepatomegaly.  No knots palpated.   Rectal:  Declines Msk:  Symmetrical without gross deformities. Without edema, no deformity or joint abnormality.  Neurologic:  Alert and  oriented x4;  grossly normal neurologically.  Skin:   Dry and intact without significant lesions or  rashes. Psychiatric: Oriented to person, place and time. Demonstrates good judgement and reason without abnormal  affect or behaviors.  Physical Exam    RELEVANT LABS AND IMAGING: CBC    Component Value Date/Time   WBC 3.9 (L) 06/21/2024 1147   RBC 5.40 (H) 06/21/2024 1147   HGB 15.0 06/21/2024 1147   HCT 45.7 06/21/2024 1147   PLT 220.0 06/21/2024 1147   MCV 84.7 06/21/2024 1147   MCH 28.5 11/25/2022 0540   MCHC 32.8 06/21/2024 1147   RDW 14.9 06/21/2024 1147   LYMPHSABS 1.5 06/21/2024 1147   MONOABS 0.3 06/21/2024 1147   EOSABS 0.1 06/21/2024 1147   BASOSABS 0.0 06/21/2024 1147    CMP     Component Value Date/Time   NA 140 06/21/2024 1147   NA 142 04/12/2024 1426   K 3.4 (L) 06/21/2024 1147   CL 100 06/21/2024 1147   CO2 29 06/21/2024 1147   GLUCOSE 74 06/21/2024 1147   BUN 12 06/21/2024 1147   BUN 16 04/12/2024 1426   CREATININE 0.96 06/21/2024 1147   CALCIUM 10.0 06/21/2024 1147   PROT 7.6 06/21/2024 1147   PROT 8.5 04/12/2024 1426   ALBUMIN 4.9 06/21/2024 1147   ALBUMIN 5.1 (H) 04/12/2024 1426   AST 23 06/21/2024 1147   ALT 19 06/21/2024 1147   ALKPHOS 71 06/21/2024 1147   BILITOT 0.5 06/21/2024 1147   BILITOT 0.4 04/12/2024 1426   GFRNONAA 57 (L) 11/25/2022 0533   GFRAA >60 07/24/2018 1116     Assessment/Plan:   59 year old female with a history of Lynch syndrome, partial hysterectomy, GERD with hiatal hernia, small gastric ulcer (Jan 2024 without H. Pylori), constipation, anxiety, sickle cell trait who is here for follow-up.    GERD Dyspepsia Symptoms characterized by globus/chest pressure, throat clearing. Failed twice daily pantoprazole , failed omeprazole , and Dexilant  was too expensive, EGD 09/2023 with 2cm hiatal hernia otherwise normal. Negative CXR. - Trial of Voquezna  10 mg once daily as she has failed all other PPIs - Educated patient on lifestyle modifications and provided patient education handouts - Follow-up with me 8  weeks  Bloating Severe bloating with eating.  Possible from her dyspepsia.  No longer constipated per report. - Low FODMAP diet - SIBO breath test - Offered empiric treatment for SIBO, patient would like to pursue breath testing first  Chronic constipation Linzess  caused bloating, was on Trulance  3 Mg once daily.  No longer on a bowel regimen as she feels she is no longer having constipation and controls intermittent constipation with fiber as needed - Continue to increase fiber, increase water , increase exercise  Lynch Syndrome EGD/Colonoscopy 09/2023 with repeat in 2 years -- repeat 09/2025  Knot on LUQ   Nestor Blower, PA-C [facility] 08/17/2024, 10:39 AM  Cc: Norleen Lynwood ORN, MD "

## 2024-08-17 ENCOUNTER — Ambulatory Visit: Admitting: Gastroenterology

## 2024-08-17 ENCOUNTER — Encounter: Payer: Self-pay | Admitting: Gastroenterology

## 2024-08-17 VITALS — BP 122/72 | HR 85 | Ht 65.0 in | Wt 184.2 lb

## 2024-08-17 DIAGNOSIS — R1013 Epigastric pain: Secondary | ICD-10-CM | POA: Diagnosis not present

## 2024-08-17 DIAGNOSIS — R140 Abdominal distension (gaseous): Secondary | ICD-10-CM | POA: Diagnosis not present

## 2024-08-17 DIAGNOSIS — Z15068 Genetic susceptibility to other malignant neoplasm of digestive system: Secondary | ICD-10-CM

## 2024-08-17 DIAGNOSIS — K219 Gastro-esophageal reflux disease without esophagitis: Secondary | ICD-10-CM

## 2024-08-17 DIAGNOSIS — K5909 Other constipation: Secondary | ICD-10-CM

## 2024-08-17 DIAGNOSIS — K5904 Chronic idiopathic constipation: Secondary | ICD-10-CM

## 2024-08-17 DIAGNOSIS — R0789 Other chest pain: Secondary | ICD-10-CM

## 2024-08-17 MED ORDER — VOQUEZNA 10 MG PO TABS: 1.0000 | ORAL_TABLET | Freq: Every day | ORAL | 1 refills | Status: AC

## 2024-08-17 NOTE — Patient Instructions (Addendum)
 You have been given a testing kit to check for small intestine bacterial overgrowth (SIBO) which is completed by a company named [facility]. Make sure to return your test in the mail using the return mailing label given to you along with the kit. The test order, your demographic and insurance information have all already been sent to the company. [facility] will collect an upfront charge of $109.00 for commercial insurance plans and $229.00 if you are paying cash. The potential remaining total after claim submission and review is $120.00. Make sure to discuss with [facility] PRIOR to having the test to see if they have gotten information from your insurance company as to how much your testing will cost out of pocket, if any. Please contact [facility] at phone number [phone] to get instructions regarding how to perform the test as our office is unable to give specific testing instructions.    We have sent the following prescriptions to your mail in pharmacy: [facility]   If you have not heard from your mail in pharmacy within 1 week or if you have not received your medication in the mail, please contact us  at [phone] so we may find out why.   I appreciate the opportunity to care for you. Nestor Blower, PA   What is SIBO? Small intestinal bacterial overgrowth (SIBO) is a condition where too many bacteria grow in your small intestine. Normally, your small intestine has relatively few bacteria compared to your large intestine (colon). When bacteria from the colon move into or multiply in the small intestine, they can cause uncomfortable symptoms and interfere with how your body absorbs nutrients. [1-2]  What Are the Symptoms? The most common symptoms of SIBO include: [1][3]  Bloating and gas  Abdominal pain or cramping  Diarrhea (this may be the most important symptom)  Abdominal distension (belly swelling)  Nausea  Constipation  Some people may also  experience: [1][4]  Fatigue and poor concentration  Weight loss  Vitamin deficiencies (especially B12, vitamin D , iron)  In severe cases, greasy stools (steatorrhea)  These symptoms can overlap with other digestive conditions like irritable bowel syndrome (IBS), which is why proper diagnosis is important.  How is SIBO Diagnosed? Your doctor may use one of several methods to diagnose SIBO: [2]  Breath testing: This is the most common test. You drink a sugar solution (glucose or lactulose), and your breath is tested over several hours to measure gases produced by bacteria.  Small bowel fluid sample: This is considered the most accurate test but is more invasive. A sample of fluid from your small intestine is collected and tested for bacteria.  How is SIBO Treated? Antibiotics  Antibiotics are the main treatment for SIBO. The goal is not to eliminate all bacteria but to reduce them to healthier levels. [1][3] Common antibiotics used include:  Rifaximin (most studied, though expensive)  Ciprofloxacin  Metronidazole  Amoxicillin-clavulanic acid  Neomycin (especially for methane-producing bacteria)  Treatment typically lasts 7-10 days and improves symptoms in about 46-90% of patients. [3] However, SIBO can come back in some people, with recurrence rates of about 12% at 3 months and 44% at 9 months. [1]  Dietary Changes  While research is still ongoing, some dietary approaches may help manage symptoms: [1][5]  Low FODMAP diet: This diet reduces fermentable carbohydrates that bacteria feed on. FODMAPs include certain fruits, vegetables, grains, and sweeteners. This diet may help reduce gas and bloating.  Avoiding certain foods: Limit high-fiber foods, alcohol sugars, and artificial sweeteners like sucralose during  treatment.  Prebiotics: Avoid prebiotic supplements (like inulin) during active SIBO, as these feed bacteria.  It's important to work with a dietitian when  trying dietary changes, as overly restrictive diets can lead to nutritional deficiencies.  Nutritional Support  If you have vitamin or mineral deficiencies, your doctor may recommend supplements to correct these. [3]  Treating Underlying Causes  Your doctor will also look for and treat any underlying conditions that may have contributed to SIBO, such as: [3]  Structural problems in the intestine  Slow intestinal movement  Certain medications (like proton pump inhibitors)  Other medical conditions (diabetes, scleroderma, etc.)  What Can You Expect? Many people improve with treatment, though symptoms may return and require additional courses of antibiotics. Working closely with your healthcare team to identify triggers, manage underlying conditions, and maintain good nutrition is key to long-term management. [1][3]  When to Contact Your Doctor Contact your doctor if:  Your symptoms don't improve after treatment  You experience severe diarrhea, significant weight loss, or signs of malnutrition  Your symptoms return after initial improvement  You develop new or worsening symptoms
